# Patient Record
Sex: Male | Born: 1935 | Race: White | Hispanic: No | Marital: Married | Smoking: Former smoker
Health system: Southern US, Community
[De-identification: ages and names within clinical notes are randomized; demographics above are authoritative.]

## PROBLEM LIST (undated history)

## (undated) DIAGNOSIS — H9190 Unspecified hearing loss, unspecified ear: Secondary | ICD-10-CM

## (undated) DIAGNOSIS — F329 Major depressive disorder, single episode, unspecified: Secondary | ICD-10-CM

## (undated) DIAGNOSIS — H3530 Unspecified macular degeneration: Secondary | ICD-10-CM

## (undated) DIAGNOSIS — K2270 Barrett's esophagus without dysplasia: Secondary | ICD-10-CM

## (undated) DIAGNOSIS — K219 Gastro-esophageal reflux disease without esophagitis: Secondary | ICD-10-CM

## (undated) DIAGNOSIS — N400 Enlarged prostate without lower urinary tract symptoms: Secondary | ICD-10-CM

## (undated) DIAGNOSIS — G63 Polyneuropathy in diseases classified elsewhere: Secondary | ICD-10-CM

## (undated) DIAGNOSIS — S82202A Unspecified fracture of shaft of left tibia, initial encounter for closed fracture: Secondary | ICD-10-CM

## (undated) DIAGNOSIS — F419 Anxiety disorder, unspecified: Secondary | ICD-10-CM

## (undated) DIAGNOSIS — I714 Abdominal aortic aneurysm, without rupture, unspecified: Secondary | ICD-10-CM

## (undated) DIAGNOSIS — E785 Hyperlipidemia, unspecified: Secondary | ICD-10-CM

## (undated) DIAGNOSIS — M1990 Unspecified osteoarthritis, unspecified site: Secondary | ICD-10-CM

## (undated) DIAGNOSIS — I4891 Unspecified atrial fibrillation: Secondary | ICD-10-CM

## (undated) DIAGNOSIS — F32A Depression, unspecified: Secondary | ICD-10-CM

## (undated) DIAGNOSIS — Z87820 Personal history of traumatic brain injury: Secondary | ICD-10-CM

## (undated) DIAGNOSIS — R269 Unspecified abnormalities of gait and mobility: Secondary | ICD-10-CM

## (undated) DIAGNOSIS — G454 Transient global amnesia: Secondary | ICD-10-CM

## (undated) DIAGNOSIS — M4800 Spinal stenosis, site unspecified: Secondary | ICD-10-CM

## (undated) DIAGNOSIS — I10 Essential (primary) hypertension: Secondary | ICD-10-CM

## (undated) DIAGNOSIS — R42 Dizziness and giddiness: Secondary | ICD-10-CM

## (undated) DIAGNOSIS — S42001A Fracture of unspecified part of right clavicle, initial encounter for closed fracture: Secondary | ICD-10-CM

## (undated) HISTORY — PX: REFRACTIVE SURGERY: SHX103

## (undated) HISTORY — DX: Spinal stenosis, site unspecified: M48.00

## (undated) HISTORY — DX: Unspecified abnormalities of gait and mobility: R26.9

## (undated) HISTORY — DX: Barrett's esophagus without dysplasia: K22.70

## (undated) HISTORY — DX: Polyneuropathy in diseases classified elsewhere: G63

## (undated) HISTORY — PX: CATARACT EXTRACTION: SUR2

## (undated) HISTORY — DX: Benign prostatic hyperplasia without lower urinary tract symptoms: N40.0

## (undated) HISTORY — DX: Unspecified hearing loss, unspecified ear: H91.90

## (undated) HISTORY — DX: Fracture of unspecified part of right clavicle, initial encounter for closed fracture: S42.001A

## (undated) HISTORY — DX: Essential (primary) hypertension: I10

## (undated) HISTORY — PX: LAPAROSCOPIC RIGHT HEMI COLECTOMY: SHX5926

## (undated) HISTORY — DX: Unspecified osteoarthritis, unspecified site: M19.90

## (undated) HISTORY — DX: Major depressive disorder, single episode, unspecified: F32.9

## (undated) HISTORY — PX: APPENDECTOMY: SHX54

## (undated) HISTORY — DX: Transient global amnesia: G45.4

## (undated) HISTORY — DX: Abdominal aortic aneurysm, without rupture, unspecified: I71.40

## (undated) HISTORY — PX: LUMBAR LAMINECTOMY: SHX95

## (undated) HISTORY — PX: TONSILLECTOMY AND ADENOIDECTOMY: SUR1326

## (undated) HISTORY — DX: Unspecified macular degeneration: H35.30

## (undated) HISTORY — DX: Unspecified fracture of shaft of left tibia, initial encounter for closed fracture: S82.202A

## (undated) HISTORY — PX: KNEE SURGERY: SHX244

## (undated) HISTORY — DX: Anxiety disorder, unspecified: F41.9

## (undated) HISTORY — PX: WRIST FRACTURE SURGERY: SHX121

## (undated) HISTORY — PX: TOTAL HIP ARTHROPLASTY: SHX124

## (undated) HISTORY — DX: Hyperlipidemia, unspecified: E78.5

## (undated) HISTORY — DX: Unspecified atrial fibrillation: I48.91

## (undated) HISTORY — DX: Depression, unspecified: F32.A

## (undated) HISTORY — DX: Abdominal aortic aneurysm, without rupture: I71.4

## (undated) HISTORY — DX: Gastro-esophageal reflux disease without esophagitis: K21.9

## (undated) HISTORY — DX: Dizziness and giddiness: R42

## (undated) HISTORY — DX: Personal history of traumatic brain injury: Z87.820

---

## 2013-04-09 ENCOUNTER — Encounter (INDEPENDENT_AMBULATORY_CARE_PROVIDER_SITE_OTHER): Payer: Self-pay | Admitting: General Surgery

## 2013-04-09 ENCOUNTER — Ambulatory Visit (INDEPENDENT_AMBULATORY_CARE_PROVIDER_SITE_OTHER): Payer: Medicare Other | Admitting: General Surgery

## 2013-04-09 VITALS — BP 110/70 | HR 76 | Temp 98.2°F | Resp 14 | Ht 71.5 in | Wt 222.2 lb

## 2013-04-09 DIAGNOSIS — S31103A Unspecified open wound of abdominal wall, right lower quadrant without penetration into peritoneal cavity, initial encounter: Secondary | ICD-10-CM | POA: Insufficient documentation

## 2013-04-09 DIAGNOSIS — S31109A Unspecified open wound of abdominal wall, unspecified quadrant without penetration into peritoneal cavity, initial encounter: Secondary | ICD-10-CM

## 2013-04-09 MED ORDER — DOXYCYCLINE HYCLATE 100 MG PO TABS: 100.0000 mg | ORAL_TABLET | Freq: Two times a day (BID) | ORAL | Status: DC

## 2013-04-09 NOTE — Progress Notes (Signed)
Patient ID: Walter Bryant, male   DOB: Sep 12, 1930, 79 y.o.   MRN: [id number]  Chief Complaint  Patient presents with  . Follow-up    poss groin abscess    HPI Walter Bryant is a 79 y.o. male.   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 His history is significant in that in 2011 he underwent a right hip replacement in the [place] area. He had a problem with a chronic, draining, nonhealing wound. Eventually, a plastic surgeon had to take him back and debride the wound and close it. He was recently moved to [facility]. He and his internist noticed a right groin lump in the recent past prior to him moving.  He states he thinks he has a right inguinal hernia but this was something separate. It recently began draining serous material. It is for this reason he comes to the urgent office.  Past Medical History  Diagnosis Date  . Arthritis   . GERD (gastroesophageal reflux disease)   . Hyperlipidemia   . Hypertension     Past Surgical History  Procedure Laterality Date  . Tonsillectomy and adenoidectomy    . Knee surgery      both  knees replaced  . Wrist fracture surgery    . Total hip arthroplasty      right  . Appendectomy    . Cataract extraction    . Laparoscopic right hemi colectomy      History reviewed. No pertinent family history.  Social History History  Substance Use Topics  . Smoking status: Former Smoker    Quit date: 04/19/1966  . Smokeless tobacco: Not on file  . Alcohol Use: No    Allergies  Allergen Reactions  . Other     vicroyl sutures    Current Outpatient Prescriptions  Medication Sig Dispense Refill  . dabigatran (PRADAXA) 150 MG CAPS capsule Take 150 mg by mouth 2 (two) times daily.      . metoprolol succinate (TOPROL-XL) 50 MG 24 hr tablet Take 50 mg by mouth daily. Take with or immediately following a meal.      . omeprazole (PRILOSEC) 20 MG capsule Take 20 mg by mouth daily.       . pregabalin (LYRICA) 75 MG capsule Take 75 mg by mouth 4 (four) times daily as needed.      . ranitidine (ZANTAC) 150 MG tablet Take 150 mg by mouth 2 (two) times daily.      . tamsulosin (FLOMAX) 0.4 MG CAPS capsule Take 0.4 mg by mouth.      . doxycycline (VIBRA-TABS) 100 MG tablet Take 1 tablet (100 mg total) by mouth 2 (two) times daily.  42 tablet  1   No current facility-administered medications for this visit.    Review of Systems Review of Systems  Constitutional: Negative for fever and chills.  Gastrointestinal: Negative for abdominal pain.  Musculoskeletal: Positive for arthralgias.  Neurological: Positive for weakness ( right hip flexors).    Blood pressure 110/70, pulse 76, temperature 98.2 F (36.8 C), temperature source Temporal, resp. rate 14, height 5' 11.5" (1.816 m), weight 222 lb 3.2 oz (100.789 kg).  Physical Exam Physical Exam  Constitutional: He appears well-developed and well-nourished. No distress.  Genitourinary:  Is there is a 3-4 cm firm groin mass with indurated skin over it. There were 2 areas draining yellowish slightly odorous fluid. The mass extends toward the right hip area. No  obvious hernia on exam in the supine position.  Musculoskeletal:  There is a longitudinal right hip scar as well as a horizontal right hip scar that extends into the groin.    Data Reviewed none  Assessment    Right groin mass that is draining. It is very close to the transverse extension of his wound. I am concerned that he may have an infected prosthetic. He was placed on doxycycline by somebody from urgent care.     Plan    Assessment of wound fluid for culture. Continue doxycycline. Order CT of the pelvis. Return in 3-4 weeks.   Keondrick Dilks J 04/09/2013, 4:54 PM

## 2013-04-09 NOTE — Patient Instructions (Signed)
Clean the area twice a day.

## 2013-04-10 ENCOUNTER — Other Ambulatory Visit: Payer: Self-pay

## 2013-04-10 ENCOUNTER — Ambulatory Visit
Admission: RE | Admit: 2013-04-10 | Discharge: 2013-04-10 | Disposition: A | Discharge status: Home / Self Care | Payer: Medicare Other | Source: Ambulatory Visit | Attending: General Surgery | Admitting: General Surgery

## 2013-04-10 DIAGNOSIS — S31109A Unspecified open wound of abdominal wall, unspecified quadrant without penetration into peritoneal cavity, initial encounter: Secondary | ICD-10-CM

## 2013-04-10 MED ORDER — IOHEXOL 300 MG/ML  SOLN
125.0000 mL | Freq: Once | INTRAMUSCULAR | Status: AC | PRN
  Administered 2013-04-10: 125 mL via INTRAVENOUS

## 2013-04-12 LAB — WOUND CULTURE
Gram Stain: NONE SEEN
Gram Stain: NONE SEEN

## 2013-04-13 ENCOUNTER — Telehealth (INDEPENDENT_AMBULATORY_CARE_PROVIDER_SITE_OTHER): Payer: Self-pay

## 2013-04-13 NOTE — Telephone Encounter (Signed)
Pt notified of cytology results:  No staph or strep - no growth.  Reminded of his f/u appt with Dr. Abbey Chatters on 04/27/13.

## 2013-04-16 ENCOUNTER — Telehealth (INDEPENDENT_AMBULATORY_CARE_PROVIDER_SITE_OTHER): Payer: Self-pay

## 2013-04-16 NOTE — Telephone Encounter (Signed)
Pt's CT abdomen and pelvis from 04/09/13 was reviewed by Dr. Michaell Cowing in Dr. Maris Berger absence.  Films show no RIH or any other type of hernia.  Imaging does show a deep fluid collection in the right inguinal region which touches the right hip prosthesis.  Dr. Michaell Cowing is recommending that the patient see his orthopaedist to r/o infection of the prosthesis.  Please make patient aware that message will be sent to Dr. Abbey Chatters, and he will be aware of Dr. Michaell Cowing' instructions.  If pt has any other questions please contact Sacha Topor.

## 2013-04-16 NOTE — Telephone Encounter (Signed)
Pt returned call from [place] regarding his CT.  Gave him the full message and he understands to call his orthopaedist.

## 2013-04-17 NOTE — Telephone Encounter (Signed)
Agree with assessment and recommendation of Dr. Michaell Cowing

## 2013-04-24 DIAGNOSIS — M6520 Calcific tendinitis, unspecified site: Secondary | ICD-10-CM | POA: Diagnosis not present

## 2013-04-24 DIAGNOSIS — M722 Plantar fascial fibromatosis: Secondary | ICD-10-CM | POA: Diagnosis not present

## 2013-04-24 DIAGNOSIS — M19079 Primary osteoarthritis, unspecified ankle and foot: Secondary | ICD-10-CM | POA: Diagnosis not present

## 2013-04-24 DIAGNOSIS — M7730 Calcaneal spur, unspecified foot: Secondary | ICD-10-CM | POA: Diagnosis not present

## 2013-04-26 ENCOUNTER — Encounter (INDEPENDENT_AMBULATORY_CARE_PROVIDER_SITE_OTHER): Payer: Self-pay | Admitting: General Surgery

## 2013-04-26 NOTE — Progress Notes (Unsigned)
Patient ID: Walter Bryant, male   DOB: October 19, 1930, 80 y.o.   MRN: [id number] We spoke today about his CT scan and the findings (below). I have her view the report and the images. Dr. Johney Maine reviewed as well and had recommended he see an orthopedic surgeon which I agreed with. He recently saw an orthopedic surgeon at [facility] for a foot problem. The surgeon referred him to a orthopedic surgeon at [facility]  Since this is going to be an orthopedic problem, I do not think I need to continue seeing him and he is in agreement with that.   FINDINGS:  5.1 x 5.0 cm infrarenal abdominal aortic aneurysm (series 2/ image  7). Atherosclerotic calcifications of the abdominal aorta and branch  vessels.  Right lower pole renal cysts, incompletely visualized.  Visualized bowel is unremarkable. Suspected prior appendectomy.  Extensive sigmoid diverticulosis, without associated inflammatory  changes.  No pelvic ascites.  No suspicious pelvic lymphadenopathy.  Prostate is partially obscured by streak artifact but is grossly  unremarkable.  Bladder is partially obscured by streak artifact but is grossly  unremarkable.  Degenerative changes of the lower lumbar spine. Right hip  arthroplasty.  Enlargement of the right pectineus muscle with suspected 2.4 x 4.4  cm intramuscular fluid collection, poorly visualized secondary to  streak artifact. Adjacent 3.2 x 3.5 cm soft tissue density lesion in  the right inguinal region (series 2/ image 46), also favored to  reflect a fluid collection, possibly complex, although with  artifactually elevated CT density due to streak artifact. Patent  tract between the fluid collection in the right inguinal region and  skin surface (series 2/ images 51 and 55).  IMPRESSION:  Right total hip arthroplasty.  Enlargement of the right pectineus muscle with suspected 2.4 x 4.4  cm intramuscular fluid collection, poorly visualized.  Adjacent 3.2 x 3.5 cm soft tissue density lesion in  the right  inguinal region, favored to reflect a complex fluid collection, with  patent draining tract to the skin surface.  5.1 x 5.0 cm infrarenal abdominal aortic aneurysm.

## 2013-04-27 ENCOUNTER — Encounter (INDEPENDENT_AMBULATORY_CARE_PROVIDER_SITE_OTHER): Payer: Medicare Other | Admitting: General Surgery

## 2013-04-27 DIAGNOSIS — T8450XA Infection and inflammatory reaction due to unspecified internal joint prosthesis, initial encounter: Secondary | ICD-10-CM | POA: Diagnosis not present

## 2013-04-30 ENCOUNTER — Other Ambulatory Visit (INDEPENDENT_AMBULATORY_CARE_PROVIDER_SITE_OTHER): Payer: Self-pay | Admitting: General Surgery

## 2013-04-30 ENCOUNTER — Telehealth (INDEPENDENT_AMBULATORY_CARE_PROVIDER_SITE_OTHER): Payer: Self-pay | Admitting: General Surgery

## 2013-04-30 DIAGNOSIS — I714 Abdominal aortic aneurysm, without rupture, unspecified: Secondary | ICD-10-CM

## 2013-04-30 NOTE — Telephone Encounter (Signed)
I spoke with him regarding his AAA.  He was followed by a vascular surgeon at [facility] prior to moving to [place].  I suggested a referral to a vascular surgeon for continued monitoring and he is in agreement.  We will make the referral.

## 2013-05-02 DIAGNOSIS — Z96649 Presence of unspecified artificial hip joint: Secondary | ICD-10-CM | POA: Diagnosis not present

## 2013-05-02 DIAGNOSIS — T8189XA Other complications of procedures, not elsewhere classified, initial encounter: Secondary | ICD-10-CM | POA: Diagnosis not present

## 2013-05-02 DIAGNOSIS — Z471 Aftercare following joint replacement surgery: Secondary | ICD-10-CM | POA: Diagnosis not present

## 2013-05-10 DIAGNOSIS — M216X9 Other acquired deformities of unspecified foot: Secondary | ICD-10-CM | POA: Diagnosis not present

## 2013-05-16 DIAGNOSIS — Z01818 Encounter for other preprocedural examination: Secondary | ICD-10-CM | POA: Diagnosis not present

## 2013-05-16 DIAGNOSIS — I4891 Unspecified atrial fibrillation: Secondary | ICD-10-CM | POA: Diagnosis not present

## 2013-05-21 DIAGNOSIS — T8450XA Infection and inflammatory reaction due to unspecified internal joint prosthesis, initial encounter: Secondary | ICD-10-CM | POA: Diagnosis not present

## 2013-05-21 DIAGNOSIS — H9190 Unspecified hearing loss, unspecified ear: Secondary | ICD-10-CM | POA: Diagnosis not present

## 2013-05-21 DIAGNOSIS — I714 Abdominal aortic aneurysm, without rupture, unspecified: Secondary | ICD-10-CM | POA: Diagnosis not present

## 2013-05-21 DIAGNOSIS — T8189XA Other complications of procedures, not elsewhere classified, initial encounter: Secondary | ICD-10-CM | POA: Diagnosis not present

## 2013-05-21 DIAGNOSIS — E785 Hyperlipidemia, unspecified: Secondary | ICD-10-CM | POA: Diagnosis not present

## 2013-05-21 DIAGNOSIS — F329 Major depressive disorder, single episode, unspecified: Secondary | ICD-10-CM | POA: Diagnosis not present

## 2013-05-21 DIAGNOSIS — G609 Hereditary and idiopathic neuropathy, unspecified: Secondary | ICD-10-CM | POA: Diagnosis not present

## 2013-05-21 DIAGNOSIS — I4891 Unspecified atrial fibrillation: Secondary | ICD-10-CM | POA: Diagnosis not present

## 2013-05-21 DIAGNOSIS — M1990 Unspecified osteoarthritis, unspecified site: Secondary | ICD-10-CM | POA: Diagnosis not present

## 2013-05-21 DIAGNOSIS — F3289 Other specified depressive episodes: Secondary | ICD-10-CM | POA: Diagnosis not present

## 2013-05-21 DIAGNOSIS — M4800 Spinal stenosis, site unspecified: Secondary | ICD-10-CM | POA: Diagnosis not present

## 2013-05-21 DIAGNOSIS — Z96649 Presence of unspecified artificial hip joint: Secondary | ICD-10-CM | POA: Diagnosis not present

## 2013-05-21 DIAGNOSIS — N400 Enlarged prostate without lower urinary tract symptoms: Secondary | ICD-10-CM | POA: Diagnosis not present

## 2013-05-21 DIAGNOSIS — K4090 Unilateral inguinal hernia, without obstruction or gangrene, not specified as recurrent: Secondary | ICD-10-CM | POA: Diagnosis not present

## 2013-05-21 DIAGNOSIS — K2270 Barrett's esophagus without dysplasia: Secondary | ICD-10-CM | POA: Diagnosis not present

## 2013-05-21 DIAGNOSIS — Z951 Presence of aortocoronary bypass graft: Secondary | ICD-10-CM | POA: Diagnosis not present

## 2013-05-21 DIAGNOSIS — R42 Dizziness and giddiness: Secondary | ICD-10-CM | POA: Diagnosis not present

## 2013-05-21 DIAGNOSIS — F411 Generalized anxiety disorder: Secondary | ICD-10-CM | POA: Diagnosis not present

## 2013-05-21 DIAGNOSIS — R7309 Other abnormal glucose: Secondary | ICD-10-CM | POA: Diagnosis not present

## 2013-05-21 DIAGNOSIS — H3530 Unspecified macular degeneration: Secondary | ICD-10-CM | POA: Diagnosis not present

## 2013-05-22 DIAGNOSIS — I4891 Unspecified atrial fibrillation: Secondary | ICD-10-CM | POA: Diagnosis not present

## 2013-05-22 DIAGNOSIS — L02219 Cutaneous abscess of trunk, unspecified: Secondary | ICD-10-CM | POA: Diagnosis not present

## 2013-05-22 DIAGNOSIS — R42 Dizziness and giddiness: Secondary | ICD-10-CM | POA: Diagnosis not present

## 2013-05-22 DIAGNOSIS — M2580 Other specified joint disorders, unspecified joint: Secondary | ICD-10-CM | POA: Diagnosis not present

## 2013-05-22 DIAGNOSIS — Z96649 Presence of unspecified artificial hip joint: Secondary | ICD-10-CM | POA: Diagnosis not present

## 2013-05-22 DIAGNOSIS — M4800 Spinal stenosis, site unspecified: Secondary | ICD-10-CM | POA: Diagnosis not present

## 2013-05-22 DIAGNOSIS — T8189XA Other complications of procedures, not elsewhere classified, initial encounter: Secondary | ICD-10-CM | POA: Diagnosis not present

## 2013-05-22 DIAGNOSIS — M1990 Unspecified osteoarthritis, unspecified site: Secondary | ICD-10-CM | POA: Diagnosis not present

## 2013-05-22 DIAGNOSIS — T8450XA Infection and inflammatory reaction due to unspecified internal joint prosthesis, initial encounter: Secondary | ICD-10-CM | POA: Diagnosis not present

## 2013-05-23 DIAGNOSIS — E7800 Pure hypercholesterolemia, unspecified: Secondary | ICD-10-CM | POA: Diagnosis not present

## 2013-05-23 DIAGNOSIS — I4891 Unspecified atrial fibrillation: Secondary | ICD-10-CM | POA: Diagnosis not present

## 2013-05-23 DIAGNOSIS — S31109A Unspecified open wound of abdominal wall, unspecified quadrant without penetration into peritoneal cavity, initial encounter: Secondary | ICD-10-CM | POA: Diagnosis not present

## 2013-05-23 DIAGNOSIS — N400 Enlarged prostate without lower urinary tract symptoms: Secondary | ICD-10-CM | POA: Diagnosis not present

## 2013-05-29 DIAGNOSIS — Z96659 Presence of unspecified artificial knee joint: Secondary | ICD-10-CM | POA: Diagnosis not present

## 2013-05-29 DIAGNOSIS — M2580 Other specified joint disorders, unspecified joint: Secondary | ICD-10-CM | POA: Diagnosis not present

## 2013-05-29 DIAGNOSIS — T889XXS Complication of surgical and medical care, unspecified, sequela: Secondary | ICD-10-CM | POA: Diagnosis not present

## 2013-05-29 DIAGNOSIS — T8450XA Infection and inflammatory reaction due to unspecified internal joint prosthesis, initial encounter: Secondary | ICD-10-CM | POA: Diagnosis not present

## 2013-05-30 ENCOUNTER — Encounter: Payer: Medicare Other | Admitting: Vascular Surgery

## 2013-05-30 DIAGNOSIS — T8189XA Other complications of procedures, not elsewhere classified, initial encounter: Secondary | ICD-10-CM | POA: Diagnosis not present

## 2013-05-30 DIAGNOSIS — B964 Proteus (mirabilis) (morganii) as the cause of diseases classified elsewhere: Secondary | ICD-10-CM | POA: Diagnosis not present

## 2013-05-30 DIAGNOSIS — T8450XA Infection and inflammatory reaction due to unspecified internal joint prosthesis, initial encounter: Secondary | ICD-10-CM | POA: Diagnosis not present

## 2013-05-31 ENCOUNTER — Encounter: Payer: Medicare Other | Admitting: Vascular Surgery

## 2013-06-06 DIAGNOSIS — T8189XA Other complications of procedures, not elsewhere classified, initial encounter: Secondary | ICD-10-CM | POA: Diagnosis not present

## 2013-06-06 DIAGNOSIS — T8450XA Infection and inflammatory reaction due to unspecified internal joint prosthesis, initial encounter: Secondary | ICD-10-CM | POA: Diagnosis not present

## 2013-06-06 DIAGNOSIS — Z96649 Presence of unspecified artificial hip joint: Secondary | ICD-10-CM | POA: Diagnosis not present

## 2013-06-07 DIAGNOSIS — M216X9 Other acquired deformities of unspecified foot: Secondary | ICD-10-CM | POA: Diagnosis not present

## 2013-06-07 DIAGNOSIS — Z96649 Presence of unspecified artificial hip joint: Secondary | ICD-10-CM | POA: Diagnosis not present

## 2013-06-07 DIAGNOSIS — T8189XA Other complications of procedures, not elsewhere classified, initial encounter: Secondary | ICD-10-CM | POA: Diagnosis not present

## 2013-06-07 DIAGNOSIS — T8450XA Infection and inflammatory reaction due to unspecified internal joint prosthesis, initial encounter: Secondary | ICD-10-CM | POA: Diagnosis not present

## 2013-06-07 DIAGNOSIS — L84 Corns and callosities: Secondary | ICD-10-CM | POA: Diagnosis not present

## 2013-06-07 DIAGNOSIS — L97409 Non-pressure chronic ulcer of unspecified heel and midfoot with unspecified severity: Secondary | ICD-10-CM | POA: Diagnosis not present

## 2013-06-08 DIAGNOSIS — T8450XA Infection and inflammatory reaction due to unspecified internal joint prosthesis, initial encounter: Secondary | ICD-10-CM | POA: Diagnosis not present

## 2013-06-08 DIAGNOSIS — T8189XA Other complications of procedures, not elsewhere classified, initial encounter: Secondary | ICD-10-CM | POA: Diagnosis not present

## 2013-06-08 DIAGNOSIS — Z96649 Presence of unspecified artificial hip joint: Secondary | ICD-10-CM | POA: Diagnosis not present

## 2013-06-13 DIAGNOSIS — L0291 Cutaneous abscess, unspecified: Secondary | ICD-10-CM | POA: Diagnosis not present

## 2013-06-13 DIAGNOSIS — Z96649 Presence of unspecified artificial hip joint: Secondary | ICD-10-CM | POA: Diagnosis not present

## 2013-06-13 DIAGNOSIS — M48061 Spinal stenosis, lumbar region without neurogenic claudication: Secondary | ICD-10-CM | POA: Diagnosis not present

## 2013-06-13 DIAGNOSIS — I714 Abdominal aortic aneurysm, without rupture, unspecified: Secondary | ICD-10-CM | POA: Diagnosis not present

## 2013-06-13 DIAGNOSIS — T8189XA Other complications of procedures, not elsewhere classified, initial encounter: Secondary | ICD-10-CM | POA: Diagnosis not present

## 2013-06-13 DIAGNOSIS — L0390 Cellulitis, unspecified: Secondary | ICD-10-CM | POA: Diagnosis not present

## 2013-06-13 DIAGNOSIS — I4891 Unspecified atrial fibrillation: Secondary | ICD-10-CM | POA: Diagnosis not present

## 2013-06-13 DIAGNOSIS — T8450XA Infection and inflammatory reaction due to unspecified internal joint prosthesis, initial encounter: Secondary | ICD-10-CM | POA: Diagnosis not present

## 2013-06-15 DIAGNOSIS — T8450XA Infection and inflammatory reaction due to unspecified internal joint prosthesis, initial encounter: Secondary | ICD-10-CM | POA: Diagnosis not present

## 2013-06-15 DIAGNOSIS — T8189XA Other complications of procedures, not elsewhere classified, initial encounter: Secondary | ICD-10-CM | POA: Diagnosis not present

## 2013-06-15 DIAGNOSIS — Z96649 Presence of unspecified artificial hip joint: Secondary | ICD-10-CM | POA: Diagnosis not present

## 2013-06-19 DIAGNOSIS — T8189XA Other complications of procedures, not elsewhere classified, initial encounter: Secondary | ICD-10-CM | POA: Diagnosis not present

## 2013-06-19 DIAGNOSIS — T8450XA Infection and inflammatory reaction due to unspecified internal joint prosthesis, initial encounter: Secondary | ICD-10-CM | POA: Diagnosis not present

## 2013-06-19 DIAGNOSIS — Z96649 Presence of unspecified artificial hip joint: Secondary | ICD-10-CM | POA: Diagnosis not present

## 2013-06-26 DIAGNOSIS — T8450XA Infection and inflammatory reaction due to unspecified internal joint prosthesis, initial encounter: Secondary | ICD-10-CM | POA: Diagnosis not present

## 2013-06-26 DIAGNOSIS — T8189XA Other complications of procedures, not elsewhere classified, initial encounter: Secondary | ICD-10-CM | POA: Diagnosis not present

## 2013-06-26 DIAGNOSIS — Z96649 Presence of unspecified artificial hip joint: Secondary | ICD-10-CM | POA: Diagnosis not present

## 2013-06-27 DIAGNOSIS — T8450XA Infection and inflammatory reaction due to unspecified internal joint prosthesis, initial encounter: Secondary | ICD-10-CM | POA: Diagnosis not present

## 2013-06-27 DIAGNOSIS — T8189XA Other complications of procedures, not elsewhere classified, initial encounter: Secondary | ICD-10-CM | POA: Diagnosis not present

## 2013-06-27 DIAGNOSIS — Z96649 Presence of unspecified artificial hip joint: Secondary | ICD-10-CM | POA: Diagnosis not present

## 2013-06-28 DIAGNOSIS — I4891 Unspecified atrial fibrillation: Secondary | ICD-10-CM | POA: Diagnosis not present

## 2013-06-28 DIAGNOSIS — I714 Abdominal aortic aneurysm, without rupture, unspecified: Secondary | ICD-10-CM | POA: Diagnosis not present

## 2013-06-28 DIAGNOSIS — E785 Hyperlipidemia, unspecified: Secondary | ICD-10-CM | POA: Diagnosis not present

## 2013-06-28 DIAGNOSIS — M4800 Spinal stenosis, site unspecified: Secondary | ICD-10-CM | POA: Diagnosis not present

## 2013-06-28 DIAGNOSIS — I10 Essential (primary) hypertension: Secondary | ICD-10-CM | POA: Diagnosis not present

## 2013-06-28 DIAGNOSIS — M216X9 Other acquired deformities of unspecified foot: Secondary | ICD-10-CM | POA: Diagnosis not present

## 2013-07-03 DIAGNOSIS — I4891 Unspecified atrial fibrillation: Secondary | ICD-10-CM | POA: Diagnosis not present

## 2013-07-03 DIAGNOSIS — T8450XA Infection and inflammatory reaction due to unspecified internal joint prosthesis, initial encounter: Secondary | ICD-10-CM | POA: Diagnosis not present

## 2013-07-03 DIAGNOSIS — B3789 Other sites of candidiasis: Secondary | ICD-10-CM | POA: Diagnosis not present

## 2013-07-03 DIAGNOSIS — R9431 Abnormal electrocardiogram [ECG] [EKG]: Secondary | ICD-10-CM | POA: Diagnosis not present

## 2013-07-03 DIAGNOSIS — Z5181 Encounter for therapeutic drug level monitoring: Secondary | ICD-10-CM | POA: Diagnosis not present

## 2013-07-04 DIAGNOSIS — Z96649 Presence of unspecified artificial hip joint: Secondary | ICD-10-CM | POA: Diagnosis not present

## 2013-07-04 DIAGNOSIS — T8189XA Other complications of procedures, not elsewhere classified, initial encounter: Secondary | ICD-10-CM | POA: Diagnosis not present

## 2013-07-04 DIAGNOSIS — T8450XA Infection and inflammatory reaction due to unspecified internal joint prosthesis, initial encounter: Secondary | ICD-10-CM | POA: Diagnosis not present

## 2013-07-11 DIAGNOSIS — Z96649 Presence of unspecified artificial hip joint: Secondary | ICD-10-CM | POA: Diagnosis not present

## 2013-07-11 DIAGNOSIS — T8450XA Infection and inflammatory reaction due to unspecified internal joint prosthesis, initial encounter: Secondary | ICD-10-CM | POA: Diagnosis not present

## 2013-07-11 DIAGNOSIS — T8189XA Other complications of procedures, not elsewhere classified, initial encounter: Secondary | ICD-10-CM | POA: Diagnosis not present

## 2013-07-12 DIAGNOSIS — H353190 Nonexudative age-related macular degeneration, unspecified eye, stage unspecified: Secondary | ICD-10-CM | POA: Diagnosis not present

## 2013-07-12 DIAGNOSIS — H43819 Vitreous degeneration, unspecified eye: Secondary | ICD-10-CM | POA: Diagnosis not present

## 2013-07-12 DIAGNOSIS — H53419 Scotoma involving central area, unspecified eye: Secondary | ICD-10-CM | POA: Diagnosis not present

## 2013-07-12 DIAGNOSIS — L97409 Non-pressure chronic ulcer of unspecified heel and midfoot with unspecified severity: Secondary | ICD-10-CM | POA: Diagnosis not present

## 2013-07-17 DIAGNOSIS — Z96649 Presence of unspecified artificial hip joint: Secondary | ICD-10-CM | POA: Diagnosis not present

## 2013-07-17 DIAGNOSIS — T8450XA Infection and inflammatory reaction due to unspecified internal joint prosthesis, initial encounter: Secondary | ICD-10-CM | POA: Diagnosis not present

## 2013-07-17 DIAGNOSIS — T8189XA Other complications of procedures, not elsewhere classified, initial encounter: Secondary | ICD-10-CM | POA: Diagnosis not present

## 2013-07-25 DIAGNOSIS — L02219 Cutaneous abscess of trunk, unspecified: Secondary | ICD-10-CM | POA: Diagnosis not present

## 2013-07-25 DIAGNOSIS — G609 Hereditary and idiopathic neuropathy, unspecified: Secondary | ICD-10-CM | POA: Diagnosis not present

## 2013-07-25 DIAGNOSIS — M2580 Other specified joint disorders, unspecified joint: Secondary | ICD-10-CM | POA: Diagnosis not present

## 2013-07-25 DIAGNOSIS — T8189XA Other complications of procedures, not elsewhere classified, initial encounter: Secondary | ICD-10-CM | POA: Diagnosis not present

## 2013-07-25 DIAGNOSIS — L03319 Cellulitis of trunk, unspecified: Secondary | ICD-10-CM | POA: Diagnosis not present

## 2013-07-26 DIAGNOSIS — T8189XA Other complications of procedures, not elsewhere classified, initial encounter: Secondary | ICD-10-CM | POA: Diagnosis not present

## 2013-07-26 DIAGNOSIS — Z96649 Presence of unspecified artificial hip joint: Secondary | ICD-10-CM | POA: Diagnosis not present

## 2013-07-26 DIAGNOSIS — T8450XA Infection and inflammatory reaction due to unspecified internal joint prosthesis, initial encounter: Secondary | ICD-10-CM | POA: Diagnosis not present

## 2013-08-02 DIAGNOSIS — R799 Abnormal finding of blood chemistry, unspecified: Secondary | ICD-10-CM | POA: Diagnosis not present

## 2013-08-03 DIAGNOSIS — Z96649 Presence of unspecified artificial hip joint: Secondary | ICD-10-CM | POA: Diagnosis not present

## 2013-08-03 DIAGNOSIS — T8450XA Infection and inflammatory reaction due to unspecified internal joint prosthesis, initial encounter: Secondary | ICD-10-CM | POA: Diagnosis not present

## 2013-08-03 DIAGNOSIS — T8189XA Other complications of procedures, not elsewhere classified, initial encounter: Secondary | ICD-10-CM | POA: Diagnosis not present

## 2013-08-05 DIAGNOSIS — T8450XA Infection and inflammatory reaction due to unspecified internal joint prosthesis, initial encounter: Secondary | ICD-10-CM | POA: Diagnosis not present

## 2013-08-05 DIAGNOSIS — Z96649 Presence of unspecified artificial hip joint: Secondary | ICD-10-CM | POA: Diagnosis not present

## 2013-08-05 DIAGNOSIS — T8189XA Other complications of procedures, not elsewhere classified, initial encounter: Secondary | ICD-10-CM | POA: Diagnosis not present

## 2013-08-08 DIAGNOSIS — T8450XA Infection and inflammatory reaction due to unspecified internal joint prosthesis, initial encounter: Secondary | ICD-10-CM | POA: Diagnosis not present

## 2013-08-08 DIAGNOSIS — T8189XA Other complications of procedures, not elsewhere classified, initial encounter: Secondary | ICD-10-CM | POA: Diagnosis not present

## 2013-08-08 DIAGNOSIS — Z96649 Presence of unspecified artificial hip joint: Secondary | ICD-10-CM | POA: Diagnosis not present

## 2013-08-09 DIAGNOSIS — L97409 Non-pressure chronic ulcer of unspecified heel and midfoot with unspecified severity: Secondary | ICD-10-CM | POA: Diagnosis not present

## 2013-08-15 DIAGNOSIS — T8189XA Other complications of procedures, not elsewhere classified, initial encounter: Secondary | ICD-10-CM | POA: Diagnosis not present

## 2013-08-15 DIAGNOSIS — T8450XA Infection and inflammatory reaction due to unspecified internal joint prosthesis, initial encounter: Secondary | ICD-10-CM | POA: Diagnosis not present

## 2013-08-15 DIAGNOSIS — Z96649 Presence of unspecified artificial hip joint: Secondary | ICD-10-CM | POA: Diagnosis not present

## 2013-08-22 DIAGNOSIS — Z96649 Presence of unspecified artificial hip joint: Secondary | ICD-10-CM | POA: Diagnosis not present

## 2013-08-22 DIAGNOSIS — T889XXS Complication of surgical and medical care, unspecified, sequela: Secondary | ICD-10-CM | POA: Diagnosis not present

## 2013-08-22 DIAGNOSIS — T8189XA Other complications of procedures, not elsewhere classified, initial encounter: Secondary | ICD-10-CM | POA: Diagnosis not present

## 2013-08-22 DIAGNOSIS — T8450XA Infection and inflammatory reaction due to unspecified internal joint prosthesis, initial encounter: Secondary | ICD-10-CM | POA: Diagnosis not present

## 2013-08-23 DIAGNOSIS — Z96649 Presence of unspecified artificial hip joint: Secondary | ICD-10-CM | POA: Diagnosis not present

## 2013-08-23 DIAGNOSIS — T8189XA Other complications of procedures, not elsewhere classified, initial encounter: Secondary | ICD-10-CM | POA: Diagnosis not present

## 2013-08-23 DIAGNOSIS — T8450XA Infection and inflammatory reaction due to unspecified internal joint prosthesis, initial encounter: Secondary | ICD-10-CM | POA: Diagnosis not present

## 2013-08-24 DIAGNOSIS — L570 Actinic keratosis: Secondary | ICD-10-CM | POA: Diagnosis not present

## 2013-08-29 DIAGNOSIS — I714 Abdominal aortic aneurysm, without rupture, unspecified: Secondary | ICD-10-CM | POA: Diagnosis not present

## 2013-08-29 DIAGNOSIS — I719 Aortic aneurysm of unspecified site, without rupture: Secondary | ICD-10-CM | POA: Diagnosis not present

## 2013-08-29 DIAGNOSIS — Z96649 Presence of unspecified artificial hip joint: Secondary | ICD-10-CM | POA: Diagnosis not present

## 2013-08-30 DIAGNOSIS — T8189XA Other complications of procedures, not elsewhere classified, initial encounter: Secondary | ICD-10-CM | POA: Diagnosis not present

## 2013-08-30 DIAGNOSIS — Z96649 Presence of unspecified artificial hip joint: Secondary | ICD-10-CM | POA: Diagnosis not present

## 2013-08-30 DIAGNOSIS — T8450XA Infection and inflammatory reaction due to unspecified internal joint prosthesis, initial encounter: Secondary | ICD-10-CM | POA: Diagnosis not present

## 2013-09-04 DIAGNOSIS — R21 Rash and other nonspecific skin eruption: Secondary | ICD-10-CM | POA: Diagnosis not present

## 2013-09-05 DIAGNOSIS — Z96649 Presence of unspecified artificial hip joint: Secondary | ICD-10-CM | POA: Diagnosis not present

## 2013-09-05 DIAGNOSIS — T8450XA Infection and inflammatory reaction due to unspecified internal joint prosthesis, initial encounter: Secondary | ICD-10-CM | POA: Diagnosis not present

## 2013-09-05 DIAGNOSIS — T8189XA Other complications of procedures, not elsewhere classified, initial encounter: Secondary | ICD-10-CM | POA: Diagnosis not present

## 2013-09-12 DIAGNOSIS — Z96649 Presence of unspecified artificial hip joint: Secondary | ICD-10-CM | POA: Diagnosis not present

## 2013-09-12 DIAGNOSIS — T8450XA Infection and inflammatory reaction due to unspecified internal joint prosthesis, initial encounter: Secondary | ICD-10-CM | POA: Diagnosis not present

## 2013-09-12 DIAGNOSIS — T8189XA Other complications of procedures, not elsewhere classified, initial encounter: Secondary | ICD-10-CM | POA: Diagnosis not present

## 2013-09-13 DIAGNOSIS — L97409 Non-pressure chronic ulcer of unspecified heel and midfoot with unspecified severity: Secondary | ICD-10-CM | POA: Diagnosis not present

## 2013-09-18 DIAGNOSIS — L570 Actinic keratosis: Secondary | ICD-10-CM | POA: Diagnosis not present

## 2013-09-19 DIAGNOSIS — T8189XA Other complications of procedures, not elsewhere classified, initial encounter: Secondary | ICD-10-CM | POA: Diagnosis not present

## 2013-09-19 DIAGNOSIS — T8450XA Infection and inflammatory reaction due to unspecified internal joint prosthesis, initial encounter: Secondary | ICD-10-CM | POA: Diagnosis not present

## 2013-09-19 DIAGNOSIS — Z96649 Presence of unspecified artificial hip joint: Secondary | ICD-10-CM | POA: Diagnosis not present

## 2013-09-20 DIAGNOSIS — Z5189 Encounter for other specified aftercare: Secondary | ICD-10-CM | POA: Diagnosis not present

## 2013-09-20 DIAGNOSIS — T8450XA Infection and inflammatory reaction due to unspecified internal joint prosthesis, initial encounter: Secondary | ICD-10-CM | POA: Diagnosis not present

## 2013-09-20 DIAGNOSIS — Z5181 Encounter for therapeutic drug level monitoring: Secondary | ICD-10-CM | POA: Diagnosis not present

## 2013-09-27 DIAGNOSIS — T8450XA Infection and inflammatory reaction due to unspecified internal joint prosthesis, initial encounter: Secondary | ICD-10-CM | POA: Diagnosis not present

## 2013-09-27 DIAGNOSIS — Z96649 Presence of unspecified artificial hip joint: Secondary | ICD-10-CM | POA: Diagnosis not present

## 2013-09-27 DIAGNOSIS — T8189XA Other complications of procedures, not elsewhere classified, initial encounter: Secondary | ICD-10-CM | POA: Diagnosis not present

## 2013-10-02 DIAGNOSIS — E785 Hyperlipidemia, unspecified: Secondary | ICD-10-CM | POA: Diagnosis not present

## 2013-10-02 DIAGNOSIS — I714 Abdominal aortic aneurysm, without rupture, unspecified: Secondary | ICD-10-CM | POA: Diagnosis not present

## 2013-10-02 DIAGNOSIS — T8450XA Infection and inflammatory reaction due to unspecified internal joint prosthesis, initial encounter: Secondary | ICD-10-CM | POA: Diagnosis not present

## 2013-10-02 DIAGNOSIS — T8189XA Other complications of procedures, not elsewhere classified, initial encounter: Secondary | ICD-10-CM | POA: Diagnosis not present

## 2013-10-02 DIAGNOSIS — I10 Essential (primary) hypertension: Secondary | ICD-10-CM | POA: Diagnosis not present

## 2013-10-02 DIAGNOSIS — Z96649 Presence of unspecified artificial hip joint: Secondary | ICD-10-CM | POA: Diagnosis not present

## 2013-10-02 DIAGNOSIS — I4891 Unspecified atrial fibrillation: Secondary | ICD-10-CM | POA: Diagnosis not present

## 2013-10-04 DIAGNOSIS — S060XAA Concussion with loss of consciousness status unknown, initial encounter: Secondary | ICD-10-CM | POA: Diagnosis not present

## 2013-10-04 DIAGNOSIS — H903 Sensorineural hearing loss, bilateral: Secondary | ICD-10-CM | POA: Diagnosis not present

## 2013-10-04 DIAGNOSIS — S060X9A Concussion with loss of consciousness of unspecified duration, initial encounter: Secondary | ICD-10-CM | POA: Diagnosis not present

## 2013-10-04 DIAGNOSIS — Z96649 Presence of unspecified artificial hip joint: Secondary | ICD-10-CM | POA: Diagnosis not present

## 2013-10-04 DIAGNOSIS — T8189XA Other complications of procedures, not elsewhere classified, initial encounter: Secondary | ICD-10-CM | POA: Diagnosis not present

## 2013-10-09 DIAGNOSIS — Z5189 Encounter for other specified aftercare: Secondary | ICD-10-CM | POA: Diagnosis not present

## 2013-10-09 DIAGNOSIS — B3789 Other sites of candidiasis: Secondary | ICD-10-CM | POA: Diagnosis not present

## 2013-10-10 DIAGNOSIS — T889XXS Complication of surgical and medical care, unspecified, sequela: Secondary | ICD-10-CM | POA: Diagnosis not present

## 2013-10-10 DIAGNOSIS — T8189XA Other complications of procedures, not elsewhere classified, initial encounter: Secondary | ICD-10-CM | POA: Diagnosis not present

## 2013-10-10 DIAGNOSIS — Z96649 Presence of unspecified artificial hip joint: Secondary | ICD-10-CM | POA: Diagnosis not present

## 2013-10-11 DIAGNOSIS — T8189XA Other complications of procedures, not elsewhere classified, initial encounter: Secondary | ICD-10-CM | POA: Diagnosis not present

## 2013-10-11 DIAGNOSIS — Z96649 Presence of unspecified artificial hip joint: Secondary | ICD-10-CM | POA: Diagnosis not present

## 2013-10-17 DIAGNOSIS — T8189XA Other complications of procedures, not elsewhere classified, initial encounter: Secondary | ICD-10-CM | POA: Diagnosis not present

## 2013-10-17 DIAGNOSIS — L03319 Cellulitis of trunk, unspecified: Secondary | ICD-10-CM | POA: Diagnosis not present

## 2013-10-17 DIAGNOSIS — I4891 Unspecified atrial fibrillation: Secondary | ICD-10-CM | POA: Diagnosis not present

## 2013-10-17 DIAGNOSIS — I714 Abdominal aortic aneurysm, without rupture, unspecified: Secondary | ICD-10-CM | POA: Diagnosis not present

## 2013-10-17 DIAGNOSIS — E1149 Type 2 diabetes mellitus with other diabetic neurological complication: Secondary | ICD-10-CM | POA: Diagnosis not present

## 2013-10-17 DIAGNOSIS — L02219 Cutaneous abscess of trunk, unspecified: Secondary | ICD-10-CM | POA: Diagnosis not present

## 2013-10-17 DIAGNOSIS — Z96649 Presence of unspecified artificial hip joint: Secondary | ICD-10-CM | POA: Diagnosis not present

## 2013-10-23 DIAGNOSIS — H02839 Dermatochalasis of unspecified eye, unspecified eyelid: Secondary | ICD-10-CM | POA: Diagnosis not present

## 2013-10-23 DIAGNOSIS — H18419 Arcus senilis, unspecified eye: Secondary | ICD-10-CM | POA: Diagnosis not present

## 2013-10-23 DIAGNOSIS — Z961 Presence of intraocular lens: Secondary | ICD-10-CM | POA: Diagnosis not present

## 2013-10-23 DIAGNOSIS — H3530 Unspecified macular degeneration: Secondary | ICD-10-CM | POA: Diagnosis not present

## 2013-10-24 DIAGNOSIS — T8189XA Other complications of procedures, not elsewhere classified, initial encounter: Secondary | ICD-10-CM | POA: Diagnosis not present

## 2013-10-24 DIAGNOSIS — Z96649 Presence of unspecified artificial hip joint: Secondary | ICD-10-CM | POA: Diagnosis not present

## 2013-10-30 DIAGNOSIS — L97409 Non-pressure chronic ulcer of unspecified heel and midfoot with unspecified severity: Secondary | ICD-10-CM | POA: Diagnosis not present

## 2013-10-31 DIAGNOSIS — T8450XA Infection and inflammatory reaction due to unspecified internal joint prosthesis, initial encounter: Secondary | ICD-10-CM | POA: Diagnosis not present

## 2013-10-31 DIAGNOSIS — S71109A Unspecified open wound, unspecified thigh, initial encounter: Secondary | ICD-10-CM | POA: Diagnosis not present

## 2013-10-31 DIAGNOSIS — Z96649 Presence of unspecified artificial hip joint: Secondary | ICD-10-CM | POA: Diagnosis not present

## 2013-10-31 DIAGNOSIS — T889XXS Complication of surgical and medical care, unspecified, sequela: Secondary | ICD-10-CM | POA: Diagnosis not present

## 2013-10-31 DIAGNOSIS — S71009A Unspecified open wound, unspecified hip, initial encounter: Secondary | ICD-10-CM | POA: Diagnosis not present

## 2013-11-01 DIAGNOSIS — T8189XA Other complications of procedures, not elsewhere classified, initial encounter: Secondary | ICD-10-CM | POA: Diagnosis not present

## 2013-11-01 DIAGNOSIS — Z96649 Presence of unspecified artificial hip joint: Secondary | ICD-10-CM | POA: Diagnosis not present

## 2013-11-07 DIAGNOSIS — T8450XA Infection and inflammatory reaction due to unspecified internal joint prosthesis, initial encounter: Secondary | ICD-10-CM | POA: Diagnosis not present

## 2013-11-07 DIAGNOSIS — M2580 Other specified joint disorders, unspecified joint: Secondary | ICD-10-CM | POA: Diagnosis not present

## 2013-11-07 DIAGNOSIS — K632 Fistula of intestine: Secondary | ICD-10-CM | POA: Diagnosis not present

## 2013-11-07 DIAGNOSIS — T889XXS Complication of surgical and medical care, unspecified, sequela: Secondary | ICD-10-CM | POA: Diagnosis not present

## 2013-11-08 DIAGNOSIS — I658 Occlusion and stenosis of other precerebral arteries: Secondary | ICD-10-CM | POA: Diagnosis not present

## 2013-11-08 DIAGNOSIS — I4891 Unspecified atrial fibrillation: Secondary | ICD-10-CM | POA: Diagnosis not present

## 2013-11-09 DIAGNOSIS — T8450XA Infection and inflammatory reaction due to unspecified internal joint prosthesis, initial encounter: Secondary | ICD-10-CM | POA: Diagnosis not present

## 2013-11-09 DIAGNOSIS — Z96649 Presence of unspecified artificial hip joint: Secondary | ICD-10-CM | POA: Diagnosis not present

## 2013-11-09 DIAGNOSIS — T8189XA Other complications of procedures, not elsewhere classified, initial encounter: Secondary | ICD-10-CM | POA: Diagnosis not present

## 2013-11-14 DIAGNOSIS — Z96649 Presence of unspecified artificial hip joint: Secondary | ICD-10-CM | POA: Diagnosis not present

## 2013-11-14 DIAGNOSIS — T8189XA Other complications of procedures, not elsewhere classified, initial encounter: Secondary | ICD-10-CM | POA: Diagnosis not present

## 2013-11-21 DIAGNOSIS — Z96649 Presence of unspecified artificial hip joint: Secondary | ICD-10-CM | POA: Diagnosis not present

## 2013-11-21 DIAGNOSIS — I719 Aortic aneurysm of unspecified site, without rupture: Secondary | ICD-10-CM | POA: Diagnosis not present

## 2013-11-21 DIAGNOSIS — I4891 Unspecified atrial fibrillation: Secondary | ICD-10-CM | POA: Diagnosis not present

## 2013-11-21 DIAGNOSIS — T8450XA Infection and inflammatory reaction due to unspecified internal joint prosthesis, initial encounter: Secondary | ICD-10-CM | POA: Diagnosis not present

## 2013-11-21 DIAGNOSIS — Z5189 Encounter for other specified aftercare: Secondary | ICD-10-CM | POA: Diagnosis not present

## 2013-11-22 DIAGNOSIS — T8189XA Other complications of procedures, not elsewhere classified, initial encounter: Secondary | ICD-10-CM | POA: Diagnosis not present

## 2013-11-22 DIAGNOSIS — Z96649 Presence of unspecified artificial hip joint: Secondary | ICD-10-CM | POA: Diagnosis not present

## 2013-11-29 DIAGNOSIS — L570 Actinic keratosis: Secondary | ICD-10-CM | POA: Diagnosis not present

## 2013-11-29 DIAGNOSIS — Z96649 Presence of unspecified artificial hip joint: Secondary | ICD-10-CM | POA: Diagnosis not present

## 2013-11-29 DIAGNOSIS — T8189XA Other complications of procedures, not elsewhere classified, initial encounter: Secondary | ICD-10-CM | POA: Diagnosis not present

## 2013-12-03 DIAGNOSIS — T8450XA Infection and inflammatory reaction due to unspecified internal joint prosthesis, initial encounter: Secondary | ICD-10-CM | POA: Diagnosis not present

## 2013-12-03 DIAGNOSIS — T888XXD Other specified complications of surgical and medical care, not elsewhere classified, subsequent encounter: Secondary | ICD-10-CM | POA: Diagnosis not present

## 2013-12-03 DIAGNOSIS — R944 Abnormal results of kidney function studies: Secondary | ICD-10-CM | POA: Diagnosis not present

## 2013-12-03 DIAGNOSIS — M8610 Other acute osteomyelitis, unspecified site: Secondary | ICD-10-CM | POA: Diagnosis not present

## 2013-12-03 DIAGNOSIS — Z96649 Presence of unspecified artificial hip joint: Secondary | ICD-10-CM | POA: Diagnosis not present

## 2013-12-03 DIAGNOSIS — Z5189 Encounter for other specified aftercare: Secondary | ICD-10-CM | POA: Diagnosis not present

## 2013-12-03 DIAGNOSIS — Z96641 Presence of right artificial hip joint: Secondary | ICD-10-CM | POA: Diagnosis not present

## 2013-12-03 DIAGNOSIS — B3789 Other sites of candidiasis: Secondary | ICD-10-CM | POA: Diagnosis not present

## 2013-12-03 DIAGNOSIS — R799 Abnormal finding of blood chemistry, unspecified: Secondary | ICD-10-CM | POA: Diagnosis not present

## 2013-12-06 DIAGNOSIS — T888XXD Other specified complications of surgical and medical care, not elsewhere classified, subsequent encounter: Secondary | ICD-10-CM | POA: Diagnosis not present

## 2013-12-06 DIAGNOSIS — L97409 Non-pressure chronic ulcer of unspecified heel and midfoot with unspecified severity: Secondary | ICD-10-CM | POA: Diagnosis not present

## 2013-12-06 DIAGNOSIS — Z96641 Presence of right artificial hip joint: Secondary | ICD-10-CM | POA: Diagnosis not present

## 2013-12-07 DIAGNOSIS — T888XXD Other specified complications of surgical and medical care, not elsewhere classified, subsequent encounter: Secondary | ICD-10-CM | POA: Diagnosis not present

## 2013-12-07 DIAGNOSIS — Z96641 Presence of right artificial hip joint: Secondary | ICD-10-CM | POA: Diagnosis not present

## 2013-12-12 DIAGNOSIS — T8189XA Other complications of procedures, not elsewhere classified, initial encounter: Secondary | ICD-10-CM | POA: Diagnosis not present

## 2013-12-12 DIAGNOSIS — I714 Abdominal aortic aneurysm, without rupture, unspecified: Secondary | ICD-10-CM | POA: Diagnosis not present

## 2013-12-12 DIAGNOSIS — L03319 Cellulitis of trunk, unspecified: Secondary | ICD-10-CM | POA: Diagnosis not present

## 2013-12-12 DIAGNOSIS — E7800 Pure hypercholesterolemia, unspecified: Secondary | ICD-10-CM | POA: Diagnosis not present

## 2013-12-12 DIAGNOSIS — L02219 Cutaneous abscess of trunk, unspecified: Secondary | ICD-10-CM | POA: Diagnosis not present

## 2013-12-12 DIAGNOSIS — G609 Hereditary and idiopathic neuropathy, unspecified: Secondary | ICD-10-CM | POA: Diagnosis not present

## 2013-12-13 DIAGNOSIS — T888XXD Other specified complications of surgical and medical care, not elsewhere classified, subsequent encounter: Secondary | ICD-10-CM | POA: Diagnosis not present

## 2013-12-13 DIAGNOSIS — M6281 Muscle weakness (generalized): Secondary | ICD-10-CM | POA: Diagnosis not present

## 2013-12-13 DIAGNOSIS — R269 Unspecified abnormalities of gait and mobility: Secondary | ICD-10-CM | POA: Diagnosis not present

## 2013-12-13 DIAGNOSIS — Z96641 Presence of right artificial hip joint: Secondary | ICD-10-CM | POA: Diagnosis not present

## 2013-12-14 DIAGNOSIS — N139 Obstructive and reflux uropathy, unspecified: Secondary | ICD-10-CM | POA: Diagnosis not present

## 2013-12-14 DIAGNOSIS — N401 Enlarged prostate with lower urinary tract symptoms: Secondary | ICD-10-CM | POA: Diagnosis not present

## 2013-12-14 DIAGNOSIS — N138 Other obstructive and reflux uropathy: Secondary | ICD-10-CM | POA: Diagnosis not present

## 2013-12-19 DIAGNOSIS — Z954 Presence of other heart-valve replacement: Secondary | ICD-10-CM | POA: Diagnosis not present

## 2013-12-19 DIAGNOSIS — I714 Abdominal aortic aneurysm, without rupture, unspecified: Secondary | ICD-10-CM | POA: Diagnosis not present

## 2013-12-19 DIAGNOSIS — S31109A Unspecified open wound of abdominal wall, unspecified quadrant without penetration into peritoneal cavity, initial encounter: Secondary | ICD-10-CM | POA: Diagnosis not present

## 2013-12-19 DIAGNOSIS — Z09 Encounter for follow-up examination after completed treatment for conditions other than malignant neoplasm: Secondary | ICD-10-CM | POA: Diagnosis not present

## 2013-12-20 ENCOUNTER — Encounter: Payer: Self-pay | Admitting: Neurology

## 2013-12-20 ENCOUNTER — Ambulatory Visit (INDEPENDENT_AMBULATORY_CARE_PROVIDER_SITE_OTHER): Payer: Medicare Other | Admitting: Neurology

## 2013-12-20 VITALS — BP 121/79 | HR 61

## 2013-12-20 DIAGNOSIS — R269 Unspecified abnormalities of gait and mobility: Secondary | ICD-10-CM | POA: Diagnosis not present

## 2013-12-20 DIAGNOSIS — D518 Other vitamin B12 deficiency anemias: Secondary | ICD-10-CM

## 2013-12-20 DIAGNOSIS — R6889 Other general symptoms and signs: Secondary | ICD-10-CM

## 2013-12-20 DIAGNOSIS — G63 Polyneuropathy in diseases classified elsewhere: Secondary | ICD-10-CM

## 2013-12-20 DIAGNOSIS — Z96641 Presence of right artificial hip joint: Secondary | ICD-10-CM | POA: Diagnosis not present

## 2013-12-20 DIAGNOSIS — D513 Other dietary vitamin B12 deficiency anemia: Secondary | ICD-10-CM

## 2013-12-20 DIAGNOSIS — T888XXD Other specified complications of surgical and medical care, not elsewhere classified, subsequent encounter: Secondary | ICD-10-CM | POA: Diagnosis not present

## 2013-12-20 HISTORY — DX: Polyneuropathy in diseases classified elsewhere: G63

## 2013-12-20 HISTORY — DX: Unspecified abnormalities of gait and mobility: R26.9

## 2013-12-20 NOTE — Progress Notes (Signed)
Reason for visit: Gait disorder  Walter Bryant is a 80 y.o. 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 The patient has a progressive peripheral neuropathy that he indicates has been present for least 10-15 years, diagnosed in 2006. The patient has numbness up to the knees, bilateral foot drops, and a significant gait disorder associated with this. The patient has been using a rollator for ambulation since December 2014. The patient denies any problems controlling the bowels or the bladder. He has had some history of lumbosacral spinal stenosis with surgery at the L3-4 level that was done within the last year or 2. The patient indicates that he is not having significant pain in the neck or low back or pain radiating down the arms or legs at this time. He does have some weakness in the hands, atrophy of the first dorsal interosseous muscle on the left hand. The patient has had issues with sterile abscesses following a right total hip replacement in 2011. The patient was found to have an allergy to the suture material. The patient also indicates a history of an abdominal aortic aneurysm that may require surgery in the near future. Within the last 2 or 3 weeks, he has noted a significant change in the strength of the legs, and inability to ambulate effectively. The patient has had some difficulty with flexion of the right hip since surgery, but now the left leg is weak, and he has difficulty lifting the leg. The patient is on a statin drug, but a recent liver profile showed a normal SGOT level. The patient has fallen recently, within the last week. The patient wears an AFO brace on the left. He comes to this office for further evaluation of the significant change in his walking. The etiology of the peripheral neuropathy is unknown. The patient had some elevation in fasting blood sugars, but his  hemoglobin A1c has always been normal. There is no family history of diabetes.  Past Medical History  Diagnosis Date  . Arthritis   . GERD (gastroesophageal reflux disease)   . Hyperlipidemia   . Hypertension   . Polyneuropathy in other diseases classified elsewhere 12/20/2013  . Abnormality of gait 12/20/2013  . AAA (abdominal aortic aneurysm)   . A-fib   . BPH (benign prostatic hyperplasia)   . Vertigo   . History of concussion   . Right clavicle fracture   . Left tibial fracture   . Macular degeneration Left  . Barrett's esophagus   . Degenerative arthritis   . Transient global amnesia   . HOH (hard of hearing)   . Anxiety and depression     Past Surgical History  Procedure Laterality Date  . Tonsillectomy and adenoidectomy    . Knee surgery      both  knees replaced  . Wrist fracture surgery    . Total hip arthroplasty      right  . Appendectomy    . Cataract extraction      Bilateral  . Laparoscopic right hemi colectomy      Family History  Problem Relation Age of Onset  . Heart disease Sister     AFIB  . Neuropathy Neg Hx     Social history:  reports that he quit smoking about 47 years ago. He has never used smokeless tobacco. He reports that he does not drink alcohol or use illicit drugs.  Medications:  Current Outpatient Prescriptions on File Prior to Visit  Medication Sig Dispense Refill  . dabigatran (PRADAXA) 150 MG CAPS capsule Take 150 mg by mouth 2 (two) times daily.      Marland Kitchen doxycycline (VIBRA-TABS) 100 MG tablet Take 1 tablet (100 mg total) by mouth 2 (two) times daily.  42 tablet  1  . metoprolol succinate (TOPROL-XL) 50 MG 24 hr tablet Take 50 mg by mouth daily. Take with or immediately following a meal.      . omeprazole (PRILOSEC) 20 MG capsule Take 20 mg by mouth daily.      . pregabalin (LYRICA) 75 MG capsule Take 75 mg by mouth 4 (four) times daily as needed.      . ranitidine (ZANTAC) 150 MG tablet Take 150 mg by mouth 2 (two) times daily.       . tamsulosin (FLOMAX) 0.4 MG CAPS capsule Take 0.4 mg by mouth.       No current facility-administered medications on file prior to visit.      Allergies  Allergen Reactions  . Morphine   . Other     vicroyl sutures  . Oxycodone     ROS:  Out of a complete 14 system review of symptoms, the patient complains only of the following symptoms, and all other reviewed systems are negative.  Fatigue Joint pain, achy muscles Numbness in the legs  Blood pressure 121/79, pulse 61, height 0' (0 m), weight 0 lb (0 kg).  Physical Exam  General: The patient is alert and cooperative at the time of the examination. The patient is moderately obese.  Eyes: Pupils are equal, round, and reactive to light. Discs are flat bilaterally.  Neck: The neck is supple, no carotid bruits are noted.  Respiratory: The respiratory examination is clear.  Cardiovascular: The cardiovascular examination reveals an occasional irregular rate and rhythm, no obvious murmurs or rubs are noted.  Skin: Extremities are without significant edema.  Neurologic Exam  Mental status: The patient is alert and oriented x 3 at the time of the examination. The patient has apparent normal recent and remote memory, with an apparently normal attention span and concentration ability.  Cranial nerves: Facial symmetry is present. There is good sensation of the face to pinprick and soft touch bilaterally. The strength of the facial muscles and the muscles to head turning and shoulder shrug are normal bilaterally. Speech is well enunciated, no aphasia or dysarthria is noted. Extraocular movements are full. Visual fields are full. The tongue is midline, and the patient has symmetric elevation of the soft palate. No obvious hearing deficits are noted.  Motor: The motor testing reveals 5 over 5 strength of the upper extremities with exception of some weakness of the intrinsic muscles of the hands bilaterally. With the lower extremities,  the patient has prominent bilateral foot drops, with inversion and eversion weakness of both feet. The patient has bilateral proximal weakness of the legs, 4 minus/5 bilaterally. The patient has good extension strength and flexion strength at the knees bilaterally. Good symmetric motor tone is noted throughout.  Sensory: Sensory testing is intact to pinprick, soft touch, vibration sensation, and position sense on the upper extremities. With the lower extremities, there is a stocking pattern pinprick sensory deficit to the knees bilaterally. Severe impairment in vibration sensation and position sensation is noted in the feet. The patient does have vibration sensation at the knees. No evidence of extinction is noted.  Coordination: Cerebellar testing reveals good finger-nose-finger and heel-to-shin  bilaterally. The patient does have some difficulty performing heel-to-shin bilaterally secondary to proximal leg weakness.  Gait and station: The patient is unable to arise from a seated position. The patient could not be ambulated.  Reflexes: Deep tendon reflexes are symmetric, but are absent bilaterally. Toes are downgoing bilaterally.   Assessment/Plan:  1. Severe peripheral neuropathy  2. Proximal muscle weakness bilateral lower extremities  3. Gait disorder  The patient has developed proximal muscle weakness recently within the last several weeks, and he has lost the ability to ambulate effectively. The patient has significant right hip disease, but he clearly has bilateral hip flexion weakness at this time. Onset has been painless. The patient does have a history of lumbosacral spine disease with prior surgery done. The patient will be set up for blood work today, and he will return for nerve conduction studies of both legs and left arm. EMG will be done on both legs. Depending upon the results of the above study, the patient may be sent for MRI evaluation of the lumbosacral spine. The patient will  be undergoing some physical therapy.  Jill Alexanders MD 12/20/2013 8:03 PM  [facility] [address] [place], [place] [address]  Phone [phone] Fax [phone]

## 2013-12-20 NOTE — Patient Instructions (Signed)

## 2013-12-21 ENCOUNTER — Telehealth: Payer: Self-pay | Admitting: Neurology

## 2013-12-21 DIAGNOSIS — T888XXD Other specified complications of surgical and medical care, not elsewhere classified, subsequent encounter: Secondary | ICD-10-CM | POA: Diagnosis not present

## 2013-12-21 DIAGNOSIS — G63 Polyneuropathy in diseases classified elsewhere: Secondary | ICD-10-CM

## 2013-12-21 DIAGNOSIS — Z96641 Presence of right artificial hip joint: Secondary | ICD-10-CM | POA: Diagnosis not present

## 2013-12-21 DIAGNOSIS — R269 Unspecified abnormalities of gait and mobility: Secondary | ICD-10-CM

## 2013-12-21 NOTE — Telephone Encounter (Signed)
I will generate a prescription, fax it over.

## 2013-12-21 NOTE — Telephone Encounter (Signed)
Patient's wife calling to state they are at [facility] supply trying to buy a lifted chair for patient but they need a script signed by Dr. Jannifer [place] faxed over to them, the fax is: [phone].

## 2013-12-24 LAB — IFE AND PE, SERUM
ALBUMIN SERPL ELPH-MCNC: 3.2 g/dL (ref 3.2–5.6)
ALBUMIN/GLOB SERPL: 1.2 (ref 0.7–2.0)
Alpha 1: 0.3 g/dL (ref 0.1–0.4)
Alpha2 Glob SerPl Elph-Mcnc: 0.8 g/dL (ref 0.4–1.2)
B-Globulin SerPl Elph-Mcnc: 0.8 g/dL (ref 0.6–1.3)
GAMMA GLOB SERPL ELPH-MCNC: 0.9 g/dL (ref 0.5–1.6)
Globulin, Total: 2.8 g/dL (ref 2.0–4.5)
IGG (IMMUNOGLOBIN G), SERUM: 983 mg/dL (ref 700–1600)
IgA/Immunoglobulin A, Serum: 169 mg/dL (ref 91–414)
Total Protein: 6 g/dL (ref 6.0–8.5)

## 2013-12-24 LAB — VITAMIN B12: Vitamin B-12: 884 pg/mL (ref 211–946)

## 2013-12-24 LAB — CK: Total CK: 75 U/L (ref 24–204)

## 2013-12-24 LAB — TSH: TSH: 1.21 u[IU]/mL (ref 0.450–4.500)

## 2013-12-24 LAB — ANA W/REFLEX: Anti Nuclear Antibody(ANA): NEGATIVE

## 2013-12-24 LAB — COPPER, SERUM: Copper: 137 ug/dL (ref 72–166)

## 2013-12-24 LAB — LYME, TOTAL AB TEST/REFLEX

## 2013-12-24 LAB — ANGIOTENSIN CONVERTING ENZYME: Angio Convert Enzyme: 69 U/L (ref 14–82)

## 2013-12-25 ENCOUNTER — Telehealth: Payer: Self-pay | Admitting: *Deleted

## 2013-12-25 DIAGNOSIS — T888XXD Other specified complications of surgical and medical care, not elsewhere classified, subsequent encounter: Secondary | ICD-10-CM | POA: Diagnosis not present

## 2013-12-25 DIAGNOSIS — Z96641 Presence of right artificial hip joint: Secondary | ICD-10-CM | POA: Diagnosis not present

## 2013-12-25 NOTE — Telephone Encounter (Signed)
Please call the patient. The blood work results are unremarkable. Thank you.  

## 2013-12-26 DIAGNOSIS — T888XXD Other specified complications of surgical and medical care, not elsewhere classified, subsequent encounter: Secondary | ICD-10-CM | POA: Diagnosis not present

## 2013-12-26 DIAGNOSIS — Z96641 Presence of right artificial hip joint: Secondary | ICD-10-CM | POA: Diagnosis not present

## 2013-12-27 ENCOUNTER — Ambulatory Visit (INDEPENDENT_AMBULATORY_CARE_PROVIDER_SITE_OTHER): Payer: Medicare Other | Admitting: Neurology

## 2013-12-27 ENCOUNTER — Encounter (INDEPENDENT_AMBULATORY_CARE_PROVIDER_SITE_OTHER): Payer: Self-pay

## 2013-12-27 DIAGNOSIS — R269 Unspecified abnormalities of gait and mobility: Secondary | ICD-10-CM

## 2013-12-27 DIAGNOSIS — G63 Polyneuropathy in diseases classified elsewhere: Secondary | ICD-10-CM | POA: Diagnosis not present

## 2013-12-27 DIAGNOSIS — T888XXD Other specified complications of surgical and medical care, not elsewhere classified, subsequent encounter: Secondary | ICD-10-CM | POA: Diagnosis not present

## 2013-12-27 DIAGNOSIS — Z96641 Presence of right artificial hip joint: Secondary | ICD-10-CM | POA: Diagnosis not present

## 2013-12-27 DIAGNOSIS — Z0289 Encounter for other administrative examinations: Secondary | ICD-10-CM

## 2013-12-27 NOTE — Progress Notes (Signed)
Walter Bryant is an 80 year old gentleman with a long-standing history of a peripheral neuropathy associated with a gait disorder. His ability to ambulate suddenly worsened within the last several weeks. The patient is being evaluated for this rapid change in his ambulatory ability.  EMG nerve conduction study was done today, results are as follows:  IMPRESSION:  Nerve conduction studies done on the left upper extremity and both lower extremities shows evidence of a severe, end-stage peripheral neuropathy. There is evidence of left carpal tunnel syndrome as well of moderate severity. EMG evaluation of both lower extremities shows distal acute and chronic denervation consistent with the diagnosis of a severe peripheral neuropathy. There is no evidence of an overlying lumbosacral radiculopathy on either side, and no evidence of a myopathic process is seen.   The patient will be set up for MRI evaluation of the lumbosacral spine and thoracic spine to exclude spinal stenosis or cord pathology as an etiology of the sudden change in walking. The patient is getting physical therapy at this point, he will followup in 2-3 months.

## 2013-12-27 NOTE — Procedures (Signed)
     HISTORY:  Walter Bryant is an 80 year old gentleman with a history of prior lumbosacral spine surgery, and a long-standing history of a peripheral neuropathy. Within the last several weeks, he has lost the ability to ambulate effectively, with increased weakness of both legs. The patient is being evaluated for this issue.  NERVE CONDUCTION STUDIES:  Nerve conduction studies were performed on the left upper extremity. The distal motor latency for the left median nerve was prolonged, with a low motor amplitude. The distal motor latency and motor amplitude for the left ulnar nerve was normal. The F wave latencies for the left median and ulnar nerves were prolonged, with normal nerve conduction velocities seen for these nerves. The left median sensory latency was prolonged, normal for the left ulnar nerve.  Nerve conduction studies were performed on both lower extremities. No response was seen for the posterior tibial or peroneal nerves bilaterally. The H reflex latencies were unobtainable bilaterally. The peroneal sensory latencies were unobtainable bilaterally.  EMG STUDIES:  EMG study was performed on the right lower extremity:  The tibialis anterior muscle reveals 1 to 4K motor units with moderately reduced recruitment. 2+ fibrillations and positive waves were seen. The peroneus tertius muscle reveals no voluntary motor units with no recruitment. No fibrillations or positive waves were seen. The medial gastrocnemius muscle reveals 1 to 3K motor units with moderately decreased recruitment. 2+ fibrillations and positive waves were seen. The vastus lateralis muscle reveals 2 to 4K motor units with decreased recruitment. No fibrillations or positive waves were seen. The iliopsoas muscle reveals 2 to 4K motor units with full recruitment. No fibrillations or positive waves were seen. The biceps femoris muscle (long head) reveals 2 to 4K motor units with full recruitment. No fibrillations or  positive waves were seen. The lumbosacral paraspinal muscles were tested at 3 levels, and revealed no abnormalities of insertional activity at all 3 levels tested. There was good relaxation.  EMG study was performed on the left lower extremity:  The tibialis anterior muscle reveals 2 to 5K motor units with decreased recruitment. 2+ fibrillations and positive waves were seen. The peroneus tertius muscle reveals 2 to 3K motor units with markedly decrease recruitment. 2+ fibrillations and positive waves were seen. The medial gastrocnemius muscle reveals 1 to 3K motor units with decreased recruitment. One plus fibrillations and positive waves were seen. The vastus lateralis muscle reveals 2 to 4K motor units with full recruitment. No fibrillations or positive waves were seen. The iliopsoas muscle reveals 2 to 4K motor units with full recruitment. No fibrillations or positive waves were seen. The biceps femoris muscle (long head) reveals 2 to 4K motor units with full recruitment. No fibrillations or positive waves were seen. The lumbosacral paraspinal muscles were tested at 3 levels, and revealed no abnormalities of insertional activity at all 3 levels tested. There was good relaxation.   IMPRESSION:  Nerve conduction studies done on the left upper extremity and both lower extremities shows evidence of a severe, end-stage peripheral neuropathy. There is evidence of left carpal tunnel syndrome as well of moderate severity. EMG evaluation of both lower extremities shows distal acute and chronic denervation consistent with the diagnosis of a severe peripheral neuropathy. There is no evidence of an overlying lumbosacral radiculopathy on either side, and no evidence of a myopathic process is seen.  Jill Alexanders MD 12/27/2013 2:00 PM  [facility] [address] [place], [place] [address]  Phone [phone] Fax [phone]

## 2013-12-28 DIAGNOSIS — T888XXD Other specified complications of surgical and medical care, not elsewhere classified, subsequent encounter: Secondary | ICD-10-CM | POA: Diagnosis not present

## 2013-12-28 DIAGNOSIS — Z96641 Presence of right artificial hip joint: Secondary | ICD-10-CM | POA: Diagnosis not present

## 2013-12-31 DIAGNOSIS — Z96641 Presence of right artificial hip joint: Secondary | ICD-10-CM | POA: Diagnosis not present

## 2013-12-31 DIAGNOSIS — T888XXD Other specified complications of surgical and medical care, not elsewhere classified, subsequent encounter: Secondary | ICD-10-CM | POA: Diagnosis not present

## 2013-12-31 DIAGNOSIS — R799 Abnormal finding of blood chemistry, unspecified: Secondary | ICD-10-CM | POA: Diagnosis not present

## 2014-01-02 DIAGNOSIS — T888XXD Other specified complications of surgical and medical care, not elsewhere classified, subsequent encounter: Secondary | ICD-10-CM | POA: Diagnosis not present

## 2014-01-02 DIAGNOSIS — G609 Hereditary and idiopathic neuropathy, unspecified: Secondary | ICD-10-CM | POA: Diagnosis not present

## 2014-01-02 DIAGNOSIS — Z96641 Presence of right artificial hip joint: Secondary | ICD-10-CM | POA: Diagnosis not present

## 2014-01-03 DIAGNOSIS — Z96641 Presence of right artificial hip joint: Secondary | ICD-10-CM | POA: Diagnosis not present

## 2014-01-03 DIAGNOSIS — T888XXD Other specified complications of surgical and medical care, not elsewhere classified, subsequent encounter: Secondary | ICD-10-CM | POA: Diagnosis not present

## 2014-01-04 DIAGNOSIS — Z96641 Presence of right artificial hip joint: Secondary | ICD-10-CM | POA: Diagnosis not present

## 2014-01-04 DIAGNOSIS — T888XXD Other specified complications of surgical and medical care, not elsewhere classified, subsequent encounter: Secondary | ICD-10-CM | POA: Diagnosis not present

## 2014-01-07 DIAGNOSIS — T888XXD Other specified complications of surgical and medical care, not elsewhere classified, subsequent encounter: Secondary | ICD-10-CM | POA: Diagnosis not present

## 2014-01-07 DIAGNOSIS — Z96641 Presence of right artificial hip joint: Secondary | ICD-10-CM | POA: Diagnosis not present

## 2014-01-09 ENCOUNTER — Telehealth: Payer: Self-pay | Admitting: Neurology

## 2014-01-09 ENCOUNTER — Ambulatory Visit
Admission: RE | Admit: 2014-01-09 | Discharge: 2014-01-09 | Disposition: A | Discharge status: Home / Self Care | Payer: Medicare Other | Source: Ambulatory Visit | Attending: Neurology | Admitting: Neurology

## 2014-01-09 DIAGNOSIS — G63 Polyneuropathy in diseases classified elsewhere: Secondary | ICD-10-CM

## 2014-01-09 DIAGNOSIS — R269 Unspecified abnormalities of gait and mobility: Secondary | ICD-10-CM

## 2014-01-09 NOTE — Telephone Encounter (Signed)
I called patient. I discussed the MRI results, nothing there that would prevent him from walking. I think his gait issues are a result of the severe peripheral neuropathy, as well as the intrinsic right hip disease. The patient has a significant leg length discrepancy with the right leg being shorter. The patient needs to focus on gaining strength and stamina in the proximal muscles of the legs.   MRI thoracic spine January 09, 2014:  Impression   Equivocal MRI thoracic spine (without) demonstrating: 1. No intrinsic or compressive spinal cord lesions. No spinal stenosis or  foraminal narrowing. 2. Incidentally noted are left renal cyst (7.4cm) and abdominal aortic  aneurysm (3.5cm).    MRI lumbar spine 01/09/14:  IMPRESSION:  Abnormal MRI lumbar spine (without) demonstrating:  1. At L3-4: disc bulging, facet hypertrophy, right laminectomy, with moderate right and mild-moderate biforaminal stenosis  2. At L4-5, L5-S1: disc bulging and facet hypertrophy with mild biforaminal stenosis; anterior spondylolisthesis of L4 on L5 (11mm)  3. At L2-3: disc bulging and facet hypertrophy with mild left foraminal stenosis  4. Abdominal aortic aneurysm (5.4cm)

## 2014-01-10 ENCOUNTER — Telehealth: Payer: Self-pay | Admitting: Neurology

## 2014-01-10 DIAGNOSIS — Z96641 Presence of right artificial hip joint: Secondary | ICD-10-CM | POA: Diagnosis not present

## 2014-01-10 DIAGNOSIS — T888XXD Other specified complications of surgical and medical care, not elsewhere classified, subsequent encounter: Secondary | ICD-10-CM | POA: Diagnosis not present

## 2014-01-10 NOTE — Telephone Encounter (Signed)
See phone note. I did not read where 10/16 was not soon enough.

## 2014-01-10 NOTE — Telephone Encounter (Signed)
Patient stated he spoke with Dr. Jannifer [place] 9/23 regarding MRI results.  Dr. Jannifer [place] wanted to see him back in office, first available appointment was 10/16.  Patient stated appointment needed to be sooner.  Please call and advise.

## 2014-01-10 NOTE — Telephone Encounter (Signed)
I called patient. The patient is in physical therapy, I would want to wait several weeks to see if he makes progress with the proximal leg weakness. If the patient believes that there is some urgent issue, I can always work him in sooner. He will call us back if this is the case.

## 2014-01-15 DIAGNOSIS — Z96641 Presence of right artificial hip joint: Secondary | ICD-10-CM | POA: Diagnosis not present

## 2014-01-15 DIAGNOSIS — T888XXD Other specified complications of surgical and medical care, not elsewhere classified, subsequent encounter: Secondary | ICD-10-CM | POA: Diagnosis not present

## 2014-01-17 DIAGNOSIS — H43813 Vitreous degeneration, bilateral: Secondary | ICD-10-CM | POA: Diagnosis not present

## 2014-01-17 DIAGNOSIS — H3531 Nonexudative age-related macular degeneration: Secondary | ICD-10-CM | POA: Diagnosis not present

## 2014-01-17 DIAGNOSIS — T888XXD Other specified complications of surgical and medical care, not elsewhere classified, subsequent encounter: Secondary | ICD-10-CM | POA: Diagnosis not present

## 2014-01-17 DIAGNOSIS — H3532 Exudative age-related macular degeneration: Secondary | ICD-10-CM | POA: Diagnosis not present

## 2014-01-17 DIAGNOSIS — Z96641 Presence of right artificial hip joint: Secondary | ICD-10-CM | POA: Diagnosis not present

## 2014-01-22 DIAGNOSIS — Z96641 Presence of right artificial hip joint: Secondary | ICD-10-CM | POA: Diagnosis not present

## 2014-01-22 DIAGNOSIS — T888XXD Other specified complications of surgical and medical care, not elsewhere classified, subsequent encounter: Secondary | ICD-10-CM | POA: Diagnosis not present

## 2014-01-23 DIAGNOSIS — B3789 Other sites of candidiasis: Secondary | ICD-10-CM | POA: Diagnosis not present

## 2014-01-23 DIAGNOSIS — T847XXD Infection and inflammatory reaction due to other internal orthopedic prosthetic devices, implants and grafts, subsequent encounter: Secondary | ICD-10-CM | POA: Diagnosis not present

## 2014-01-23 DIAGNOSIS — M2510 Fistula, unspecified joint: Secondary | ICD-10-CM | POA: Diagnosis not present

## 2014-01-23 DIAGNOSIS — M8610 Other acute osteomyelitis, unspecified site: Secondary | ICD-10-CM | POA: Diagnosis not present

## 2014-01-23 DIAGNOSIS — Z5181 Encounter for therapeutic drug level monitoring: Secondary | ICD-10-CM | POA: Diagnosis not present

## 2014-01-24 DIAGNOSIS — Z96641 Presence of right artificial hip joint: Secondary | ICD-10-CM | POA: Diagnosis not present

## 2014-01-24 DIAGNOSIS — T888XXD Other specified complications of surgical and medical care, not elsewhere classified, subsequent encounter: Secondary | ICD-10-CM | POA: Diagnosis not present

## 2014-01-25 DIAGNOSIS — T888XXD Other specified complications of surgical and medical care, not elsewhere classified, subsequent encounter: Secondary | ICD-10-CM | POA: Diagnosis not present

## 2014-01-25 DIAGNOSIS — M4800 Spinal stenosis, site unspecified: Secondary | ICD-10-CM | POA: Diagnosis not present

## 2014-01-25 DIAGNOSIS — I10 Essential (primary) hypertension: Secondary | ICD-10-CM | POA: Diagnosis not present

## 2014-01-25 DIAGNOSIS — E785 Hyperlipidemia, unspecified: Secondary | ICD-10-CM | POA: Diagnosis not present

## 2014-01-25 DIAGNOSIS — I714 Abdominal aortic aneurysm, without rupture: Secondary | ICD-10-CM | POA: Diagnosis not present

## 2014-01-25 DIAGNOSIS — Z96641 Presence of right artificial hip joint: Secondary | ICD-10-CM | POA: Diagnosis not present

## 2014-01-25 DIAGNOSIS — I4891 Unspecified atrial fibrillation: Secondary | ICD-10-CM | POA: Diagnosis not present

## 2014-01-28 DIAGNOSIS — L97409 Non-pressure chronic ulcer of unspecified heel and midfoot with unspecified severity: Secondary | ICD-10-CM | POA: Diagnosis not present

## 2014-01-28 DIAGNOSIS — T888XXD Other specified complications of surgical and medical care, not elsewhere classified, subsequent encounter: Secondary | ICD-10-CM | POA: Diagnosis not present

## 2014-01-28 DIAGNOSIS — Z96641 Presence of right artificial hip joint: Secondary | ICD-10-CM | POA: Diagnosis not present

## 2014-01-28 DIAGNOSIS — L97529 Non-pressure chronic ulcer of other part of left foot with unspecified severity: Secondary | ICD-10-CM | POA: Diagnosis not present

## 2014-01-30 DIAGNOSIS — Z96641 Presence of right artificial hip joint: Secondary | ICD-10-CM | POA: Diagnosis not present

## 2014-01-30 DIAGNOSIS — T888XXD Other specified complications of surgical and medical care, not elsewhere classified, subsequent encounter: Secondary | ICD-10-CM | POA: Diagnosis not present

## 2014-02-01 DIAGNOSIS — T888XXD Other specified complications of surgical and medical care, not elsewhere classified, subsequent encounter: Secondary | ICD-10-CM | POA: Diagnosis not present

## 2014-02-01 DIAGNOSIS — Z96641 Presence of right artificial hip joint: Secondary | ICD-10-CM | POA: Diagnosis not present

## 2014-02-05 DIAGNOSIS — T888XXD Other specified complications of surgical and medical care, not elsewhere classified, subsequent encounter: Secondary | ICD-10-CM | POA: Diagnosis not present

## 2014-02-05 DIAGNOSIS — Z96641 Presence of right artificial hip joint: Secondary | ICD-10-CM | POA: Diagnosis not present

## 2014-02-06 DIAGNOSIS — Z96641 Presence of right artificial hip joint: Secondary | ICD-10-CM | POA: Diagnosis not present

## 2014-02-06 DIAGNOSIS — T888XXD Other specified complications of surgical and medical care, not elsewhere classified, subsequent encounter: Secondary | ICD-10-CM | POA: Diagnosis not present

## 2014-02-07 DIAGNOSIS — Z96641 Presence of right artificial hip joint: Secondary | ICD-10-CM | POA: Diagnosis not present

## 2014-02-07 DIAGNOSIS — T888XXD Other specified complications of surgical and medical care, not elsewhere classified, subsequent encounter: Secondary | ICD-10-CM | POA: Diagnosis not present

## 2014-02-11 DIAGNOSIS — T888XXD Other specified complications of surgical and medical care, not elsewhere classified, subsequent encounter: Secondary | ICD-10-CM | POA: Diagnosis not present

## 2014-02-11 DIAGNOSIS — Z96641 Presence of right artificial hip joint: Secondary | ICD-10-CM | POA: Diagnosis not present

## 2014-02-12 DIAGNOSIS — T847XXS Infection and inflammatory reaction due to other internal orthopedic prosthetic devices, implants and grafts, sequela: Secondary | ICD-10-CM | POA: Diagnosis not present

## 2014-02-12 DIAGNOSIS — L97529 Non-pressure chronic ulcer of other part of left foot with unspecified severity: Secondary | ICD-10-CM | POA: Diagnosis not present

## 2014-02-12 DIAGNOSIS — G5791 Unspecified mononeuropathy of right lower limb: Secondary | ICD-10-CM | POA: Diagnosis not present

## 2014-02-12 DIAGNOSIS — L98494 Non-pressure chronic ulcer of skin of other sites with necrosis of bone: Secondary | ICD-10-CM | POA: Diagnosis not present

## 2014-02-12 DIAGNOSIS — G5792 Unspecified mononeuropathy of left lower limb: Secondary | ICD-10-CM | POA: Diagnosis not present

## 2014-02-12 DIAGNOSIS — M21962 Unspecified acquired deformity of left lower leg: Secondary | ICD-10-CM | POA: Diagnosis not present

## 2014-02-13 DIAGNOSIS — T888XXD Other specified complications of surgical and medical care, not elsewhere classified, subsequent encounter: Secondary | ICD-10-CM | POA: Diagnosis not present

## 2014-02-13 DIAGNOSIS — Z96641 Presence of right artificial hip joint: Secondary | ICD-10-CM | POA: Diagnosis not present

## 2014-02-15 ENCOUNTER — Encounter: Payer: Self-pay | Admitting: Neurology

## 2014-02-15 ENCOUNTER — Ambulatory Visit (INDEPENDENT_AMBULATORY_CARE_PROVIDER_SITE_OTHER): Payer: Medicare Other | Admitting: Neurology

## 2014-02-15 VITALS — BP 110/72 | HR 53 | Ht 71.0 in | Wt 219.8 lb

## 2014-02-15 DIAGNOSIS — G63 Polyneuropathy in diseases classified elsewhere: Secondary | ICD-10-CM | POA: Diagnosis not present

## 2014-02-15 DIAGNOSIS — R269 Unspecified abnormalities of gait and mobility: Secondary | ICD-10-CM | POA: Diagnosis not present

## 2014-02-15 NOTE — Patient Instructions (Signed)

## 2014-02-15 NOTE — Progress Notes (Signed)
Reason for visit: Peripheral neuropathy  Walter Bryant is an 80 y.o. male  History of present illness:  Walter Bryant is an 80 year old right-handed white male with a history of a severe peripheral neuropathy associated with lumbosacral spondylosis and a prior right total hip replacement. The total hip replacement was complicated by a chronic inflammatory/infectious process that is currently being treated through the wound center at [facility]. The patient is using a walker for ambulation, and he is doing well with this. He has not had any falls. He does have some low back pain if he is up for a long period of time. The patient is working with physical therapy for leg strengthening and balance which has been quite helpful. The patient denies any significant discomfort with his neuropathy at nighttime, and he is able to rest. He is considering undergoing surgery for his abdominal aortic aneurysm within the next several months. The patient follows up to this office for an evaluation today.  Past Medical History  Diagnosis Date  . Arthritis   . GERD (gastroesophageal reflux disease)   . Hyperlipidemia   . Hypertension   . Polyneuropathy in other diseases classified elsewhere 12/20/2013  . Abnormality of gait 12/20/2013  . AAA (abdominal aortic aneurysm)   . A-fib   . BPH (benign prostatic hyperplasia)   . Vertigo   . History of concussion   . Right clavicle fracture   . Left tibial fracture   . Macular degeneration Left  . Barrett's esophagus   . Degenerative arthritis   . Transient global amnesia   . HOH (hard of hearing)   . Anxiety and depression     Past Surgical History  Procedure Laterality Date  . Tonsillectomy and adenoidectomy    . Knee surgery      both  knees replaced  . Wrist fracture surgery    . Total hip arthroplasty      right  . Appendectomy    . Cataract extraction      Bilateral  . Laparoscopic right hemi colectomy      Family History  Problem Relation Age  of Onset  . Heart disease Sister     AFIB  . Neuropathy Neg Hx     Social history:  reports that he quit smoking about 47 years ago. He has never used smokeless tobacco. He reports that he does not drink alcohol or use illicit drugs.    Allergies  Allergen Reactions  . Morphine   . Other     vicroyl sutures  . Oxycodone     Medications:  Current Outpatient Prescriptions on File Prior to Visit  Medication Sig Dispense Refill  . dabigatran (PRADAXA) 150 MG CAPS capsule Take 150 mg by mouth 2 (two) times daily.      Marland Kitchen ibuprofen (ADVIL,MOTRIN) 200 MG tablet Take by mouth. 600 mg each morning.      . metoprolol succinate (TOPROL-XL) 50 MG 24 hr tablet Take 50 mg by mouth daily. Take with or immediately following a meal.      . omeprazole (PRILOSEC) 20 MG capsule Take 20 mg by mouth daily.      . pregabalin (LYRICA) 75 MG capsule Take 75 mg by mouth 4 (four) times daily as needed.      . ranitidine (ZANTAC) 150 MG tablet Take 150 mg by mouth 2 (two) times daily.      . silver sulfADIAZINE (SILVADENE) 1 % cream       . simvastatin (  ZOCOR) 20 MG tablet       . tamsulosin (FLOMAX) 0.4 MG CAPS capsule Take 0.4 mg by mouth.       No current facility-administered medications on file prior to visit.    ROS:  Out of a complete 14 system review of symptoms, the patient complains only of the following symptoms, and all other reviewed systems are negative.  Joint pain, achy muscles, walking difficulties Numbness, weakness Gait instability  Blood pressure 110/72, pulse 53, height 5\' 11"  (1.803 m), weight 219 lb 12.8 oz (99.701 kg).  Physical Exam  General: The patient is alert and cooperative at the time of the examination.  Skin: No significant peripheral edema is noted. Bilateral AFO braces are in place.   Neurologic Exam  Mental status: The patient is oriented x 3.  Cranial nerves: Facial symmetry is present. Speech is normal, no aphasia or dysarthria is noted. Extraocular  movements are full. Visual fields are full.  Motor: The patient has good strength in all 4 extremities, with exception that there are bilateral foot drops, and mild weakness of the intrinsic muscles of the hands bilaterally. The patient has difficulty with flexion of the right hip, this generates pain. There is giveaway weakness.  Sensory examination: Soft touch sensation is symmetric on the face, arms, and legs.  Coordination: The patient has good finger-nose-finger bilaterally. The patient has difficulty performing heel-to-shin bilaterally, more issues on the right than the left.  Gait and station: The patient has a wide-based, unsteady gait. The patient uses a walker for ambulation. With the walker, he has good stability. Tandem gait was not attempted. Romberg is positive, the patient falls backwards. No drift is seen.  Reflexes: Deep tendon reflexes are symmetric, but are depressed throughout.   Assessment/Plan:  1. Peripheral neuropathy  2. Lumbosacral spondylosis  3. Right total hip replacement, chronic infection/inflammatory process  4. Leg length discrepancy, right leg shorter  5. Bilateral foot drops  The patient has a multifactorial gait disorder. The patient has a severe peripheral neuropathy, but he also has issues with the right hip that impair his ability to flex the hip. The patient has gained good improvement with physical therapy, and he will continue this. He will follow-up to this office in about 4 months. He is not having a lot of discomfort from the neuropathy at this time.  Jill Alexanders MD 02/15/2014 11:47 AM  [facility] [address] [place], [place] [address]  Phone [phone] Fax [phone]

## 2014-02-19 DIAGNOSIS — Z96641 Presence of right artificial hip joint: Secondary | ICD-10-CM | POA: Diagnosis not present

## 2014-02-19 DIAGNOSIS — T888XXD Other specified complications of surgical and medical care, not elsewhere classified, subsequent encounter: Secondary | ICD-10-CM | POA: Diagnosis not present

## 2014-02-20 DIAGNOSIS — Z96641 Presence of right artificial hip joint: Secondary | ICD-10-CM | POA: Diagnosis not present

## 2014-02-20 DIAGNOSIS — T888XXD Other specified complications of surgical and medical care, not elsewhere classified, subsequent encounter: Secondary | ICD-10-CM | POA: Diagnosis not present

## 2014-02-22 DIAGNOSIS — T847XXS Infection and inflammatory reaction due to other internal orthopedic prosthetic devices, implants and grafts, sequela: Secondary | ICD-10-CM | POA: Diagnosis not present

## 2014-02-22 DIAGNOSIS — G5792 Unspecified mononeuropathy of left lower limb: Secondary | ICD-10-CM | POA: Diagnosis not present

## 2014-02-22 DIAGNOSIS — L98494 Non-pressure chronic ulcer of skin of other sites with necrosis of bone: Secondary | ICD-10-CM | POA: Diagnosis not present

## 2014-02-22 DIAGNOSIS — M21962 Unspecified acquired deformity of left lower leg: Secondary | ICD-10-CM | POA: Diagnosis not present

## 2014-02-22 DIAGNOSIS — T8459XS Infection and inflammatory reaction due to other internal joint prosthesis, sequela: Secondary | ICD-10-CM | POA: Diagnosis not present

## 2014-02-22 DIAGNOSIS — G5791 Unspecified mononeuropathy of right lower limb: Secondary | ICD-10-CM | POA: Diagnosis not present

## 2014-02-22 DIAGNOSIS — L97529 Non-pressure chronic ulcer of other part of left foot with unspecified severity: Secondary | ICD-10-CM | POA: Diagnosis not present

## 2014-02-22 DIAGNOSIS — T8451XS Infection and inflammatory reaction due to internal right hip prosthesis, sequela: Secondary | ICD-10-CM | POA: Diagnosis not present

## 2014-02-26 DIAGNOSIS — Z96641 Presence of right artificial hip joint: Secondary | ICD-10-CM | POA: Diagnosis not present

## 2014-02-26 DIAGNOSIS — T888XXD Other specified complications of surgical and medical care, not elsewhere classified, subsequent encounter: Secondary | ICD-10-CM | POA: Diagnosis not present

## 2014-02-27 DIAGNOSIS — Z96641 Presence of right artificial hip joint: Secondary | ICD-10-CM | POA: Diagnosis not present

## 2014-02-27 DIAGNOSIS — T888XXD Other specified complications of surgical and medical care, not elsewhere classified, subsequent encounter: Secondary | ICD-10-CM | POA: Diagnosis not present

## 2014-03-06 ENCOUNTER — Encounter: Payer: Self-pay | Admitting: Neurology

## 2014-03-06 DIAGNOSIS — I714 Abdominal aortic aneurysm, without rupture: Secondary | ICD-10-CM | POA: Diagnosis not present

## 2014-03-06 DIAGNOSIS — I728 Aneurysm of other specified arteries: Secondary | ICD-10-CM | POA: Diagnosis not present

## 2014-03-07 DIAGNOSIS — T888XXD Other specified complications of surgical and medical care, not elsewhere classified, subsequent encounter: Secondary | ICD-10-CM | POA: Diagnosis not present

## 2014-03-07 DIAGNOSIS — Z96641 Presence of right artificial hip joint: Secondary | ICD-10-CM | POA: Diagnosis not present

## 2014-03-08 DIAGNOSIS — Z79899 Other long term (current) drug therapy: Secondary | ICD-10-CM | POA: Diagnosis not present

## 2014-03-08 DIAGNOSIS — L98494 Non-pressure chronic ulcer of skin of other sites with necrosis of bone: Secondary | ICD-10-CM | POA: Diagnosis not present

## 2014-03-08 DIAGNOSIS — Z87891 Personal history of nicotine dependence: Secondary | ICD-10-CM | POA: Diagnosis not present

## 2014-03-08 DIAGNOSIS — L97529 Non-pressure chronic ulcer of other part of left foot with unspecified severity: Secondary | ICD-10-CM | POA: Diagnosis not present

## 2014-03-08 DIAGNOSIS — M4800 Spinal stenosis, site unspecified: Secondary | ICD-10-CM | POA: Diagnosis not present

## 2014-03-08 DIAGNOSIS — T847XXD Infection and inflammatory reaction due to other internal orthopedic prosthetic devices, implants and grafts, subsequent encounter: Secondary | ICD-10-CM | POA: Diagnosis not present

## 2014-03-08 DIAGNOSIS — Z5181 Encounter for therapeutic drug level monitoring: Secondary | ICD-10-CM | POA: Diagnosis not present

## 2014-03-08 DIAGNOSIS — I4891 Unspecified atrial fibrillation: Secondary | ICD-10-CM | POA: Diagnosis not present

## 2014-03-08 DIAGNOSIS — Z7901 Long term (current) use of anticoagulants: Secondary | ICD-10-CM | POA: Diagnosis not present

## 2014-03-08 DIAGNOSIS — I714 Abdominal aortic aneurysm, without rupture: Secondary | ICD-10-CM | POA: Diagnosis not present

## 2014-03-12 ENCOUNTER — Encounter: Payer: Self-pay | Admitting: Neurology

## 2014-03-12 DIAGNOSIS — F419 Anxiety disorder, unspecified: Secondary | ICD-10-CM | POA: Diagnosis not present

## 2014-03-12 DIAGNOSIS — L98494 Non-pressure chronic ulcer of skin of other sites with necrosis of bone: Secondary | ICD-10-CM | POA: Diagnosis not present

## 2014-03-12 DIAGNOSIS — T847XXD Infection and inflammatory reaction due to other internal orthopedic prosthetic devices, implants and grafts, subsequent encounter: Secondary | ICD-10-CM | POA: Diagnosis not present

## 2014-03-12 DIAGNOSIS — Z87891 Personal history of nicotine dependence: Secondary | ICD-10-CM | POA: Diagnosis not present

## 2014-03-12 DIAGNOSIS — F329 Major depressive disorder, single episode, unspecified: Secondary | ICD-10-CM | POA: Diagnosis not present

## 2014-03-12 DIAGNOSIS — E785 Hyperlipidemia, unspecified: Secondary | ICD-10-CM | POA: Diagnosis not present

## 2014-03-12 DIAGNOSIS — M2510 Fistula, unspecified joint: Secondary | ICD-10-CM | POA: Diagnosis not present

## 2014-03-12 DIAGNOSIS — I4891 Unspecified atrial fibrillation: Secondary | ICD-10-CM | POA: Diagnosis not present

## 2014-03-13 DIAGNOSIS — T888XXD Other specified complications of surgical and medical care, not elsewhere classified, subsequent encounter: Secondary | ICD-10-CM | POA: Diagnosis not present

## 2014-03-13 DIAGNOSIS — Z96641 Presence of right artificial hip joint: Secondary | ICD-10-CM | POA: Diagnosis not present

## 2014-03-20 DIAGNOSIS — Z96641 Presence of right artificial hip joint: Secondary | ICD-10-CM | POA: Diagnosis not present

## 2014-03-20 DIAGNOSIS — T888XXD Other specified complications of surgical and medical care, not elsewhere classified, subsequent encounter: Secondary | ICD-10-CM | POA: Diagnosis not present

## 2014-03-22 DIAGNOSIS — L98494 Non-pressure chronic ulcer of skin of other sites with necrosis of bone: Secondary | ICD-10-CM | POA: Diagnosis not present

## 2014-03-22 DIAGNOSIS — B999 Unspecified infectious disease: Secondary | ICD-10-CM | POA: Diagnosis not present

## 2014-03-22 DIAGNOSIS — T847XXD Infection and inflammatory reaction due to other internal orthopedic prosthetic devices, implants and grafts, subsequent encounter: Secondary | ICD-10-CM | POA: Diagnosis not present

## 2014-03-25 DIAGNOSIS — L97529 Non-pressure chronic ulcer of other part of left foot with unspecified severity: Secondary | ICD-10-CM | POA: Diagnosis not present

## 2014-03-27 DIAGNOSIS — Z96641 Presence of right artificial hip joint: Secondary | ICD-10-CM | POA: Diagnosis not present

## 2014-03-27 DIAGNOSIS — L821 Other seborrheic keratosis: Secondary | ICD-10-CM | POA: Diagnosis not present

## 2014-03-27 DIAGNOSIS — T888XXD Other specified complications of surgical and medical care, not elsewhere classified, subsequent encounter: Secondary | ICD-10-CM | POA: Diagnosis not present

## 2014-03-27 DIAGNOSIS — L570 Actinic keratosis: Secondary | ICD-10-CM | POA: Diagnosis not present

## 2014-03-27 DIAGNOSIS — Q833 Accessory nipple: Secondary | ICD-10-CM | POA: Diagnosis not present

## 2014-04-01 DIAGNOSIS — Z96641 Presence of right artificial hip joint: Secondary | ICD-10-CM | POA: Diagnosis not present

## 2014-04-01 DIAGNOSIS — T888XXD Other specified complications of surgical and medical care, not elsewhere classified, subsequent encounter: Secondary | ICD-10-CM | POA: Diagnosis not present

## 2014-04-02 DIAGNOSIS — T888XXD Other specified complications of surgical and medical care, not elsewhere classified, subsequent encounter: Secondary | ICD-10-CM | POA: Diagnosis not present

## 2014-04-02 DIAGNOSIS — Z96641 Presence of right artificial hip joint: Secondary | ICD-10-CM | POA: Diagnosis not present

## 2014-04-03 DIAGNOSIS — S31109S Unspecified open wound of abdominal wall, unspecified quadrant without penetration into peritoneal cavity, sequela: Secondary | ICD-10-CM | POA: Diagnosis not present

## 2014-04-03 DIAGNOSIS — I480 Paroxysmal atrial fibrillation: Secondary | ICD-10-CM | POA: Diagnosis not present

## 2014-04-03 DIAGNOSIS — G629 Polyneuropathy, unspecified: Secondary | ICD-10-CM | POA: Diagnosis not present

## 2014-04-03 DIAGNOSIS — E780 Pure hypercholesterolemia: Secondary | ICD-10-CM | POA: Diagnosis not present

## 2014-04-10 DIAGNOSIS — T888XXD Other specified complications of surgical and medical care, not elsewhere classified, subsequent encounter: Secondary | ICD-10-CM | POA: Diagnosis not present

## 2014-04-10 DIAGNOSIS — Z96641 Presence of right artificial hip joint: Secondary | ICD-10-CM | POA: Diagnosis not present

## 2014-04-16 DIAGNOSIS — Z5181 Encounter for therapeutic drug level monitoring: Secondary | ICD-10-CM | POA: Diagnosis not present

## 2014-04-17 DIAGNOSIS — T888XXD Other specified complications of surgical and medical care, not elsewhere classified, subsequent encounter: Secondary | ICD-10-CM | POA: Diagnosis not present

## 2014-04-17 DIAGNOSIS — Z96641 Presence of right artificial hip joint: Secondary | ICD-10-CM | POA: Diagnosis not present

## 2014-04-19 HISTORY — PX: ABDOMINAL AORTIC ANEURYSM REPAIR: SUR1152

## 2014-04-24 DIAGNOSIS — S31109S Unspecified open wound of abdominal wall, unspecified quadrant without penetration into peritoneal cavity, sequela: Secondary | ICD-10-CM | POA: Diagnosis not present

## 2014-04-24 DIAGNOSIS — M19079 Primary osteoarthritis, unspecified ankle and foot: Secondary | ICD-10-CM | POA: Diagnosis not present

## 2014-04-24 DIAGNOSIS — T888XXD Other specified complications of surgical and medical care, not elsewhere classified, subsequent encounter: Secondary | ICD-10-CM | POA: Diagnosis not present

## 2014-04-24 DIAGNOSIS — I482 Chronic atrial fibrillation: Secondary | ICD-10-CM | POA: Diagnosis not present

## 2014-04-24 DIAGNOSIS — M869 Osteomyelitis, unspecified: Secondary | ICD-10-CM | POA: Diagnosis not present

## 2014-04-24 DIAGNOSIS — Z96641 Presence of right artificial hip joint: Secondary | ICD-10-CM | POA: Diagnosis not present

## 2014-05-01 DIAGNOSIS — T888XXD Other specified complications of surgical and medical care, not elsewhere classified, subsequent encounter: Secondary | ICD-10-CM | POA: Diagnosis not present

## 2014-05-01 DIAGNOSIS — Z96641 Presence of right artificial hip joint: Secondary | ICD-10-CM | POA: Diagnosis not present

## 2014-05-08 DIAGNOSIS — T8450XA Infection and inflammatory reaction due to unspecified internal joint prosthesis, initial encounter: Secondary | ICD-10-CM | POA: Diagnosis not present

## 2014-05-08 DIAGNOSIS — I4891 Unspecified atrial fibrillation: Secondary | ICD-10-CM | POA: Diagnosis not present

## 2014-05-08 DIAGNOSIS — Z96641 Presence of right artificial hip joint: Secondary | ICD-10-CM | POA: Diagnosis not present

## 2014-05-08 DIAGNOSIS — T888XXD Other specified complications of surgical and medical care, not elsewhere classified, subsequent encounter: Secondary | ICD-10-CM | POA: Diagnosis not present

## 2014-05-08 DIAGNOSIS — Z01818 Encounter for other preprocedural examination: Secondary | ICD-10-CM | POA: Diagnosis not present

## 2014-05-08 DIAGNOSIS — R7309 Other abnormal glucose: Secondary | ICD-10-CM | POA: Diagnosis not present

## 2014-05-09 DIAGNOSIS — I510 Cardiac septal defect, acquired: Secondary | ICD-10-CM | POA: Diagnosis not present

## 2014-05-15 DIAGNOSIS — T888XXD Other specified complications of surgical and medical care, not elsewhere classified, subsequent encounter: Secondary | ICD-10-CM | POA: Diagnosis not present

## 2014-05-15 DIAGNOSIS — I10 Essential (primary) hypertension: Secondary | ICD-10-CM | POA: Diagnosis not present

## 2014-05-15 DIAGNOSIS — Z01810 Encounter for preprocedural cardiovascular examination: Secondary | ICD-10-CM | POA: Diagnosis not present

## 2014-05-15 DIAGNOSIS — Z96641 Presence of right artificial hip joint: Secondary | ICD-10-CM | POA: Diagnosis not present

## 2014-05-15 DIAGNOSIS — I4891 Unspecified atrial fibrillation: Secondary | ICD-10-CM | POA: Diagnosis not present

## 2014-05-15 DIAGNOSIS — I714 Abdominal aortic aneurysm, without rupture: Secondary | ICD-10-CM | POA: Diagnosis not present

## 2014-05-20 ENCOUNTER — Encounter: Payer: Self-pay | Admitting: Neurology

## 2014-05-20 DIAGNOSIS — L97521 Non-pressure chronic ulcer of other part of left foot limited to breakdown of skin: Secondary | ICD-10-CM | POA: Diagnosis not present

## 2014-05-20 DIAGNOSIS — M21079 Valgus deformity, not elsewhere classified, unspecified ankle: Secondary | ICD-10-CM | POA: Diagnosis not present

## 2014-05-21 DIAGNOSIS — I714 Abdominal aortic aneurysm, without rupture: Secondary | ICD-10-CM | POA: Diagnosis not present

## 2014-05-21 DIAGNOSIS — I4891 Unspecified atrial fibrillation: Secondary | ICD-10-CM | POA: Diagnosis not present

## 2014-05-21 DIAGNOSIS — I10 Essential (primary) hypertension: Secondary | ICD-10-CM | POA: Diagnosis not present

## 2014-05-21 DIAGNOSIS — E785 Hyperlipidemia, unspecified: Secondary | ICD-10-CM | POA: Diagnosis not present

## 2014-05-22 DIAGNOSIS — Z96641 Presence of right artificial hip joint: Secondary | ICD-10-CM | POA: Diagnosis not present

## 2014-05-22 DIAGNOSIS — T888XXD Other specified complications of surgical and medical care, not elsewhere classified, subsequent encounter: Secondary | ICD-10-CM | POA: Diagnosis not present

## 2014-05-27 DIAGNOSIS — Z9049 Acquired absence of other specified parts of digestive tract: Secondary | ICD-10-CM | POA: Diagnosis present

## 2014-05-27 DIAGNOSIS — I4891 Unspecified atrial fibrillation: Secondary | ICD-10-CM | POA: Diagnosis present

## 2014-05-27 DIAGNOSIS — M25551 Pain in right hip: Secondary | ICD-10-CM | POA: Diagnosis not present

## 2014-05-27 DIAGNOSIS — E785 Hyperlipidemia, unspecified: Secondary | ICD-10-CM | POA: Diagnosis present

## 2014-05-27 DIAGNOSIS — Z9849 Cataract extraction status, unspecified eye: Secondary | ICD-10-CM | POA: Diagnosis not present

## 2014-05-27 DIAGNOSIS — G629 Polyneuropathy, unspecified: Secondary | ICD-10-CM | POA: Diagnosis present

## 2014-05-27 DIAGNOSIS — T8183XA Persistent postprocedural fistula, initial encounter: Secondary | ICD-10-CM | POA: Diagnosis present

## 2014-05-27 DIAGNOSIS — R7309 Other abnormal glucose: Secondary | ICD-10-CM | POA: Diagnosis not present

## 2014-05-27 DIAGNOSIS — H832X9 Labyrinthine dysfunction, unspecified ear: Secondary | ICD-10-CM | POA: Diagnosis not present

## 2014-05-27 DIAGNOSIS — K219 Gastro-esophageal reflux disease without esophagitis: Secondary | ICD-10-CM | POA: Diagnosis present

## 2014-05-27 DIAGNOSIS — I714 Abdominal aortic aneurysm, without rupture: Secondary | ICD-10-CM | POA: Diagnosis present

## 2014-05-27 DIAGNOSIS — K4090 Unilateral inguinal hernia, without obstruction or gangrene, not specified as recurrent: Secondary | ICD-10-CM | POA: Diagnosis not present

## 2014-05-27 DIAGNOSIS — G5790 Unspecified mononeuropathy of unspecified lower limb: Secondary | ICD-10-CM | POA: Diagnosis present

## 2014-05-27 DIAGNOSIS — Z7409 Other reduced mobility: Secondary | ICD-10-CM | POA: Diagnosis not present

## 2014-05-27 DIAGNOSIS — Z79899 Other long term (current) drug therapy: Secondary | ICD-10-CM | POA: Diagnosis not present

## 2014-05-27 DIAGNOSIS — Z4732 Aftercare following explantation of hip joint prosthesis: Secondary | ICD-10-CM | POA: Diagnosis not present

## 2014-05-27 DIAGNOSIS — R748 Abnormal levels of other serum enzymes: Secondary | ICD-10-CM | POA: Diagnosis not present

## 2014-05-27 DIAGNOSIS — Z8249 Family history of ischemic heart disease and other diseases of the circulatory system: Secondary | ICD-10-CM | POA: Diagnosis not present

## 2014-05-27 DIAGNOSIS — R531 Weakness: Secondary | ICD-10-CM | POA: Diagnosis not present

## 2014-05-27 DIAGNOSIS — M4800 Spinal stenosis, site unspecified: Secondary | ICD-10-CM | POA: Diagnosis not present

## 2014-05-27 DIAGNOSIS — E669 Obesity, unspecified: Secondary | ICD-10-CM | POA: Diagnosis present

## 2014-05-27 DIAGNOSIS — M21371 Foot drop, right foot: Secondary | ICD-10-CM | POA: Diagnosis present

## 2014-05-27 DIAGNOSIS — Z96653 Presence of artificial knee joint, bilateral: Secondary | ICD-10-CM | POA: Diagnosis present

## 2014-05-27 DIAGNOSIS — H9191 Unspecified hearing loss, right ear: Secondary | ICD-10-CM | POA: Diagnosis present

## 2014-05-27 DIAGNOSIS — H3530 Unspecified macular degeneration: Secondary | ICD-10-CM | POA: Diagnosis not present

## 2014-05-27 DIAGNOSIS — G8918 Other acute postprocedural pain: Secondary | ICD-10-CM | POA: Diagnosis not present

## 2014-05-27 DIAGNOSIS — I10 Essential (primary) hypertension: Secondary | ICD-10-CM | POA: Diagnosis present

## 2014-05-27 DIAGNOSIS — M159 Polyosteoarthritis, unspecified: Secondary | ICD-10-CM | POA: Diagnosis not present

## 2014-05-27 DIAGNOSIS — Z87891 Personal history of nicotine dependence: Secondary | ICD-10-CM | POA: Diagnosis not present

## 2014-05-27 DIAGNOSIS — Z6831 Body mass index (BMI) 31.0-31.9, adult: Secondary | ICD-10-CM | POA: Diagnosis not present

## 2014-05-27 DIAGNOSIS — L988 Other specified disorders of the skin and subcutaneous tissue: Secondary | ICD-10-CM | POA: Diagnosis not present

## 2014-05-27 DIAGNOSIS — M25159 Fistula, unspecified hip: Secondary | ICD-10-CM | POA: Diagnosis not present

## 2014-05-27 DIAGNOSIS — E784 Other hyperlipidemia: Secondary | ICD-10-CM | POA: Diagnosis not present

## 2014-05-27 DIAGNOSIS — T8451XA Infection and inflammatory reaction due to internal right hip prosthesis, initial encounter: Secondary | ICD-10-CM | POA: Diagnosis present

## 2014-05-27 DIAGNOSIS — R42 Dizziness and giddiness: Secondary | ICD-10-CM | POA: Diagnosis not present

## 2014-05-27 DIAGNOSIS — Z96641 Presence of right artificial hip joint: Secondary | ICD-10-CM | POA: Diagnosis present

## 2014-05-27 DIAGNOSIS — N400 Enlarged prostate without lower urinary tract symptoms: Secondary | ICD-10-CM | POA: Diagnosis present

## 2014-05-27 DIAGNOSIS — F329 Major depressive disorder, single episode, unspecified: Secondary | ICD-10-CM | POA: Diagnosis not present

## 2014-05-27 DIAGNOSIS — G609 Hereditary and idiopathic neuropathy, unspecified: Secondary | ICD-10-CM | POA: Diagnosis not present

## 2014-05-27 DIAGNOSIS — K2270 Barrett's esophagus without dysplasia: Secondary | ICD-10-CM | POA: Diagnosis present

## 2014-05-27 DIAGNOSIS — T8484XA Pain due to internal orthopedic prosthetic devices, implants and grafts, initial encounter: Secondary | ICD-10-CM | POA: Diagnosis not present

## 2014-05-27 DIAGNOSIS — T8450XA Infection and inflammatory reaction due to unspecified internal joint prosthesis, initial encounter: Secondary | ICD-10-CM | POA: Diagnosis not present

## 2014-05-31 DIAGNOSIS — F329 Major depressive disorder, single episode, unspecified: Secondary | ICD-10-CM | POA: Diagnosis not present

## 2014-05-31 DIAGNOSIS — G609 Hereditary and idiopathic neuropathy, unspecified: Secondary | ICD-10-CM | POA: Diagnosis not present

## 2014-05-31 DIAGNOSIS — Z96641 Presence of right artificial hip joint: Secondary | ICD-10-CM | POA: Diagnosis not present

## 2014-05-31 DIAGNOSIS — R7309 Other abnormal glucose: Secondary | ICD-10-CM | POA: Diagnosis not present

## 2014-05-31 DIAGNOSIS — H3530 Unspecified macular degeneration: Secondary | ICD-10-CM | POA: Diagnosis not present

## 2014-05-31 DIAGNOSIS — M4800 Spinal stenosis, site unspecified: Secondary | ICD-10-CM | POA: Diagnosis not present

## 2014-05-31 DIAGNOSIS — Z7409 Other reduced mobility: Secondary | ICD-10-CM | POA: Diagnosis not present

## 2014-05-31 DIAGNOSIS — B9561 Methicillin susceptible Staphylococcus aureus infection as the cause of diseases classified elsewhere: Secondary | ICD-10-CM | POA: Diagnosis not present

## 2014-05-31 DIAGNOSIS — Z9849 Cataract extraction status, unspecified eye: Secondary | ICD-10-CM | POA: Diagnosis not present

## 2014-05-31 DIAGNOSIS — I10 Essential (primary) hypertension: Secondary | ICD-10-CM | POA: Diagnosis not present

## 2014-05-31 DIAGNOSIS — H832X9 Labyrinthine dysfunction, unspecified ear: Secondary | ICD-10-CM | POA: Diagnosis not present

## 2014-05-31 DIAGNOSIS — Z4732 Aftercare following explantation of hip joint prosthesis: Secondary | ICD-10-CM | POA: Diagnosis not present

## 2014-05-31 DIAGNOSIS — I2510 Atherosclerotic heart disease of native coronary artery without angina pectoris: Secondary | ICD-10-CM | POA: Diagnosis not present

## 2014-05-31 DIAGNOSIS — S31109D Unspecified open wound of abdominal wall, unspecified quadrant without penetration into peritoneal cavity, subsequent encounter: Secondary | ICD-10-CM | POA: Diagnosis not present

## 2014-05-31 DIAGNOSIS — M25551 Pain in right hip: Secondary | ICD-10-CM | POA: Diagnosis not present

## 2014-05-31 DIAGNOSIS — M25159 Fistula, unspecified hip: Secondary | ICD-10-CM | POA: Diagnosis not present

## 2014-05-31 DIAGNOSIS — R42 Dizziness and giddiness: Secondary | ICD-10-CM | POA: Diagnosis not present

## 2014-05-31 DIAGNOSIS — R2689 Other abnormalities of gait and mobility: Secondary | ICD-10-CM | POA: Diagnosis not present

## 2014-05-31 DIAGNOSIS — T162XXA Foreign body in left ear, initial encounter: Secondary | ICD-10-CM | POA: Diagnosis not present

## 2014-05-31 DIAGNOSIS — Z89621 Acquired absence of right hip joint: Secondary | ICD-10-CM | POA: Diagnosis not present

## 2014-05-31 DIAGNOSIS — G8929 Other chronic pain: Secondary | ICD-10-CM | POA: Diagnosis not present

## 2014-05-31 DIAGNOSIS — R531 Weakness: Secondary | ICD-10-CM | POA: Diagnosis not present

## 2014-05-31 DIAGNOSIS — I4891 Unspecified atrial fibrillation: Secondary | ICD-10-CM | POA: Diagnosis not present

## 2014-05-31 DIAGNOSIS — T8451XS Infection and inflammatory reaction due to internal right hip prosthesis, sequela: Secondary | ICD-10-CM | POA: Diagnosis not present

## 2014-05-31 DIAGNOSIS — E8809 Other disorders of plasma-protein metabolism, not elsewhere classified: Secondary | ICD-10-CM | POA: Diagnosis not present

## 2014-05-31 DIAGNOSIS — M159 Polyosteoarthritis, unspecified: Secondary | ICD-10-CM | POA: Diagnosis not present

## 2014-05-31 DIAGNOSIS — E784 Other hyperlipidemia: Secondary | ICD-10-CM | POA: Diagnosis not present

## 2014-05-31 DIAGNOSIS — N400 Enlarged prostate without lower urinary tract symptoms: Secondary | ICD-10-CM | POA: Diagnosis not present

## 2014-05-31 DIAGNOSIS — T8450XA Infection and inflammatory reaction due to unspecified internal joint prosthesis, initial encounter: Secondary | ICD-10-CM | POA: Diagnosis not present

## 2014-05-31 DIAGNOSIS — T8451XA Infection and inflammatory reaction due to internal right hip prosthesis, initial encounter: Secondary | ICD-10-CM | POA: Diagnosis not present

## 2014-05-31 DIAGNOSIS — I714 Abdominal aortic aneurysm, without rupture: Secondary | ICD-10-CM | POA: Diagnosis not present

## 2014-05-31 DIAGNOSIS — T8451XD Infection and inflammatory reaction due to internal right hip prosthesis, subsequent encounter: Secondary | ICD-10-CM | POA: Diagnosis not present

## 2014-05-31 DIAGNOSIS — K4090 Unilateral inguinal hernia, without obstruction or gangrene, not specified as recurrent: Secondary | ICD-10-CM | POA: Diagnosis not present

## 2014-05-31 DIAGNOSIS — K2270 Barrett's esophagus without dysplasia: Secondary | ICD-10-CM | POA: Diagnosis not present

## 2014-05-31 DIAGNOSIS — T8450XD Infection and inflammatory reaction due to unspecified internal joint prosthesis, subsequent encounter: Secondary | ICD-10-CM | POA: Diagnosis not present

## 2014-05-31 DIAGNOSIS — Z4789 Encounter for other orthopedic aftercare: Secondary | ICD-10-CM | POA: Diagnosis not present

## 2014-06-03 DIAGNOSIS — R2689 Other abnormalities of gait and mobility: Secondary | ICD-10-CM | POA: Diagnosis not present

## 2014-06-03 DIAGNOSIS — M25551 Pain in right hip: Secondary | ICD-10-CM | POA: Diagnosis not present

## 2014-06-04 DIAGNOSIS — I10 Essential (primary) hypertension: Secondary | ICD-10-CM | POA: Diagnosis not present

## 2014-06-04 DIAGNOSIS — T8451XS Infection and inflammatory reaction due to internal right hip prosthesis, sequela: Secondary | ICD-10-CM | POA: Diagnosis not present

## 2014-06-04 DIAGNOSIS — G8929 Other chronic pain: Secondary | ICD-10-CM | POA: Diagnosis not present

## 2014-06-04 DIAGNOSIS — Z89621 Acquired absence of right hip joint: Secondary | ICD-10-CM | POA: Diagnosis not present

## 2014-06-04 DIAGNOSIS — I2510 Atherosclerotic heart disease of native coronary artery without angina pectoris: Secondary | ICD-10-CM | POA: Diagnosis not present

## 2014-06-13 DIAGNOSIS — T8451XD Infection and inflammatory reaction due to internal right hip prosthesis, subsequent encounter: Secondary | ICD-10-CM | POA: Diagnosis not present

## 2014-06-20 DIAGNOSIS — Z4732 Aftercare following explantation of hip joint prosthesis: Secondary | ICD-10-CM | POA: Diagnosis not present

## 2014-06-20 DIAGNOSIS — S31109D Unspecified open wound of abdominal wall, unspecified quadrant without penetration into peritoneal cavity, subsequent encounter: Secondary | ICD-10-CM | POA: Diagnosis not present

## 2014-06-21 DIAGNOSIS — T162XXA Foreign body in left ear, initial encounter: Secondary | ICD-10-CM | POA: Diagnosis not present

## 2014-06-27 ENCOUNTER — Ambulatory Visit: Payer: Medicare Other | Admitting: Neurology

## 2014-07-02 DIAGNOSIS — Z4789 Encounter for other orthopedic aftercare: Secondary | ICD-10-CM | POA: Diagnosis not present

## 2014-07-02 DIAGNOSIS — B9561 Methicillin susceptible Staphylococcus aureus infection as the cause of diseases classified elsewhere: Secondary | ICD-10-CM | POA: Diagnosis not present

## 2014-07-02 DIAGNOSIS — T8450XD Infection and inflammatory reaction due to unspecified internal joint prosthesis, subsequent encounter: Secondary | ICD-10-CM | POA: Diagnosis not present

## 2014-07-03 DIAGNOSIS — T8451XA Infection and inflammatory reaction due to internal right hip prosthesis, initial encounter: Secondary | ICD-10-CM | POA: Diagnosis not present

## 2014-07-03 DIAGNOSIS — I10 Essential (primary) hypertension: Secondary | ICD-10-CM | POA: Diagnosis not present

## 2014-07-03 DIAGNOSIS — E8809 Other disorders of plasma-protein metabolism, not elsewhere classified: Secondary | ICD-10-CM | POA: Diagnosis not present

## 2014-07-03 DIAGNOSIS — I4891 Unspecified atrial fibrillation: Secondary | ICD-10-CM | POA: Diagnosis not present

## 2014-07-10 DIAGNOSIS — I4891 Unspecified atrial fibrillation: Secondary | ICD-10-CM | POA: Diagnosis not present

## 2014-07-10 DIAGNOSIS — M150 Primary generalized (osteo)arthritis: Secondary | ICD-10-CM | POA: Diagnosis not present

## 2014-07-10 DIAGNOSIS — I2510 Atherosclerotic heart disease of native coronary artery without angina pectoris: Secondary | ICD-10-CM | POA: Diagnosis not present

## 2014-07-10 DIAGNOSIS — E669 Obesity, unspecified: Secondary | ICD-10-CM | POA: Diagnosis not present

## 2014-07-10 DIAGNOSIS — Z4732 Aftercare following explantation of hip joint prosthesis: Secondary | ICD-10-CM | POA: Diagnosis not present

## 2014-07-10 DIAGNOSIS — G629 Polyneuropathy, unspecified: Secondary | ICD-10-CM | POA: Diagnosis not present

## 2014-07-10 DIAGNOSIS — I10 Essential (primary) hypertension: Secondary | ICD-10-CM | POA: Diagnosis not present

## 2014-07-10 DIAGNOSIS — F329 Major depressive disorder, single episode, unspecified: Secondary | ICD-10-CM | POA: Diagnosis not present

## 2014-07-10 DIAGNOSIS — M14672 Charcot's joint, left ankle and foot: Secondary | ICD-10-CM | POA: Diagnosis not present

## 2014-07-10 DIAGNOSIS — K219 Gastro-esophageal reflux disease without esophagitis: Secondary | ICD-10-CM | POA: Diagnosis not present

## 2014-07-11 DIAGNOSIS — I2510 Atherosclerotic heart disease of native coronary artery without angina pectoris: Secondary | ICD-10-CM | POA: Diagnosis not present

## 2014-07-11 DIAGNOSIS — G5791 Unspecified mononeuropathy of right lower limb: Secondary | ICD-10-CM | POA: Diagnosis not present

## 2014-07-11 DIAGNOSIS — Z471 Aftercare following joint replacement surgery: Secondary | ICD-10-CM | POA: Diagnosis not present

## 2014-07-11 DIAGNOSIS — I4891 Unspecified atrial fibrillation: Secondary | ICD-10-CM | POA: Diagnosis not present

## 2014-07-11 DIAGNOSIS — G5792 Unspecified mononeuropathy of left lower limb: Secondary | ICD-10-CM | POA: Diagnosis not present

## 2014-07-11 DIAGNOSIS — G629 Polyneuropathy, unspecified: Secondary | ICD-10-CM | POA: Diagnosis not present

## 2014-07-11 DIAGNOSIS — Z888 Allergy status to other drugs, medicaments and biological substances status: Secondary | ICD-10-CM | POA: Diagnosis not present

## 2014-07-11 DIAGNOSIS — Z8614 Personal history of Methicillin resistant Staphylococcus aureus infection: Secondary | ICD-10-CM | POA: Diagnosis not present

## 2014-07-11 DIAGNOSIS — T8451XA Infection and inflammatory reaction due to internal right hip prosthesis, initial encounter: Secondary | ICD-10-CM | POA: Diagnosis not present

## 2014-07-11 DIAGNOSIS — Z87891 Personal history of nicotine dependence: Secondary | ICD-10-CM | POA: Diagnosis not present

## 2014-07-11 DIAGNOSIS — E669 Obesity, unspecified: Secondary | ICD-10-CM | POA: Diagnosis not present

## 2014-07-11 DIAGNOSIS — A5216 Charcot's arthropathy (tabetic): Secondary | ICD-10-CM | POA: Diagnosis not present

## 2014-07-11 DIAGNOSIS — Z96641 Presence of right artificial hip joint: Secondary | ICD-10-CM | POA: Diagnosis not present

## 2014-07-11 DIAGNOSIS — Z885 Allergy status to narcotic agent status: Secondary | ICD-10-CM | POA: Diagnosis not present

## 2014-07-11 DIAGNOSIS — I10 Essential (primary) hypertension: Secondary | ICD-10-CM | POA: Diagnosis not present

## 2014-07-11 DIAGNOSIS — Z4732 Aftercare following explantation of hip joint prosthesis: Secondary | ICD-10-CM | POA: Diagnosis not present

## 2014-07-11 DIAGNOSIS — M14672 Charcot's joint, left ankle and foot: Secondary | ICD-10-CM | POA: Diagnosis not present

## 2014-07-12 DIAGNOSIS — Z4732 Aftercare following explantation of hip joint prosthesis: Secondary | ICD-10-CM | POA: Diagnosis not present

## 2014-07-12 DIAGNOSIS — G629 Polyneuropathy, unspecified: Secondary | ICD-10-CM | POA: Diagnosis not present

## 2014-07-12 DIAGNOSIS — I2510 Atherosclerotic heart disease of native coronary artery without angina pectoris: Secondary | ICD-10-CM | POA: Diagnosis not present

## 2014-07-12 DIAGNOSIS — I10 Essential (primary) hypertension: Secondary | ICD-10-CM | POA: Diagnosis not present

## 2014-07-12 DIAGNOSIS — I4891 Unspecified atrial fibrillation: Secondary | ICD-10-CM | POA: Diagnosis not present

## 2014-07-12 DIAGNOSIS — M14672 Charcot's joint, left ankle and foot: Secondary | ICD-10-CM | POA: Diagnosis not present

## 2014-07-15 DIAGNOSIS — G629 Polyneuropathy, unspecified: Secondary | ICD-10-CM | POA: Diagnosis not present

## 2014-07-15 DIAGNOSIS — I4891 Unspecified atrial fibrillation: Secondary | ICD-10-CM | POA: Diagnosis not present

## 2014-07-15 DIAGNOSIS — Z4732 Aftercare following explantation of hip joint prosthesis: Secondary | ICD-10-CM | POA: Diagnosis not present

## 2014-07-15 DIAGNOSIS — M14672 Charcot's joint, left ankle and foot: Secondary | ICD-10-CM | POA: Diagnosis not present

## 2014-07-15 DIAGNOSIS — I2510 Atherosclerotic heart disease of native coronary artery without angina pectoris: Secondary | ICD-10-CM | POA: Diagnosis not present

## 2014-07-15 DIAGNOSIS — I10 Essential (primary) hypertension: Secondary | ICD-10-CM | POA: Diagnosis not present

## 2014-07-17 DIAGNOSIS — R7309 Other abnormal glucose: Secondary | ICD-10-CM | POA: Diagnosis not present

## 2014-07-17 DIAGNOSIS — G629 Polyneuropathy, unspecified: Secondary | ICD-10-CM | POA: Diagnosis not present

## 2014-07-17 DIAGNOSIS — M14672 Charcot's joint, left ankle and foot: Secondary | ICD-10-CM | POA: Diagnosis not present

## 2014-07-17 DIAGNOSIS — I2510 Atherosclerotic heart disease of native coronary artery without angina pectoris: Secondary | ICD-10-CM | POA: Diagnosis not present

## 2014-07-17 DIAGNOSIS — Z4732 Aftercare following explantation of hip joint prosthesis: Secondary | ICD-10-CM | POA: Diagnosis not present

## 2014-07-17 DIAGNOSIS — S31109S Unspecified open wound of abdominal wall, unspecified quadrant without penetration into peritoneal cavity, sequela: Secondary | ICD-10-CM | POA: Diagnosis not present

## 2014-07-17 DIAGNOSIS — I10 Essential (primary) hypertension: Secondary | ICD-10-CM | POA: Diagnosis not present

## 2014-07-17 DIAGNOSIS — I4891 Unspecified atrial fibrillation: Secondary | ICD-10-CM | POA: Diagnosis not present

## 2014-07-17 DIAGNOSIS — L03319 Cellulitis of trunk, unspecified: Secondary | ICD-10-CM | POA: Diagnosis not present

## 2014-07-18 DIAGNOSIS — M14672 Charcot's joint, left ankle and foot: Secondary | ICD-10-CM | POA: Diagnosis not present

## 2014-07-18 DIAGNOSIS — Z4732 Aftercare following explantation of hip joint prosthesis: Secondary | ICD-10-CM | POA: Diagnosis not present

## 2014-07-18 DIAGNOSIS — G629 Polyneuropathy, unspecified: Secondary | ICD-10-CM | POA: Diagnosis not present

## 2014-07-18 DIAGNOSIS — I10 Essential (primary) hypertension: Secondary | ICD-10-CM | POA: Diagnosis not present

## 2014-07-18 DIAGNOSIS — I4891 Unspecified atrial fibrillation: Secondary | ICD-10-CM | POA: Diagnosis not present

## 2014-07-18 DIAGNOSIS — I2510 Atherosclerotic heart disease of native coronary artery without angina pectoris: Secondary | ICD-10-CM | POA: Diagnosis not present

## 2014-07-22 DIAGNOSIS — I2510 Atherosclerotic heart disease of native coronary artery without angina pectoris: Secondary | ICD-10-CM | POA: Diagnosis not present

## 2014-07-22 DIAGNOSIS — M14672 Charcot's joint, left ankle and foot: Secondary | ICD-10-CM | POA: Diagnosis not present

## 2014-07-22 DIAGNOSIS — I10 Essential (primary) hypertension: Secondary | ICD-10-CM | POA: Diagnosis not present

## 2014-07-22 DIAGNOSIS — G629 Polyneuropathy, unspecified: Secondary | ICD-10-CM | POA: Diagnosis not present

## 2014-07-22 DIAGNOSIS — I4891 Unspecified atrial fibrillation: Secondary | ICD-10-CM | POA: Diagnosis not present

## 2014-07-22 DIAGNOSIS — Z4732 Aftercare following explantation of hip joint prosthesis: Secondary | ICD-10-CM | POA: Diagnosis not present

## 2014-07-24 DIAGNOSIS — I2510 Atherosclerotic heart disease of native coronary artery without angina pectoris: Secondary | ICD-10-CM | POA: Diagnosis not present

## 2014-07-24 DIAGNOSIS — I4891 Unspecified atrial fibrillation: Secondary | ICD-10-CM | POA: Diagnosis not present

## 2014-07-24 DIAGNOSIS — H3532 Exudative age-related macular degeneration: Secondary | ICD-10-CM | POA: Diagnosis not present

## 2014-07-24 DIAGNOSIS — Z4732 Aftercare following explantation of hip joint prosthesis: Secondary | ICD-10-CM | POA: Diagnosis not present

## 2014-07-24 DIAGNOSIS — G629 Polyneuropathy, unspecified: Secondary | ICD-10-CM | POA: Diagnosis not present

## 2014-07-24 DIAGNOSIS — I10 Essential (primary) hypertension: Secondary | ICD-10-CM | POA: Diagnosis not present

## 2014-07-24 DIAGNOSIS — H31012 Macula scars of posterior pole (postinflammatory) (post-traumatic), left eye: Secondary | ICD-10-CM | POA: Diagnosis not present

## 2014-07-24 DIAGNOSIS — M14672 Charcot's joint, left ankle and foot: Secondary | ICD-10-CM | POA: Diagnosis not present

## 2014-07-24 DIAGNOSIS — H3531 Nonexudative age-related macular degeneration: Secondary | ICD-10-CM | POA: Diagnosis not present

## 2014-07-25 DIAGNOSIS — Z4732 Aftercare following explantation of hip joint prosthesis: Secondary | ICD-10-CM | POA: Diagnosis not present

## 2014-07-25 DIAGNOSIS — I10 Essential (primary) hypertension: Secondary | ICD-10-CM | POA: Diagnosis not present

## 2014-07-25 DIAGNOSIS — I4891 Unspecified atrial fibrillation: Secondary | ICD-10-CM | POA: Diagnosis not present

## 2014-07-25 DIAGNOSIS — G629 Polyneuropathy, unspecified: Secondary | ICD-10-CM | POA: Diagnosis not present

## 2014-07-25 DIAGNOSIS — I2510 Atherosclerotic heart disease of native coronary artery without angina pectoris: Secondary | ICD-10-CM | POA: Diagnosis not present

## 2014-07-25 DIAGNOSIS — M14672 Charcot's joint, left ankle and foot: Secondary | ICD-10-CM | POA: Diagnosis not present

## 2014-07-29 DIAGNOSIS — G629 Polyneuropathy, unspecified: Secondary | ICD-10-CM | POA: Diagnosis not present

## 2014-07-29 DIAGNOSIS — I4891 Unspecified atrial fibrillation: Secondary | ICD-10-CM | POA: Diagnosis not present

## 2014-07-29 DIAGNOSIS — Z4732 Aftercare following explantation of hip joint prosthesis: Secondary | ICD-10-CM | POA: Diagnosis not present

## 2014-07-29 DIAGNOSIS — I10 Essential (primary) hypertension: Secondary | ICD-10-CM | POA: Diagnosis not present

## 2014-07-29 DIAGNOSIS — I2510 Atherosclerotic heart disease of native coronary artery without angina pectoris: Secondary | ICD-10-CM | POA: Diagnosis not present

## 2014-07-29 DIAGNOSIS — M14672 Charcot's joint, left ankle and foot: Secondary | ICD-10-CM | POA: Diagnosis not present

## 2014-08-01 DIAGNOSIS — I2510 Atherosclerotic heart disease of native coronary artery without angina pectoris: Secondary | ICD-10-CM | POA: Diagnosis not present

## 2014-08-01 DIAGNOSIS — G629 Polyneuropathy, unspecified: Secondary | ICD-10-CM | POA: Diagnosis not present

## 2014-08-01 DIAGNOSIS — I10 Essential (primary) hypertension: Secondary | ICD-10-CM | POA: Diagnosis not present

## 2014-08-01 DIAGNOSIS — Z4732 Aftercare following explantation of hip joint prosthesis: Secondary | ICD-10-CM | POA: Diagnosis not present

## 2014-08-01 DIAGNOSIS — I4891 Unspecified atrial fibrillation: Secondary | ICD-10-CM | POA: Diagnosis not present

## 2014-08-01 DIAGNOSIS — M14672 Charcot's joint, left ankle and foot: Secondary | ICD-10-CM | POA: Diagnosis not present

## 2014-08-08 DIAGNOSIS — I4891 Unspecified atrial fibrillation: Secondary | ICD-10-CM | POA: Diagnosis not present

## 2014-08-08 DIAGNOSIS — M14672 Charcot's joint, left ankle and foot: Secondary | ICD-10-CM | POA: Diagnosis not present

## 2014-08-08 DIAGNOSIS — I10 Essential (primary) hypertension: Secondary | ICD-10-CM | POA: Diagnosis not present

## 2014-08-08 DIAGNOSIS — I2510 Atherosclerotic heart disease of native coronary artery without angina pectoris: Secondary | ICD-10-CM | POA: Diagnosis not present

## 2014-08-08 DIAGNOSIS — Z4732 Aftercare following explantation of hip joint prosthesis: Secondary | ICD-10-CM | POA: Diagnosis not present

## 2014-08-08 DIAGNOSIS — G629 Polyneuropathy, unspecified: Secondary | ICD-10-CM | POA: Diagnosis not present

## 2014-08-14 DIAGNOSIS — I4891 Unspecified atrial fibrillation: Secondary | ICD-10-CM | POA: Diagnosis not present

## 2014-08-14 DIAGNOSIS — G629 Polyneuropathy, unspecified: Secondary | ICD-10-CM | POA: Diagnosis not present

## 2014-08-14 DIAGNOSIS — I2510 Atherosclerotic heart disease of native coronary artery without angina pectoris: Secondary | ICD-10-CM | POA: Diagnosis not present

## 2014-08-14 DIAGNOSIS — M14672 Charcot's joint, left ankle and foot: Secondary | ICD-10-CM | POA: Diagnosis not present

## 2014-08-14 DIAGNOSIS — Z4732 Aftercare following explantation of hip joint prosthesis: Secondary | ICD-10-CM | POA: Diagnosis not present

## 2014-08-14 DIAGNOSIS — I10 Essential (primary) hypertension: Secondary | ICD-10-CM | POA: Diagnosis not present

## 2014-08-15 DIAGNOSIS — G629 Polyneuropathy, unspecified: Secondary | ICD-10-CM | POA: Diagnosis not present

## 2014-08-15 DIAGNOSIS — M14672 Charcot's joint, left ankle and foot: Secondary | ICD-10-CM | POA: Diagnosis not present

## 2014-08-15 DIAGNOSIS — I2510 Atherosclerotic heart disease of native coronary artery without angina pectoris: Secondary | ICD-10-CM | POA: Diagnosis not present

## 2014-08-15 DIAGNOSIS — Z4732 Aftercare following explantation of hip joint prosthesis: Secondary | ICD-10-CM | POA: Diagnosis not present

## 2014-08-15 DIAGNOSIS — I10 Essential (primary) hypertension: Secondary | ICD-10-CM | POA: Diagnosis not present

## 2014-08-15 DIAGNOSIS — I4891 Unspecified atrial fibrillation: Secondary | ICD-10-CM | POA: Diagnosis not present

## 2014-08-16 DIAGNOSIS — I4891 Unspecified atrial fibrillation: Secondary | ICD-10-CM | POA: Diagnosis not present

## 2014-08-19 DIAGNOSIS — M19012 Primary osteoarthritis, left shoulder: Secondary | ICD-10-CM | POA: Diagnosis not present

## 2014-08-19 DIAGNOSIS — M25512 Pain in left shoulder: Secondary | ICD-10-CM | POA: Diagnosis not present

## 2014-08-19 DIAGNOSIS — M129 Arthropathy, unspecified: Secondary | ICD-10-CM | POA: Diagnosis not present

## 2014-08-19 DIAGNOSIS — Z87891 Personal history of nicotine dependence: Secondary | ICD-10-CM | POA: Diagnosis not present

## 2014-08-19 DIAGNOSIS — E785 Hyperlipidemia, unspecified: Secondary | ICD-10-CM | POA: Diagnosis not present

## 2014-08-19 DIAGNOSIS — M8580 Other specified disorders of bone density and structure, unspecified site: Secondary | ICD-10-CM | POA: Diagnosis not present

## 2014-08-19 DIAGNOSIS — I10 Essential (primary) hypertension: Secondary | ICD-10-CM | POA: Diagnosis not present

## 2014-08-19 DIAGNOSIS — M13812 Other specified arthritis, left shoulder: Secondary | ICD-10-CM | POA: Diagnosis not present

## 2014-08-22 DIAGNOSIS — Z4732 Aftercare following explantation of hip joint prosthesis: Secondary | ICD-10-CM | POA: Diagnosis not present

## 2014-08-22 DIAGNOSIS — I4891 Unspecified atrial fibrillation: Secondary | ICD-10-CM | POA: Diagnosis not present

## 2014-08-22 DIAGNOSIS — G629 Polyneuropathy, unspecified: Secondary | ICD-10-CM | POA: Diagnosis not present

## 2014-08-22 DIAGNOSIS — I2510 Atherosclerotic heart disease of native coronary artery without angina pectoris: Secondary | ICD-10-CM | POA: Diagnosis not present

## 2014-08-22 DIAGNOSIS — I10 Essential (primary) hypertension: Secondary | ICD-10-CM | POA: Diagnosis not present

## 2014-08-22 DIAGNOSIS — M14672 Charcot's joint, left ankle and foot: Secondary | ICD-10-CM | POA: Diagnosis not present

## 2014-08-28 DIAGNOSIS — I10 Essential (primary) hypertension: Secondary | ICD-10-CM | POA: Diagnosis not present

## 2014-08-28 DIAGNOSIS — I2510 Atherosclerotic heart disease of native coronary artery without angina pectoris: Secondary | ICD-10-CM | POA: Diagnosis not present

## 2014-08-28 DIAGNOSIS — M14672 Charcot's joint, left ankle and foot: Secondary | ICD-10-CM | POA: Diagnosis not present

## 2014-08-28 DIAGNOSIS — G629 Polyneuropathy, unspecified: Secondary | ICD-10-CM | POA: Diagnosis not present

## 2014-08-28 DIAGNOSIS — I4891 Unspecified atrial fibrillation: Secondary | ICD-10-CM | POA: Diagnosis not present

## 2014-08-28 DIAGNOSIS — Z4732 Aftercare following explantation of hip joint prosthesis: Secondary | ICD-10-CM | POA: Diagnosis not present

## 2014-09-02 DIAGNOSIS — G629 Polyneuropathy, unspecified: Secondary | ICD-10-CM | POA: Diagnosis not present

## 2014-09-02 DIAGNOSIS — I10 Essential (primary) hypertension: Secondary | ICD-10-CM | POA: Diagnosis not present

## 2014-09-02 DIAGNOSIS — I4891 Unspecified atrial fibrillation: Secondary | ICD-10-CM | POA: Diagnosis not present

## 2014-09-02 DIAGNOSIS — I2510 Atherosclerotic heart disease of native coronary artery without angina pectoris: Secondary | ICD-10-CM | POA: Diagnosis not present

## 2014-09-02 DIAGNOSIS — Z4732 Aftercare following explantation of hip joint prosthesis: Secondary | ICD-10-CM | POA: Diagnosis not present

## 2014-09-02 DIAGNOSIS — M14672 Charcot's joint, left ankle and foot: Secondary | ICD-10-CM | POA: Diagnosis not present

## 2014-09-04 DIAGNOSIS — I4891 Unspecified atrial fibrillation: Secondary | ICD-10-CM | POA: Diagnosis not present

## 2014-09-04 DIAGNOSIS — I2510 Atherosclerotic heart disease of native coronary artery without angina pectoris: Secondary | ICD-10-CM | POA: Diagnosis not present

## 2014-09-04 DIAGNOSIS — M14672 Charcot's joint, left ankle and foot: Secondary | ICD-10-CM | POA: Diagnosis not present

## 2014-09-04 DIAGNOSIS — G629 Polyneuropathy, unspecified: Secondary | ICD-10-CM | POA: Diagnosis not present

## 2014-09-04 DIAGNOSIS — I10 Essential (primary) hypertension: Secondary | ICD-10-CM | POA: Diagnosis not present

## 2014-09-04 DIAGNOSIS — Z4732 Aftercare following explantation of hip joint prosthesis: Secondary | ICD-10-CM | POA: Diagnosis not present

## 2014-09-05 DIAGNOSIS — G629 Polyneuropathy, unspecified: Secondary | ICD-10-CM | POA: Diagnosis not present

## 2014-09-05 DIAGNOSIS — I2510 Atherosclerotic heart disease of native coronary artery without angina pectoris: Secondary | ICD-10-CM | POA: Diagnosis not present

## 2014-09-05 DIAGNOSIS — I4891 Unspecified atrial fibrillation: Secondary | ICD-10-CM | POA: Diagnosis not present

## 2014-09-05 DIAGNOSIS — I10 Essential (primary) hypertension: Secondary | ICD-10-CM | POA: Diagnosis not present

## 2014-09-05 DIAGNOSIS — M14672 Charcot's joint, left ankle and foot: Secondary | ICD-10-CM | POA: Diagnosis not present

## 2014-09-05 DIAGNOSIS — Z4732 Aftercare following explantation of hip joint prosthesis: Secondary | ICD-10-CM | POA: Diagnosis not present

## 2014-09-06 DIAGNOSIS — G629 Polyneuropathy, unspecified: Secondary | ICD-10-CM | POA: Diagnosis not present

## 2014-09-06 DIAGNOSIS — I10 Essential (primary) hypertension: Secondary | ICD-10-CM | POA: Diagnosis not present

## 2014-09-06 DIAGNOSIS — Z4732 Aftercare following explantation of hip joint prosthesis: Secondary | ICD-10-CM | POA: Diagnosis not present

## 2014-09-06 DIAGNOSIS — I4891 Unspecified atrial fibrillation: Secondary | ICD-10-CM | POA: Diagnosis not present

## 2014-09-06 DIAGNOSIS — I2510 Atherosclerotic heart disease of native coronary artery without angina pectoris: Secondary | ICD-10-CM | POA: Diagnosis not present

## 2014-09-06 DIAGNOSIS — M14672 Charcot's joint, left ankle and foot: Secondary | ICD-10-CM | POA: Diagnosis not present

## 2014-09-08 DIAGNOSIS — M19012 Primary osteoarthritis, left shoulder: Secondary | ICD-10-CM | POA: Diagnosis not present

## 2014-09-08 DIAGNOSIS — F329 Major depressive disorder, single episode, unspecified: Secondary | ICD-10-CM | POA: Diagnosis not present

## 2014-09-08 DIAGNOSIS — M150 Primary generalized (osteo)arthritis: Secondary | ICD-10-CM | POA: Diagnosis not present

## 2014-09-08 DIAGNOSIS — I4891 Unspecified atrial fibrillation: Secondary | ICD-10-CM | POA: Diagnosis not present

## 2014-09-08 DIAGNOSIS — E669 Obesity, unspecified: Secondary | ICD-10-CM | POA: Diagnosis not present

## 2014-09-08 DIAGNOSIS — I2510 Atherosclerotic heart disease of native coronary artery without angina pectoris: Secondary | ICD-10-CM | POA: Diagnosis not present

## 2014-09-08 DIAGNOSIS — M75101 Unspecified rotator cuff tear or rupture of right shoulder, not specified as traumatic: Secondary | ICD-10-CM | POA: Diagnosis not present

## 2014-09-08 DIAGNOSIS — M14672 Charcot's joint, left ankle and foot: Secondary | ICD-10-CM | POA: Diagnosis not present

## 2014-09-08 DIAGNOSIS — G629 Polyneuropathy, unspecified: Secondary | ICD-10-CM | POA: Diagnosis not present

## 2014-09-08 DIAGNOSIS — I10 Essential (primary) hypertension: Secondary | ICD-10-CM | POA: Diagnosis not present

## 2014-09-08 DIAGNOSIS — Z4732 Aftercare following explantation of hip joint prosthesis: Secondary | ICD-10-CM | POA: Diagnosis not present

## 2014-09-08 DIAGNOSIS — K219 Gastro-esophageal reflux disease without esophagitis: Secondary | ICD-10-CM | POA: Diagnosis not present

## 2014-09-10 DIAGNOSIS — M25551 Pain in right hip: Secondary | ICD-10-CM | POA: Diagnosis not present

## 2014-09-11 DIAGNOSIS — Z4732 Aftercare following explantation of hip joint prosthesis: Secondary | ICD-10-CM | POA: Diagnosis not present

## 2014-09-11 DIAGNOSIS — G629 Polyneuropathy, unspecified: Secondary | ICD-10-CM | POA: Diagnosis not present

## 2014-09-11 DIAGNOSIS — M14672 Charcot's joint, left ankle and foot: Secondary | ICD-10-CM | POA: Diagnosis not present

## 2014-09-11 DIAGNOSIS — I4891 Unspecified atrial fibrillation: Secondary | ICD-10-CM | POA: Diagnosis not present

## 2014-09-11 DIAGNOSIS — M75101 Unspecified rotator cuff tear or rupture of right shoulder, not specified as traumatic: Secondary | ICD-10-CM | POA: Diagnosis not present

## 2014-09-11 DIAGNOSIS — M19012 Primary osteoarthritis, left shoulder: Secondary | ICD-10-CM | POA: Diagnosis not present

## 2014-09-12 DIAGNOSIS — M75101 Unspecified rotator cuff tear or rupture of right shoulder, not specified as traumatic: Secondary | ICD-10-CM | POA: Diagnosis not present

## 2014-09-12 DIAGNOSIS — G629 Polyneuropathy, unspecified: Secondary | ICD-10-CM | POA: Diagnosis not present

## 2014-09-12 DIAGNOSIS — I4891 Unspecified atrial fibrillation: Secondary | ICD-10-CM | POA: Diagnosis not present

## 2014-09-12 DIAGNOSIS — M14672 Charcot's joint, left ankle and foot: Secondary | ICD-10-CM | POA: Diagnosis not present

## 2014-09-12 DIAGNOSIS — M19012 Primary osteoarthritis, left shoulder: Secondary | ICD-10-CM | POA: Diagnosis not present

## 2014-09-12 DIAGNOSIS — Z4732 Aftercare following explantation of hip joint prosthesis: Secondary | ICD-10-CM | POA: Diagnosis not present

## 2014-09-13 DIAGNOSIS — G629 Polyneuropathy, unspecified: Secondary | ICD-10-CM | POA: Diagnosis not present

## 2014-09-13 DIAGNOSIS — Z4732 Aftercare following explantation of hip joint prosthesis: Secondary | ICD-10-CM | POA: Diagnosis not present

## 2014-09-13 DIAGNOSIS — M75101 Unspecified rotator cuff tear or rupture of right shoulder, not specified as traumatic: Secondary | ICD-10-CM | POA: Diagnosis not present

## 2014-09-13 DIAGNOSIS — M19012 Primary osteoarthritis, left shoulder: Secondary | ICD-10-CM | POA: Diagnosis not present

## 2014-09-13 DIAGNOSIS — M14672 Charcot's joint, left ankle and foot: Secondary | ICD-10-CM | POA: Diagnosis not present

## 2014-09-13 DIAGNOSIS — I4891 Unspecified atrial fibrillation: Secondary | ICD-10-CM | POA: Diagnosis not present

## 2014-09-17 DIAGNOSIS — Z4732 Aftercare following explantation of hip joint prosthesis: Secondary | ICD-10-CM | POA: Diagnosis not present

## 2014-09-17 DIAGNOSIS — M14672 Charcot's joint, left ankle and foot: Secondary | ICD-10-CM | POA: Diagnosis not present

## 2014-09-17 DIAGNOSIS — I4891 Unspecified atrial fibrillation: Secondary | ICD-10-CM | POA: Diagnosis not present

## 2014-09-17 DIAGNOSIS — M19012 Primary osteoarthritis, left shoulder: Secondary | ICD-10-CM | POA: Diagnosis not present

## 2014-09-17 DIAGNOSIS — M75101 Unspecified rotator cuff tear or rupture of right shoulder, not specified as traumatic: Secondary | ICD-10-CM | POA: Diagnosis not present

## 2014-09-17 DIAGNOSIS — G629 Polyneuropathy, unspecified: Secondary | ICD-10-CM | POA: Diagnosis not present

## 2014-09-18 DIAGNOSIS — Z96641 Presence of right artificial hip joint: Secondary | ICD-10-CM | POA: Diagnosis not present

## 2014-09-18 DIAGNOSIS — Z888 Allergy status to other drugs, medicaments and biological substances status: Secondary | ICD-10-CM | POA: Diagnosis not present

## 2014-09-18 DIAGNOSIS — Z96653 Presence of artificial knee joint, bilateral: Secondary | ICD-10-CM | POA: Diagnosis not present

## 2014-09-18 DIAGNOSIS — I4891 Unspecified atrial fibrillation: Secondary | ICD-10-CM | POA: Diagnosis not present

## 2014-09-18 DIAGNOSIS — T8450XD Infection and inflammatory reaction due to unspecified internal joint prosthesis, subsequent encounter: Secondary | ICD-10-CM | POA: Diagnosis not present

## 2014-09-18 DIAGNOSIS — I10 Essential (primary) hypertension: Secondary | ICD-10-CM | POA: Diagnosis not present

## 2014-09-18 DIAGNOSIS — Z87891 Personal history of nicotine dependence: Secondary | ICD-10-CM | POA: Diagnosis not present

## 2014-09-18 DIAGNOSIS — Z471 Aftercare following joint replacement surgery: Secondary | ICD-10-CM | POA: Diagnosis not present

## 2014-09-18 DIAGNOSIS — I714 Abdominal aortic aneurysm, without rupture: Secondary | ICD-10-CM | POA: Diagnosis not present

## 2014-09-18 DIAGNOSIS — Z885 Allergy status to narcotic agent status: Secondary | ICD-10-CM | POA: Diagnosis not present

## 2014-09-18 DIAGNOSIS — Z01818 Encounter for other preprocedural examination: Secondary | ICD-10-CM | POA: Diagnosis not present

## 2014-09-19 DIAGNOSIS — M19012 Primary osteoarthritis, left shoulder: Secondary | ICD-10-CM | POA: Diagnosis not present

## 2014-09-19 DIAGNOSIS — M14672 Charcot's joint, left ankle and foot: Secondary | ICD-10-CM | POA: Diagnosis not present

## 2014-09-19 DIAGNOSIS — M75101 Unspecified rotator cuff tear or rupture of right shoulder, not specified as traumatic: Secondary | ICD-10-CM | POA: Diagnosis not present

## 2014-09-19 DIAGNOSIS — I4891 Unspecified atrial fibrillation: Secondary | ICD-10-CM | POA: Diagnosis not present

## 2014-09-19 DIAGNOSIS — G629 Polyneuropathy, unspecified: Secondary | ICD-10-CM | POA: Diagnosis not present

## 2014-09-19 DIAGNOSIS — Z4732 Aftercare following explantation of hip joint prosthesis: Secondary | ICD-10-CM | POA: Diagnosis not present

## 2014-09-20 DIAGNOSIS — Z4732 Aftercare following explantation of hip joint prosthesis: Secondary | ICD-10-CM | POA: Diagnosis not present

## 2014-09-20 DIAGNOSIS — G629 Polyneuropathy, unspecified: Secondary | ICD-10-CM | POA: Diagnosis not present

## 2014-09-20 DIAGNOSIS — M75101 Unspecified rotator cuff tear or rupture of right shoulder, not specified as traumatic: Secondary | ICD-10-CM | POA: Diagnosis not present

## 2014-09-20 DIAGNOSIS — I4891 Unspecified atrial fibrillation: Secondary | ICD-10-CM | POA: Diagnosis not present

## 2014-09-20 DIAGNOSIS — M19012 Primary osteoarthritis, left shoulder: Secondary | ICD-10-CM | POA: Diagnosis not present

## 2014-09-20 DIAGNOSIS — M14672 Charcot's joint, left ankle and foot: Secondary | ICD-10-CM | POA: Diagnosis not present

## 2014-09-24 DIAGNOSIS — G629 Polyneuropathy, unspecified: Secondary | ICD-10-CM | POA: Diagnosis not present

## 2014-09-24 DIAGNOSIS — M75101 Unspecified rotator cuff tear or rupture of right shoulder, not specified as traumatic: Secondary | ICD-10-CM | POA: Diagnosis not present

## 2014-09-24 DIAGNOSIS — Z4732 Aftercare following explantation of hip joint prosthesis: Secondary | ICD-10-CM | POA: Diagnosis not present

## 2014-09-24 DIAGNOSIS — M14672 Charcot's joint, left ankle and foot: Secondary | ICD-10-CM | POA: Diagnosis not present

## 2014-09-24 DIAGNOSIS — I4891 Unspecified atrial fibrillation: Secondary | ICD-10-CM | POA: Diagnosis not present

## 2014-09-24 DIAGNOSIS — M19012 Primary osteoarthritis, left shoulder: Secondary | ICD-10-CM | POA: Diagnosis not present

## 2014-09-25 DIAGNOSIS — I719 Aortic aneurysm of unspecified site, without rupture: Secondary | ICD-10-CM | POA: Diagnosis not present

## 2014-09-25 DIAGNOSIS — T8451XD Infection and inflammatory reaction due to internal right hip prosthesis, subsequent encounter: Secondary | ICD-10-CM | POA: Diagnosis not present

## 2014-09-25 DIAGNOSIS — I714 Abdominal aortic aneurysm, without rupture: Secondary | ICD-10-CM | POA: Diagnosis not present

## 2014-09-26 DIAGNOSIS — M14672 Charcot's joint, left ankle and foot: Secondary | ICD-10-CM | POA: Diagnosis not present

## 2014-09-26 DIAGNOSIS — I4891 Unspecified atrial fibrillation: Secondary | ICD-10-CM | POA: Diagnosis not present

## 2014-09-26 DIAGNOSIS — G629 Polyneuropathy, unspecified: Secondary | ICD-10-CM | POA: Diagnosis not present

## 2014-09-26 DIAGNOSIS — Z4732 Aftercare following explantation of hip joint prosthesis: Secondary | ICD-10-CM | POA: Diagnosis not present

## 2014-09-26 DIAGNOSIS — M75101 Unspecified rotator cuff tear or rupture of right shoulder, not specified as traumatic: Secondary | ICD-10-CM | POA: Diagnosis not present

## 2014-09-26 DIAGNOSIS — M19012 Primary osteoarthritis, left shoulder: Secondary | ICD-10-CM | POA: Diagnosis not present

## 2014-09-27 DIAGNOSIS — M75101 Unspecified rotator cuff tear or rupture of right shoulder, not specified as traumatic: Secondary | ICD-10-CM | POA: Diagnosis not present

## 2014-09-27 DIAGNOSIS — Z4732 Aftercare following explantation of hip joint prosthesis: Secondary | ICD-10-CM | POA: Diagnosis not present

## 2014-09-27 DIAGNOSIS — G629 Polyneuropathy, unspecified: Secondary | ICD-10-CM | POA: Diagnosis not present

## 2014-09-27 DIAGNOSIS — I4891 Unspecified atrial fibrillation: Secondary | ICD-10-CM | POA: Diagnosis not present

## 2014-09-27 DIAGNOSIS — M14672 Charcot's joint, left ankle and foot: Secondary | ICD-10-CM | POA: Diagnosis not present

## 2014-09-27 DIAGNOSIS — M19012 Primary osteoarthritis, left shoulder: Secondary | ICD-10-CM | POA: Diagnosis not present

## 2014-09-30 DIAGNOSIS — Z4732 Aftercare following explantation of hip joint prosthesis: Secondary | ICD-10-CM | POA: Diagnosis not present

## 2014-09-30 DIAGNOSIS — M14672 Charcot's joint, left ankle and foot: Secondary | ICD-10-CM | POA: Diagnosis not present

## 2014-09-30 DIAGNOSIS — G629 Polyneuropathy, unspecified: Secondary | ICD-10-CM | POA: Diagnosis not present

## 2014-09-30 DIAGNOSIS — I4891 Unspecified atrial fibrillation: Secondary | ICD-10-CM | POA: Diagnosis not present

## 2014-09-30 DIAGNOSIS — M75101 Unspecified rotator cuff tear or rupture of right shoulder, not specified as traumatic: Secondary | ICD-10-CM | POA: Diagnosis not present

## 2014-09-30 DIAGNOSIS — M19012 Primary osteoarthritis, left shoulder: Secondary | ICD-10-CM | POA: Diagnosis not present

## 2014-10-02 DIAGNOSIS — M19012 Primary osteoarthritis, left shoulder: Secondary | ICD-10-CM | POA: Diagnosis not present

## 2014-10-02 DIAGNOSIS — I4891 Unspecified atrial fibrillation: Secondary | ICD-10-CM | POA: Diagnosis not present

## 2014-10-02 DIAGNOSIS — G629 Polyneuropathy, unspecified: Secondary | ICD-10-CM | POA: Diagnosis not present

## 2014-10-02 DIAGNOSIS — M14672 Charcot's joint, left ankle and foot: Secondary | ICD-10-CM | POA: Diagnosis not present

## 2014-10-02 DIAGNOSIS — M75101 Unspecified rotator cuff tear or rupture of right shoulder, not specified as traumatic: Secondary | ICD-10-CM | POA: Diagnosis not present

## 2014-10-02 DIAGNOSIS — Z4732 Aftercare following explantation of hip joint prosthesis: Secondary | ICD-10-CM | POA: Diagnosis not present

## 2014-10-15 DIAGNOSIS — Z7982 Long term (current) use of aspirin: Secondary | ICD-10-CM | POA: Diagnosis not present

## 2014-10-15 DIAGNOSIS — E784 Other hyperlipidemia: Secondary | ICD-10-CM | POA: Diagnosis not present

## 2014-10-15 DIAGNOSIS — T8450XA Infection and inflammatory reaction due to unspecified internal joint prosthesis, initial encounter: Secondary | ICD-10-CM | POA: Diagnosis not present

## 2014-10-15 DIAGNOSIS — Z471 Aftercare following joint replacement surgery: Secondary | ICD-10-CM | POA: Diagnosis not present

## 2014-10-15 DIAGNOSIS — R7309 Other abnormal glucose: Secondary | ICD-10-CM | POA: Diagnosis not present

## 2014-10-15 DIAGNOSIS — T8451XD Infection and inflammatory reaction due to internal right hip prosthesis, subsequent encounter: Secondary | ICD-10-CM | POA: Diagnosis not present

## 2014-10-15 DIAGNOSIS — G609 Hereditary and idiopathic neuropathy, unspecified: Secondary | ICD-10-CM | POA: Diagnosis not present

## 2014-10-15 DIAGNOSIS — T8451XA Infection and inflammatory reaction due to internal right hip prosthesis, initial encounter: Secondary | ICD-10-CM | POA: Diagnosis present

## 2014-10-15 DIAGNOSIS — Z4732 Aftercare following explantation of hip joint prosthesis: Secondary | ICD-10-CM | POA: Diagnosis not present

## 2014-10-15 DIAGNOSIS — H3530 Unspecified macular degeneration: Secondary | ICD-10-CM | POA: Diagnosis not present

## 2014-10-15 DIAGNOSIS — I714 Abdominal aortic aneurysm, without rupture: Secondary | ICD-10-CM | POA: Diagnosis present

## 2014-10-15 DIAGNOSIS — Z7409 Other reduced mobility: Secondary | ICD-10-CM | POA: Diagnosis not present

## 2014-10-15 DIAGNOSIS — M1460 Charcot's joint, unspecified site: Secondary | ICD-10-CM | POA: Diagnosis present

## 2014-10-15 DIAGNOSIS — K219 Gastro-esophageal reflux disease without esophagitis: Secondary | ICD-10-CM | POA: Diagnosis present

## 2014-10-15 DIAGNOSIS — T8489XD Other specified complication of internal orthopedic prosthetic devices, implants and grafts, subsequent encounter: Secondary | ICD-10-CM | POA: Diagnosis not present

## 2014-10-15 DIAGNOSIS — K4090 Unilateral inguinal hernia, without obstruction or gangrene, not specified as recurrent: Secondary | ICD-10-CM | POA: Diagnosis not present

## 2014-10-15 DIAGNOSIS — M4800 Spinal stenosis, site unspecified: Secondary | ICD-10-CM | POA: Diagnosis not present

## 2014-10-15 DIAGNOSIS — Z9841 Cataract extraction status, right eye: Secondary | ICD-10-CM | POA: Diagnosis not present

## 2014-10-15 DIAGNOSIS — Z96653 Presence of artificial knee joint, bilateral: Secondary | ICD-10-CM | POA: Diagnosis present

## 2014-10-15 DIAGNOSIS — Z7902 Long term (current) use of antithrombotics/antiplatelets: Secondary | ICD-10-CM | POA: Diagnosis not present

## 2014-10-15 DIAGNOSIS — M159 Polyosteoarthritis, unspecified: Secondary | ICD-10-CM | POA: Diagnosis not present

## 2014-10-15 DIAGNOSIS — E785 Hyperlipidemia, unspecified: Secondary | ICD-10-CM | POA: Diagnosis present

## 2014-10-15 DIAGNOSIS — K2270 Barrett's esophagus without dysplasia: Secondary | ICD-10-CM | POA: Diagnosis present

## 2014-10-15 DIAGNOSIS — R42 Dizziness and giddiness: Secondary | ICD-10-CM | POA: Diagnosis not present

## 2014-10-15 DIAGNOSIS — E669 Obesity, unspecified: Secondary | ICD-10-CM | POA: Diagnosis present

## 2014-10-15 DIAGNOSIS — I10 Essential (primary) hypertension: Secondary | ICD-10-CM | POA: Diagnosis present

## 2014-10-15 DIAGNOSIS — Z9842 Cataract extraction status, left eye: Secondary | ICD-10-CM | POA: Diagnosis not present

## 2014-10-15 DIAGNOSIS — Z87891 Personal history of nicotine dependence: Secondary | ICD-10-CM | POA: Diagnosis not present

## 2014-10-15 DIAGNOSIS — Z8249 Family history of ischemic heart disease and other diseases of the circulatory system: Secondary | ICD-10-CM | POA: Diagnosis not present

## 2014-10-15 DIAGNOSIS — R531 Weakness: Secondary | ICD-10-CM | POA: Diagnosis not present

## 2014-10-15 DIAGNOSIS — Z9849 Cataract extraction status, unspecified eye: Secondary | ICD-10-CM | POA: Diagnosis not present

## 2014-10-15 DIAGNOSIS — F329 Major depressive disorder, single episode, unspecified: Secondary | ICD-10-CM | POA: Diagnosis not present

## 2014-10-15 DIAGNOSIS — Z6832 Body mass index (BMI) 32.0-32.9, adult: Secondary | ICD-10-CM | POA: Diagnosis not present

## 2014-10-15 DIAGNOSIS — M25551 Pain in right hip: Secondary | ICD-10-CM | POA: Diagnosis not present

## 2014-10-15 DIAGNOSIS — N400 Enlarged prostate without lower urinary tract symptoms: Secondary | ICD-10-CM | POA: Diagnosis present

## 2014-10-15 DIAGNOSIS — I4891 Unspecified atrial fibrillation: Secondary | ICD-10-CM | POA: Diagnosis present

## 2014-10-15 DIAGNOSIS — M25159 Fistula, unspecified hip: Secondary | ICD-10-CM | POA: Diagnosis not present

## 2014-10-15 DIAGNOSIS — Z9660 Presence of unspecified orthopedic joint implant: Secondary | ICD-10-CM | POA: Diagnosis not present

## 2014-10-15 DIAGNOSIS — G8918 Other acute postprocedural pain: Secondary | ICD-10-CM | POA: Diagnosis not present

## 2014-10-15 DIAGNOSIS — H832X9 Labyrinthine dysfunction, unspecified ear: Secondary | ICD-10-CM | POA: Diagnosis not present

## 2014-10-15 DIAGNOSIS — Z96641 Presence of right artificial hip joint: Secondary | ICD-10-CM | POA: Diagnosis present

## 2014-10-15 DIAGNOSIS — H9191 Unspecified hearing loss, right ear: Secondary | ICD-10-CM | POA: Diagnosis present

## 2014-10-15 DIAGNOSIS — G629 Polyneuropathy, unspecified: Secondary | ICD-10-CM | POA: Diagnosis present

## 2014-10-18 DIAGNOSIS — S32302D Unspecified fracture of left ilium, subsequent encounter for fracture with routine healing: Secondary | ICD-10-CM | POA: Diagnosis not present

## 2014-10-18 DIAGNOSIS — M25551 Pain in right hip: Secondary | ICD-10-CM | POA: Diagnosis not present

## 2014-10-18 DIAGNOSIS — G629 Polyneuropathy, unspecified: Secondary | ICD-10-CM | POA: Diagnosis not present

## 2014-10-18 DIAGNOSIS — Z87891 Personal history of nicotine dependence: Secondary | ICD-10-CM | POA: Diagnosis not present

## 2014-10-18 DIAGNOSIS — Z9849 Cataract extraction status, unspecified eye: Secondary | ICD-10-CM | POA: Diagnosis not present

## 2014-10-18 DIAGNOSIS — F329 Major depressive disorder, single episode, unspecified: Secondary | ICD-10-CM | POA: Diagnosis not present

## 2014-10-18 DIAGNOSIS — G609 Hereditary and idiopathic neuropathy, unspecified: Secondary | ICD-10-CM | POA: Diagnosis not present

## 2014-10-18 DIAGNOSIS — R7309 Other abnormal glucose: Secondary | ICD-10-CM | POA: Diagnosis not present

## 2014-10-18 DIAGNOSIS — K219 Gastro-esophageal reflux disease without esophagitis: Secondary | ICD-10-CM | POA: Diagnosis not present

## 2014-10-18 DIAGNOSIS — I482 Chronic atrial fibrillation: Secondary | ICD-10-CM | POA: Diagnosis not present

## 2014-10-18 DIAGNOSIS — H832X9 Labyrinthine dysfunction, unspecified ear: Secondary | ICD-10-CM | POA: Diagnosis not present

## 2014-10-18 DIAGNOSIS — N400 Enlarged prostate without lower urinary tract symptoms: Secondary | ICD-10-CM | POA: Diagnosis not present

## 2014-10-18 DIAGNOSIS — E785 Hyperlipidemia, unspecified: Secondary | ICD-10-CM | POA: Diagnosis not present

## 2014-10-18 DIAGNOSIS — M159 Polyosteoarthritis, unspecified: Secondary | ICD-10-CM | POA: Diagnosis not present

## 2014-10-18 DIAGNOSIS — K4090 Unilateral inguinal hernia, without obstruction or gangrene, not specified as recurrent: Secondary | ICD-10-CM | POA: Diagnosis not present

## 2014-10-18 DIAGNOSIS — Z471 Aftercare following joint replacement surgery: Secondary | ICD-10-CM | POA: Diagnosis not present

## 2014-10-18 DIAGNOSIS — I4891 Unspecified atrial fibrillation: Secondary | ICD-10-CM | POA: Diagnosis not present

## 2014-10-18 DIAGNOSIS — I10 Essential (primary) hypertension: Secondary | ICD-10-CM | POA: Diagnosis not present

## 2014-10-18 DIAGNOSIS — M25159 Fistula, unspecified hip: Secondary | ICD-10-CM | POA: Diagnosis not present

## 2014-10-18 DIAGNOSIS — I714 Abdominal aortic aneurysm, without rupture: Secondary | ICD-10-CM | POA: Diagnosis not present

## 2014-10-18 DIAGNOSIS — K2270 Barrett's esophagus without dysplasia: Secondary | ICD-10-CM | POA: Diagnosis not present

## 2014-10-18 DIAGNOSIS — Z7409 Other reduced mobility: Secondary | ICD-10-CM | POA: Diagnosis not present

## 2014-10-18 DIAGNOSIS — M25512 Pain in left shoulder: Secondary | ICD-10-CM | POA: Diagnosis not present

## 2014-10-18 DIAGNOSIS — R42 Dizziness and giddiness: Secondary | ICD-10-CM | POA: Diagnosis not present

## 2014-10-18 DIAGNOSIS — T8489XD Other specified complication of internal orthopedic prosthetic devices, implants and grafts, subsequent encounter: Secondary | ICD-10-CM | POA: Diagnosis not present

## 2014-10-18 DIAGNOSIS — Z6831 Body mass index (BMI) 31.0-31.9, adult: Secondary | ICD-10-CM | POA: Diagnosis not present

## 2014-10-18 DIAGNOSIS — M4800 Spinal stenosis, site unspecified: Secondary | ICD-10-CM | POA: Diagnosis not present

## 2014-10-18 DIAGNOSIS — R531 Weakness: Secondary | ICD-10-CM | POA: Diagnosis not present

## 2014-10-18 DIAGNOSIS — H3530 Unspecified macular degeneration: Secondary | ICD-10-CM | POA: Diagnosis not present

## 2014-10-18 DIAGNOSIS — Z96641 Presence of right artificial hip joint: Secondary | ICD-10-CM | POA: Diagnosis not present

## 2014-10-18 DIAGNOSIS — E669 Obesity, unspecified: Secondary | ICD-10-CM | POA: Diagnosis not present

## 2014-10-18 DIAGNOSIS — E784 Other hyperlipidemia: Secondary | ICD-10-CM | POA: Diagnosis not present

## 2014-10-21 DIAGNOSIS — M25512 Pain in left shoulder: Secondary | ICD-10-CM | POA: Diagnosis not present

## 2014-10-21 DIAGNOSIS — M25551 Pain in right hip: Secondary | ICD-10-CM | POA: Diagnosis not present

## 2014-10-22 DIAGNOSIS — Z471 Aftercare following joint replacement surgery: Secondary | ICD-10-CM | POA: Diagnosis not present

## 2014-10-22 DIAGNOSIS — Z87891 Personal history of nicotine dependence: Secondary | ICD-10-CM | POA: Diagnosis not present

## 2014-10-22 DIAGNOSIS — Z96641 Presence of right artificial hip joint: Secondary | ICD-10-CM | POA: Diagnosis not present

## 2014-10-25 DIAGNOSIS — S32302D Unspecified fracture of left ilium, subsequent encounter for fracture with routine healing: Secondary | ICD-10-CM | POA: Diagnosis not present

## 2014-10-25 DIAGNOSIS — I482 Chronic atrial fibrillation: Secondary | ICD-10-CM | POA: Diagnosis not present

## 2014-10-25 DIAGNOSIS — I10 Essential (primary) hypertension: Secondary | ICD-10-CM | POA: Diagnosis not present

## 2014-10-25 DIAGNOSIS — G609 Hereditary and idiopathic neuropathy, unspecified: Secondary | ICD-10-CM | POA: Diagnosis not present

## 2014-11-01 DIAGNOSIS — I4891 Unspecified atrial fibrillation: Secondary | ICD-10-CM | POA: Diagnosis not present

## 2014-11-01 DIAGNOSIS — Z471 Aftercare following joint replacement surgery: Secondary | ICD-10-CM | POA: Diagnosis not present

## 2014-11-01 DIAGNOSIS — N400 Enlarged prostate without lower urinary tract symptoms: Secondary | ICD-10-CM | POA: Diagnosis not present

## 2014-11-01 DIAGNOSIS — E785 Hyperlipidemia, unspecified: Secondary | ICD-10-CM | POA: Diagnosis not present

## 2014-11-01 DIAGNOSIS — Z96641 Presence of right artificial hip joint: Secondary | ICD-10-CM | POA: Diagnosis not present

## 2014-11-01 DIAGNOSIS — Z87891 Personal history of nicotine dependence: Secondary | ICD-10-CM | POA: Diagnosis not present

## 2014-11-06 DIAGNOSIS — Z7982 Long term (current) use of aspirin: Secondary | ICD-10-CM | POA: Diagnosis not present

## 2014-11-06 DIAGNOSIS — R2689 Other abnormalities of gait and mobility: Secondary | ICD-10-CM | POA: Diagnosis not present

## 2014-11-06 DIAGNOSIS — I714 Abdominal aortic aneurysm, without rupture: Secondary | ICD-10-CM | POA: Diagnosis not present

## 2014-11-06 DIAGNOSIS — M6281 Muscle weakness (generalized): Secondary | ICD-10-CM | POA: Diagnosis not present

## 2014-11-06 DIAGNOSIS — Z471 Aftercare following joint replacement surgery: Secondary | ICD-10-CM | POA: Diagnosis not present

## 2014-11-06 DIAGNOSIS — Z01812 Encounter for preprocedural laboratory examination: Secondary | ICD-10-CM | POA: Diagnosis not present

## 2014-11-08 DIAGNOSIS — Z471 Aftercare following joint replacement surgery: Secondary | ICD-10-CM | POA: Diagnosis not present

## 2014-11-08 DIAGNOSIS — R2689 Other abnormalities of gait and mobility: Secondary | ICD-10-CM | POA: Diagnosis not present

## 2014-11-08 DIAGNOSIS — M6281 Muscle weakness (generalized): Secondary | ICD-10-CM | POA: Diagnosis not present

## 2014-11-11 DIAGNOSIS — R2689 Other abnormalities of gait and mobility: Secondary | ICD-10-CM | POA: Diagnosis not present

## 2014-11-11 DIAGNOSIS — Z471 Aftercare following joint replacement surgery: Secondary | ICD-10-CM | POA: Diagnosis not present

## 2014-11-11 DIAGNOSIS — M6281 Muscle weakness (generalized): Secondary | ICD-10-CM | POA: Diagnosis not present

## 2014-11-12 DIAGNOSIS — B349 Viral infection, unspecified: Secondary | ICD-10-CM | POA: Diagnosis present

## 2014-11-12 DIAGNOSIS — N400 Enlarged prostate without lower urinary tract symptoms: Secondary | ICD-10-CM | POA: Diagnosis present

## 2014-11-12 DIAGNOSIS — R6510 Systemic inflammatory response syndrome (SIRS) of non-infectious origin without acute organ dysfunction: Secondary | ICD-10-CM | POA: Diagnosis not present

## 2014-11-12 DIAGNOSIS — Z87891 Personal history of nicotine dependence: Secondary | ICD-10-CM | POA: Diagnosis not present

## 2014-11-12 DIAGNOSIS — I6529 Occlusion and stenosis of unspecified carotid artery: Secondary | ICD-10-CM | POA: Diagnosis present

## 2014-11-12 DIAGNOSIS — R42 Dizziness and giddiness: Secondary | ICD-10-CM | POA: Diagnosis present

## 2014-11-12 DIAGNOSIS — I10 Essential (primary) hypertension: Secondary | ICD-10-CM | POA: Diagnosis present

## 2014-11-12 DIAGNOSIS — D649 Anemia, unspecified: Secondary | ICD-10-CM | POA: Diagnosis present

## 2014-11-12 DIAGNOSIS — I4891 Unspecified atrial fibrillation: Secondary | ICD-10-CM | POA: Diagnosis not present

## 2014-11-12 DIAGNOSIS — H9191 Unspecified hearing loss, right ear: Secondary | ICD-10-CM | POA: Diagnosis present

## 2014-11-12 DIAGNOSIS — E861 Hypovolemia: Secondary | ICD-10-CM | POA: Diagnosis present

## 2014-11-12 DIAGNOSIS — N179 Acute kidney failure, unspecified: Secondary | ICD-10-CM | POA: Diagnosis present

## 2014-11-12 DIAGNOSIS — R6511 Systemic inflammatory response syndrome (SIRS) of non-infectious origin with acute organ dysfunction: Secondary | ICD-10-CM | POA: Diagnosis not present

## 2014-11-12 DIAGNOSIS — R079 Chest pain, unspecified: Secondary | ICD-10-CM | POA: Diagnosis not present

## 2014-11-12 DIAGNOSIS — G629 Polyneuropathy, unspecified: Secondary | ICD-10-CM | POA: Diagnosis not present

## 2014-11-12 DIAGNOSIS — R509 Fever, unspecified: Secondary | ICD-10-CM | POA: Diagnosis not present

## 2014-11-12 DIAGNOSIS — K219 Gastro-esophageal reflux disease without esophagitis: Secondary | ICD-10-CM | POA: Diagnosis present

## 2014-11-12 DIAGNOSIS — A5216 Charcot's arthropathy (tabetic): Secondary | ICD-10-CM | POA: Diagnosis not present

## 2014-11-12 DIAGNOSIS — I959 Hypotension, unspecified: Secondary | ICD-10-CM | POA: Diagnosis not present

## 2014-11-12 DIAGNOSIS — K2270 Barrett's esophagus without dysplasia: Secondary | ICD-10-CM | POA: Diagnosis present

## 2014-11-12 DIAGNOSIS — E669 Obesity, unspecified: Secondary | ICD-10-CM | POA: Diagnosis present

## 2014-11-12 DIAGNOSIS — A419 Sepsis, unspecified organism: Secondary | ICD-10-CM | POA: Diagnosis not present

## 2014-11-12 DIAGNOSIS — E8770 Fluid overload, unspecified: Secondary | ICD-10-CM | POA: Diagnosis not present

## 2014-11-12 DIAGNOSIS — Z96641 Presence of right artificial hip joint: Secondary | ICD-10-CM | POA: Diagnosis not present

## 2014-11-12 DIAGNOSIS — Z6831 Body mass index (BMI) 31.0-31.9, adult: Secondary | ICD-10-CM | POA: Diagnosis not present

## 2014-11-12 DIAGNOSIS — E785 Hyperlipidemia, unspecified: Secondary | ICD-10-CM | POA: Diagnosis present

## 2014-11-12 DIAGNOSIS — Z7982 Long term (current) use of aspirin: Secondary | ICD-10-CM | POA: Diagnosis not present

## 2014-11-12 DIAGNOSIS — R110 Nausea: Secondary | ICD-10-CM | POA: Diagnosis not present

## 2014-11-12 DIAGNOSIS — I2699 Other pulmonary embolism without acute cor pulmonale: Secondary | ICD-10-CM | POA: Diagnosis not present

## 2014-11-12 DIAGNOSIS — Z8679 Personal history of other diseases of the circulatory system: Secondary | ICD-10-CM | POA: Diagnosis not present

## 2014-11-12 DIAGNOSIS — Z9889 Other specified postprocedural states: Secondary | ICD-10-CM | POA: Diagnosis not present

## 2014-11-12 DIAGNOSIS — M21379 Foot drop, unspecified foot: Secondary | ICD-10-CM | POA: Diagnosis present

## 2014-11-12 DIAGNOSIS — I482 Chronic atrial fibrillation: Secondary | ICD-10-CM | POA: Diagnosis present

## 2014-11-15 DIAGNOSIS — T847XXD Infection and inflammatory reaction due to other internal orthopedic prosthetic devices, implants and grafts, subsequent encounter: Secondary | ICD-10-CM | POA: Diagnosis not present

## 2014-11-15 DIAGNOSIS — Z7901 Long term (current) use of anticoagulants: Secondary | ICD-10-CM | POA: Diagnosis not present

## 2014-11-15 DIAGNOSIS — T8130XA Disruption of wound, unspecified, initial encounter: Secondary | ICD-10-CM | POA: Diagnosis not present

## 2014-11-15 DIAGNOSIS — M1460 Charcot's joint, unspecified site: Secondary | ICD-10-CM | POA: Diagnosis present

## 2014-11-15 DIAGNOSIS — Z8249 Family history of ischemic heart disease and other diseases of the circulatory system: Secondary | ICD-10-CM | POA: Diagnosis not present

## 2014-11-15 DIAGNOSIS — L7602 Intraoperative hemorrhage and hematoma of skin and subcutaneous tissue complicating other procedure: Secondary | ICD-10-CM | POA: Diagnosis present

## 2014-11-15 DIAGNOSIS — Z7982 Long term (current) use of aspirin: Secondary | ICD-10-CM | POA: Diagnosis not present

## 2014-11-15 DIAGNOSIS — K219 Gastro-esophageal reflux disease without esophagitis: Secondary | ICD-10-CM | POA: Diagnosis present

## 2014-11-15 DIAGNOSIS — M1990 Unspecified osteoarthritis, unspecified site: Secondary | ICD-10-CM | POA: Diagnosis present

## 2014-11-15 DIAGNOSIS — K2270 Barrett's esophagus without dysplasia: Secondary | ICD-10-CM | POA: Diagnosis present

## 2014-11-15 DIAGNOSIS — H9191 Unspecified hearing loss, right ear: Secondary | ICD-10-CM | POA: Diagnosis present

## 2014-11-15 DIAGNOSIS — I10 Essential (primary) hypertension: Secondary | ICD-10-CM | POA: Diagnosis present

## 2014-11-15 DIAGNOSIS — Z471 Aftercare following joint replacement surgery: Secondary | ICD-10-CM | POA: Diagnosis not present

## 2014-11-15 DIAGNOSIS — D481 Neoplasm of uncertain behavior of connective and other soft tissue: Secondary | ICD-10-CM | POA: Diagnosis not present

## 2014-11-15 DIAGNOSIS — B957 Other staphylococcus as the cause of diseases classified elsewhere: Secondary | ICD-10-CM | POA: Diagnosis not present

## 2014-11-15 DIAGNOSIS — S7001XA Contusion of right hip, initial encounter: Secondary | ICD-10-CM | POA: Diagnosis present

## 2014-11-15 DIAGNOSIS — Z87891 Personal history of nicotine dependence: Secondary | ICD-10-CM | POA: Diagnosis not present

## 2014-11-15 DIAGNOSIS — Z96659 Presence of unspecified artificial knee joint: Secondary | ICD-10-CM | POA: Diagnosis present

## 2014-11-15 DIAGNOSIS — I714 Abdominal aortic aneurysm, without rupture: Secondary | ICD-10-CM | POA: Diagnosis not present

## 2014-11-15 DIAGNOSIS — T84090A Other mechanical complication of internal right hip prosthesis, initial encounter: Secondary | ICD-10-CM | POA: Diagnosis not present

## 2014-11-15 DIAGNOSIS — Z8679 Personal history of other diseases of the circulatory system: Secondary | ICD-10-CM | POA: Diagnosis not present

## 2014-11-15 DIAGNOSIS — T8132XA Disruption of internal operation (surgical) wound, not elsewhere classified, initial encounter: Secondary | ICD-10-CM | POA: Diagnosis present

## 2014-11-15 DIAGNOSIS — H3530 Unspecified macular degeneration: Secondary | ICD-10-CM | POA: Diagnosis present

## 2014-11-15 DIAGNOSIS — N400 Enlarged prostate without lower urinary tract symptoms: Secondary | ICD-10-CM | POA: Diagnosis present

## 2014-11-15 DIAGNOSIS — I272 Other secondary pulmonary hypertension: Secondary | ICD-10-CM | POA: Diagnosis present

## 2014-11-15 DIAGNOSIS — T8131XA Disruption of external operation (surgical) wound, not elsewhere classified, initial encounter: Secondary | ICD-10-CM | POA: Diagnosis not present

## 2014-11-15 DIAGNOSIS — Z96641 Presence of right artificial hip joint: Secondary | ICD-10-CM | POA: Diagnosis present

## 2014-11-15 DIAGNOSIS — I482 Chronic atrial fibrillation: Secondary | ICD-10-CM | POA: Diagnosis present

## 2014-11-15 DIAGNOSIS — E785 Hyperlipidemia, unspecified: Secondary | ICD-10-CM | POA: Diagnosis present

## 2014-11-20 DIAGNOSIS — E669 Obesity, unspecified: Secondary | ICD-10-CM | POA: Diagnosis not present

## 2014-11-20 DIAGNOSIS — Z96641 Presence of right artificial hip joint: Secondary | ICD-10-CM | POA: Diagnosis not present

## 2014-11-20 DIAGNOSIS — M96830 Postprocedural hemorrhage and hematoma of a musculoskeletal structure following a musculoskeletal system procedure: Secondary | ICD-10-CM | POA: Diagnosis not present

## 2014-11-20 DIAGNOSIS — I10 Essential (primary) hypertension: Secondary | ICD-10-CM | POA: Diagnosis not present

## 2014-11-20 DIAGNOSIS — Z7901 Long term (current) use of anticoagulants: Secondary | ICD-10-CM | POA: Diagnosis not present

## 2014-11-20 DIAGNOSIS — G8918 Other acute postprocedural pain: Secondary | ICD-10-CM | POA: Diagnosis not present

## 2014-11-20 DIAGNOSIS — T148 Other injury of unspecified body region: Secondary | ICD-10-CM | POA: Diagnosis not present

## 2014-11-20 DIAGNOSIS — Z6831 Body mass index (BMI) 31.0-31.9, adult: Secondary | ICD-10-CM | POA: Diagnosis not present

## 2014-11-20 DIAGNOSIS — Z87891 Personal history of nicotine dependence: Secondary | ICD-10-CM | POA: Diagnosis not present

## 2014-11-20 DIAGNOSIS — M25551 Pain in right hip: Secondary | ICD-10-CM | POA: Diagnosis not present

## 2014-11-20 DIAGNOSIS — Z471 Aftercare following joint replacement surgery: Secondary | ICD-10-CM | POA: Diagnosis not present

## 2014-11-21 DIAGNOSIS — Z471 Aftercare following joint replacement surgery: Secondary | ICD-10-CM | POA: Diagnosis not present

## 2014-11-21 DIAGNOSIS — Z96641 Presence of right artificial hip joint: Secondary | ICD-10-CM | POA: Diagnosis not present

## 2014-11-27 DIAGNOSIS — Z471 Aftercare following joint replacement surgery: Secondary | ICD-10-CM | POA: Diagnosis not present

## 2014-11-27 DIAGNOSIS — Z96641 Presence of right artificial hip joint: Secondary | ICD-10-CM | POA: Diagnosis not present

## 2014-11-29 DIAGNOSIS — T8451XA Infection and inflammatory reaction due to internal right hip prosthesis, initial encounter: Secondary | ICD-10-CM | POA: Diagnosis not present

## 2014-11-29 DIAGNOSIS — Z7901 Long term (current) use of anticoagulants: Secondary | ICD-10-CM | POA: Diagnosis not present

## 2014-11-29 DIAGNOSIS — G8918 Other acute postprocedural pain: Secondary | ICD-10-CM | POA: Diagnosis not present

## 2014-11-29 DIAGNOSIS — N400 Enlarged prostate without lower urinary tract symptoms: Secondary | ICD-10-CM | POA: Diagnosis present

## 2014-11-29 DIAGNOSIS — F329 Major depressive disorder, single episode, unspecified: Secondary | ICD-10-CM | POA: Diagnosis present

## 2014-11-29 DIAGNOSIS — M25551 Pain in right hip: Secondary | ICD-10-CM | POA: Diagnosis not present

## 2014-11-29 DIAGNOSIS — E875 Hyperkalemia: Secondary | ICD-10-CM | POA: Diagnosis present

## 2014-11-29 DIAGNOSIS — G629 Polyneuropathy, unspecified: Secondary | ICD-10-CM | POA: Diagnosis present

## 2014-11-29 DIAGNOSIS — N179 Acute kidney failure, unspecified: Secondary | ICD-10-CM | POA: Diagnosis present

## 2014-11-29 DIAGNOSIS — Z87891 Personal history of nicotine dependence: Secondary | ICD-10-CM | POA: Diagnosis not present

## 2014-11-29 DIAGNOSIS — Z471 Aftercare following joint replacement surgery: Secondary | ICD-10-CM | POA: Diagnosis not present

## 2014-11-29 DIAGNOSIS — I482 Chronic atrial fibrillation: Secondary | ICD-10-CM | POA: Diagnosis present

## 2014-11-29 DIAGNOSIS — Z96649 Presence of unspecified artificial hip joint: Secondary | ICD-10-CM | POA: Diagnosis not present

## 2014-11-29 DIAGNOSIS — F419 Anxiety disorder, unspecified: Secondary | ICD-10-CM | POA: Diagnosis present

## 2014-11-29 DIAGNOSIS — Z96653 Presence of artificial knee joint, bilateral: Secondary | ICD-10-CM | POA: Diagnosis present

## 2014-11-29 DIAGNOSIS — T8131XA Disruption of external operation (surgical) wound, not elsewhere classified, initial encounter: Secondary | ICD-10-CM | POA: Diagnosis present

## 2014-11-29 DIAGNOSIS — T8130XD Disruption of wound, unspecified, subsequent encounter: Secondary | ICD-10-CM | POA: Diagnosis not present

## 2014-11-29 DIAGNOSIS — Z8249 Family history of ischemic heart disease and other diseases of the circulatory system: Secondary | ICD-10-CM | POA: Diagnosis not present

## 2014-11-29 DIAGNOSIS — K219 Gastro-esophageal reflux disease without esophagitis: Secondary | ICD-10-CM | POA: Diagnosis present

## 2014-11-29 DIAGNOSIS — E871 Hypo-osmolality and hyponatremia: Secondary | ICD-10-CM | POA: Diagnosis not present

## 2014-11-29 DIAGNOSIS — Z96641 Presence of right artificial hip joint: Secondary | ICD-10-CM | POA: Diagnosis present

## 2014-11-29 DIAGNOSIS — E785 Hyperlipidemia, unspecified: Secondary | ICD-10-CM | POA: Diagnosis present

## 2014-11-29 DIAGNOSIS — I714 Abdominal aortic aneurysm, without rupture: Secondary | ICD-10-CM | POA: Diagnosis present

## 2014-11-29 DIAGNOSIS — I10 Essential (primary) hypertension: Secondary | ICD-10-CM | POA: Diagnosis present

## 2014-12-04 DIAGNOSIS — Z4801 Encounter for change or removal of surgical wound dressing: Secondary | ICD-10-CM | POA: Diagnosis not present

## 2014-12-04 DIAGNOSIS — Z96641 Presence of right artificial hip joint: Secondary | ICD-10-CM | POA: Diagnosis not present

## 2014-12-04 DIAGNOSIS — T8131XD Disruption of external operation (surgical) wound, not elsewhere classified, subsequent encounter: Secondary | ICD-10-CM | POA: Diagnosis not present

## 2014-12-09 DIAGNOSIS — Z7901 Long term (current) use of anticoagulants: Secondary | ICD-10-CM | POA: Diagnosis not present

## 2014-12-09 DIAGNOSIS — Z96641 Presence of right artificial hip joint: Secondary | ICD-10-CM | POA: Diagnosis not present

## 2014-12-09 DIAGNOSIS — Z5181 Encounter for therapeutic drug level monitoring: Secondary | ICD-10-CM | POA: Diagnosis not present

## 2014-12-11 DIAGNOSIS — Z888 Allergy status to other drugs, medicaments and biological substances status: Secondary | ICD-10-CM | POA: Diagnosis not present

## 2014-12-11 DIAGNOSIS — Z96641 Presence of right artificial hip joint: Secondary | ICD-10-CM | POA: Diagnosis not present

## 2014-12-11 DIAGNOSIS — Z471 Aftercare following joint replacement surgery: Secondary | ICD-10-CM | POA: Diagnosis not present

## 2014-12-11 DIAGNOSIS — Z885 Allergy status to narcotic agent status: Secondary | ICD-10-CM | POA: Diagnosis not present

## 2014-12-11 DIAGNOSIS — Z87891 Personal history of nicotine dependence: Secondary | ICD-10-CM | POA: Diagnosis not present

## 2014-12-11 DIAGNOSIS — I10 Essential (primary) hypertension: Secondary | ICD-10-CM | POA: Diagnosis not present

## 2014-12-16 DIAGNOSIS — T148 Other injury of unspecified body region: Secondary | ICD-10-CM | POA: Diagnosis not present

## 2014-12-16 DIAGNOSIS — Z471 Aftercare following joint replacement surgery: Secondary | ICD-10-CM | POA: Diagnosis not present

## 2014-12-16 DIAGNOSIS — Z96641 Presence of right artificial hip joint: Secondary | ICD-10-CM | POA: Diagnosis not present

## 2014-12-18 DIAGNOSIS — Z96641 Presence of right artificial hip joint: Secondary | ICD-10-CM | POA: Diagnosis not present

## 2014-12-18 DIAGNOSIS — Z471 Aftercare following joint replacement surgery: Secondary | ICD-10-CM | POA: Diagnosis not present

## 2014-12-18 DIAGNOSIS — Z87891 Personal history of nicotine dependence: Secondary | ICD-10-CM | POA: Diagnosis not present

## 2014-12-18 DIAGNOSIS — Z4802 Encounter for removal of sutures: Secondary | ICD-10-CM | POA: Diagnosis not present

## 2014-12-25 DIAGNOSIS — M96830 Postprocedural hemorrhage and hematoma of a musculoskeletal structure following a musculoskeletal system procedure: Secondary | ICD-10-CM | POA: Diagnosis not present

## 2014-12-25 DIAGNOSIS — Z471 Aftercare following joint replacement surgery: Secondary | ICD-10-CM | POA: Diagnosis not present

## 2014-12-25 DIAGNOSIS — Z7982 Long term (current) use of aspirin: Secondary | ICD-10-CM | POA: Diagnosis not present

## 2014-12-25 DIAGNOSIS — T8189XA Other complications of procedures, not elsewhere classified, initial encounter: Secondary | ICD-10-CM | POA: Diagnosis not present

## 2014-12-25 DIAGNOSIS — Z96641 Presence of right artificial hip joint: Secondary | ICD-10-CM | POA: Diagnosis not present

## 2015-01-01 DIAGNOSIS — Z471 Aftercare following joint replacement surgery: Secondary | ICD-10-CM | POA: Diagnosis not present

## 2015-01-01 DIAGNOSIS — M96830 Postprocedural hemorrhage and hematoma of a musculoskeletal structure following a musculoskeletal system procedure: Secondary | ICD-10-CM | POA: Diagnosis not present

## 2015-01-01 DIAGNOSIS — Z96641 Presence of right artificial hip joint: Secondary | ICD-10-CM | POA: Diagnosis not present

## 2015-01-08 DIAGNOSIS — Z96641 Presence of right artificial hip joint: Secondary | ICD-10-CM | POA: Diagnosis not present

## 2015-01-08 DIAGNOSIS — M96830 Postprocedural hemorrhage and hematoma of a musculoskeletal structure following a musculoskeletal system procedure: Secondary | ICD-10-CM | POA: Diagnosis not present

## 2015-01-08 DIAGNOSIS — Z7982 Long term (current) use of aspirin: Secondary | ICD-10-CM | POA: Diagnosis not present

## 2015-01-08 DIAGNOSIS — Z471 Aftercare following joint replacement surgery: Secondary | ICD-10-CM | POA: Diagnosis not present

## 2015-01-15 DIAGNOSIS — Z96641 Presence of right artificial hip joint: Secondary | ICD-10-CM | POA: Diagnosis not present

## 2015-01-15 DIAGNOSIS — T8451XD Infection and inflammatory reaction due to internal right hip prosthesis, subsequent encounter: Secondary | ICD-10-CM | POA: Diagnosis not present

## 2015-01-15 DIAGNOSIS — Z471 Aftercare following joint replacement surgery: Secondary | ICD-10-CM | POA: Diagnosis not present

## 2015-01-16 DIAGNOSIS — M96830 Postprocedural hemorrhage and hematoma of a musculoskeletal structure following a musculoskeletal system procedure: Secondary | ICD-10-CM | POA: Diagnosis not present

## 2015-01-17 DIAGNOSIS — M25551 Pain in right hip: Secondary | ICD-10-CM | POA: Diagnosis not present

## 2015-01-21 DIAGNOSIS — T8451XD Infection and inflammatory reaction due to internal right hip prosthesis, subsequent encounter: Secondary | ICD-10-CM | POA: Diagnosis not present

## 2015-01-22 DIAGNOSIS — T8451XD Infection and inflammatory reaction due to internal right hip prosthesis, subsequent encounter: Secondary | ICD-10-CM | POA: Diagnosis not present

## 2015-01-23 DIAGNOSIS — Z01812 Encounter for preprocedural laboratory examination: Secondary | ICD-10-CM | POA: Diagnosis not present

## 2015-01-24 DIAGNOSIS — M79675 Pain in left toe(s): Secondary | ICD-10-CM | POA: Diagnosis not present

## 2015-01-24 DIAGNOSIS — B351 Tinea unguium: Secondary | ICD-10-CM | POA: Diagnosis not present

## 2015-01-24 DIAGNOSIS — M79674 Pain in right toe(s): Secondary | ICD-10-CM | POA: Diagnosis not present

## 2015-01-27 DIAGNOSIS — L089 Local infection of the skin and subcutaneous tissue, unspecified: Secondary | ICD-10-CM | POA: Diagnosis not present

## 2015-01-27 DIAGNOSIS — T8459XD Infection and inflammatory reaction due to other internal joint prosthesis, subsequent encounter: Secondary | ICD-10-CM | POA: Diagnosis not present

## 2015-01-27 DIAGNOSIS — B965 Pseudomonas (aeruginosa) (mallei) (pseudomallei) as the cause of diseases classified elsewhere: Secondary | ICD-10-CM | POA: Diagnosis present

## 2015-01-27 DIAGNOSIS — Z87891 Personal history of nicotine dependence: Secondary | ICD-10-CM | POA: Diagnosis not present

## 2015-01-27 DIAGNOSIS — T8131XA Disruption of external operation (surgical) wound, not elsewhere classified, initial encounter: Secondary | ICD-10-CM | POA: Diagnosis not present

## 2015-01-27 DIAGNOSIS — T8450XD Infection and inflammatory reaction due to unspecified internal joint prosthesis, subsequent encounter: Secondary | ICD-10-CM | POA: Diagnosis not present

## 2015-01-27 DIAGNOSIS — E785 Hyperlipidemia, unspecified: Secondary | ICD-10-CM | POA: Diagnosis present

## 2015-01-27 DIAGNOSIS — T8130XA Disruption of wound, unspecified, initial encounter: Secondary | ICD-10-CM | POA: Diagnosis present

## 2015-01-27 DIAGNOSIS — T814XXA Infection following a procedure, initial encounter: Secondary | ICD-10-CM | POA: Diagnosis not present

## 2015-01-27 DIAGNOSIS — M009 Pyogenic arthritis, unspecified: Secondary | ICD-10-CM | POA: Diagnosis not present

## 2015-01-27 DIAGNOSIS — T8450XA Infection and inflammatory reaction due to unspecified internal joint prosthesis, initial encounter: Secondary | ICD-10-CM | POA: Diagnosis not present

## 2015-01-27 DIAGNOSIS — D62 Acute posthemorrhagic anemia: Secondary | ICD-10-CM | POA: Diagnosis not present

## 2015-01-27 DIAGNOSIS — Z96649 Presence of unspecified artificial hip joint: Secondary | ICD-10-CM | POA: Diagnosis not present

## 2015-01-27 DIAGNOSIS — Z9841 Cataract extraction status, right eye: Secondary | ICD-10-CM | POA: Diagnosis not present

## 2015-01-27 DIAGNOSIS — Z981 Arthrodesis status: Secondary | ICD-10-CM | POA: Diagnosis not present

## 2015-01-27 DIAGNOSIS — G629 Polyneuropathy, unspecified: Secondary | ICD-10-CM | POA: Diagnosis present

## 2015-01-27 DIAGNOSIS — K219 Gastro-esophageal reflux disease without esophagitis: Secondary | ICD-10-CM | POA: Diagnosis present

## 2015-01-27 DIAGNOSIS — M4806 Spinal stenosis, lumbar region: Secondary | ICD-10-CM | POA: Diagnosis not present

## 2015-01-27 DIAGNOSIS — F329 Major depressive disorder, single episode, unspecified: Secondary | ICD-10-CM | POA: Diagnosis not present

## 2015-01-27 DIAGNOSIS — Z96642 Presence of left artificial hip joint: Secondary | ICD-10-CM | POA: Diagnosis not present

## 2015-01-27 DIAGNOSIS — G8918 Other acute postprocedural pain: Secondary | ICD-10-CM | POA: Diagnosis not present

## 2015-01-27 DIAGNOSIS — Z6831 Body mass index (BMI) 31.0-31.9, adult: Secondary | ICD-10-CM | POA: Diagnosis not present

## 2015-01-27 DIAGNOSIS — E669 Obesity, unspecified: Secondary | ICD-10-CM | POA: Diagnosis present

## 2015-01-27 DIAGNOSIS — T8451XA Infection and inflammatory reaction due to internal right hip prosthesis, initial encounter: Secondary | ICD-10-CM | POA: Diagnosis present

## 2015-01-27 DIAGNOSIS — I482 Chronic atrial fibrillation: Secondary | ICD-10-CM | POA: Diagnosis present

## 2015-01-27 DIAGNOSIS — Z7982 Long term (current) use of aspirin: Secondary | ICD-10-CM | POA: Diagnosis not present

## 2015-01-27 DIAGNOSIS — F419 Anxiety disorder, unspecified: Secondary | ICD-10-CM | POA: Diagnosis not present

## 2015-01-27 DIAGNOSIS — K2270 Barrett's esophagus without dysplasia: Secondary | ICD-10-CM | POA: Diagnosis present

## 2015-01-27 DIAGNOSIS — M14672 Charcot's joint, left ankle and foot: Secondary | ICD-10-CM | POA: Diagnosis present

## 2015-01-27 DIAGNOSIS — N400 Enlarged prostate without lower urinary tract symptoms: Secondary | ICD-10-CM | POA: Diagnosis present

## 2015-01-27 DIAGNOSIS — H3530 Unspecified macular degeneration: Secondary | ICD-10-CM | POA: Diagnosis present

## 2015-01-27 DIAGNOSIS — S7001XA Contusion of right hip, initial encounter: Secondary | ICD-10-CM | POA: Diagnosis not present

## 2015-01-27 DIAGNOSIS — Z7902 Long term (current) use of antithrombotics/antiplatelets: Secondary | ICD-10-CM | POA: Diagnosis not present

## 2015-01-27 DIAGNOSIS — I714 Abdominal aortic aneurysm, without rupture: Secondary | ICD-10-CM | POA: Diagnosis present

## 2015-01-27 DIAGNOSIS — M13812 Other specified arthritis, left shoulder: Secondary | ICD-10-CM | POA: Diagnosis not present

## 2015-01-27 DIAGNOSIS — Z96653 Presence of artificial knee joint, bilateral: Secondary | ICD-10-CM | POA: Diagnosis present

## 2015-01-27 DIAGNOSIS — Z8249 Family history of ischemic heart disease and other diseases of the circulatory system: Secondary | ICD-10-CM | POA: Diagnosis not present

## 2015-01-27 DIAGNOSIS — Z9842 Cataract extraction status, left eye: Secondary | ICD-10-CM | POA: Diagnosis not present

## 2015-01-27 DIAGNOSIS — I10 Essential (primary) hypertension: Secondary | ICD-10-CM | POA: Diagnosis present

## 2015-01-27 DIAGNOSIS — I272 Other secondary pulmonary hypertension: Secondary | ICD-10-CM | POA: Diagnosis present

## 2015-01-27 DIAGNOSIS — H9191 Unspecified hearing loss, right ear: Secondary | ICD-10-CM | POA: Diagnosis not present

## 2015-01-27 DIAGNOSIS — Z96641 Presence of right artificial hip joint: Secondary | ICD-10-CM | POA: Diagnosis not present

## 2015-01-30 DIAGNOSIS — G8918 Other acute postprocedural pain: Secondary | ICD-10-CM | POA: Diagnosis not present

## 2015-02-01 DIAGNOSIS — E785 Hyperlipidemia, unspecified: Secondary | ICD-10-CM | POA: Diagnosis not present

## 2015-02-01 DIAGNOSIS — H3530 Unspecified macular degeneration: Secondary | ICD-10-CM | POA: Diagnosis not present

## 2015-02-01 DIAGNOSIS — F419 Anxiety disorder, unspecified: Secondary | ICD-10-CM | POA: Diagnosis not present

## 2015-02-01 DIAGNOSIS — L089 Local infection of the skin and subcutaneous tissue, unspecified: Secondary | ICD-10-CM | POA: Diagnosis not present

## 2015-02-01 DIAGNOSIS — T8189XA Other complications of procedures, not elsewhere classified, initial encounter: Secondary | ICD-10-CM | POA: Diagnosis not present

## 2015-02-01 DIAGNOSIS — M4806 Spinal stenosis, lumbar region: Secondary | ICD-10-CM | POA: Diagnosis not present

## 2015-02-01 DIAGNOSIS — F329 Major depressive disorder, single episode, unspecified: Secondary | ICD-10-CM | POA: Diagnosis not present

## 2015-02-02 DIAGNOSIS — T8189XA Other complications of procedures, not elsewhere classified, initial encounter: Secondary | ICD-10-CM | POA: Diagnosis not present

## 2015-02-04 DIAGNOSIS — Z96649 Presence of unspecified artificial hip joint: Secondary | ICD-10-CM | POA: Diagnosis not present

## 2015-02-04 DIAGNOSIS — T8450XD Infection and inflammatory reaction due to unspecified internal joint prosthesis, subsequent encounter: Secondary | ICD-10-CM | POA: Diagnosis not present

## 2015-02-11 DIAGNOSIS — T148 Other injury of unspecified body region: Secondary | ICD-10-CM | POA: Diagnosis not present

## 2015-02-11 DIAGNOSIS — E785 Hyperlipidemia, unspecified: Secondary | ICD-10-CM | POA: Diagnosis not present

## 2015-02-11 DIAGNOSIS — L089 Local infection of the skin and subcutaneous tissue, unspecified: Secondary | ICD-10-CM | POA: Diagnosis not present

## 2015-02-11 DIAGNOSIS — I4891 Unspecified atrial fibrillation: Secondary | ICD-10-CM | POA: Diagnosis not present

## 2015-02-17 DIAGNOSIS — S7001XA Contusion of right hip, initial encounter: Secondary | ICD-10-CM | POA: Diagnosis not present

## 2015-02-17 DIAGNOSIS — Z7982 Long term (current) use of aspirin: Secondary | ICD-10-CM | POA: Diagnosis not present

## 2015-02-17 DIAGNOSIS — M8580 Other specified disorders of bone density and structure, unspecified site: Secondary | ICD-10-CM | POA: Diagnosis not present

## 2015-02-17 DIAGNOSIS — L089 Local infection of the skin and subcutaneous tissue, unspecified: Secondary | ICD-10-CM | POA: Diagnosis not present

## 2015-02-17 DIAGNOSIS — Z7901 Long term (current) use of anticoagulants: Secondary | ICD-10-CM | POA: Diagnosis not present

## 2015-02-17 DIAGNOSIS — Z87891 Personal history of nicotine dependence: Secondary | ICD-10-CM | POA: Diagnosis not present

## 2015-02-17 DIAGNOSIS — B965 Pseudomonas (aeruginosa) (mallei) (pseudomallei) as the cause of diseases classified elsewhere: Secondary | ICD-10-CM | POA: Diagnosis not present

## 2015-02-18 DIAGNOSIS — Z4802 Encounter for removal of sutures: Secondary | ICD-10-CM | POA: Diagnosis not present

## 2015-02-18 DIAGNOSIS — Z4789 Encounter for other orthopedic aftercare: Secondary | ICD-10-CM | POA: Diagnosis not present

## 2015-02-19 DIAGNOSIS — H903 Sensorineural hearing loss, bilateral: Secondary | ICD-10-CM | POA: Diagnosis not present

## 2015-03-03 DIAGNOSIS — Z471 Aftercare following joint replacement surgery: Secondary | ICD-10-CM | POA: Diagnosis not present

## 2015-03-03 DIAGNOSIS — M6281 Muscle weakness (generalized): Secondary | ICD-10-CM | POA: Diagnosis not present

## 2015-03-03 DIAGNOSIS — R2689 Other abnormalities of gait and mobility: Secondary | ICD-10-CM | POA: Diagnosis not present

## 2015-03-05 DIAGNOSIS — R2689 Other abnormalities of gait and mobility: Secondary | ICD-10-CM | POA: Diagnosis not present

## 2015-03-05 DIAGNOSIS — Z471 Aftercare following joint replacement surgery: Secondary | ICD-10-CM | POA: Diagnosis not present

## 2015-03-05 DIAGNOSIS — M6281 Muscle weakness (generalized): Secondary | ICD-10-CM | POA: Diagnosis not present

## 2015-03-07 DIAGNOSIS — R2689 Other abnormalities of gait and mobility: Secondary | ICD-10-CM | POA: Diagnosis not present

## 2015-03-07 DIAGNOSIS — M6281 Muscle weakness (generalized): Secondary | ICD-10-CM | POA: Diagnosis not present

## 2015-03-07 DIAGNOSIS — Z471 Aftercare following joint replacement surgery: Secondary | ICD-10-CM | POA: Diagnosis not present

## 2015-03-10 DIAGNOSIS — M6281 Muscle weakness (generalized): Secondary | ICD-10-CM | POA: Diagnosis not present

## 2015-03-10 DIAGNOSIS — Z471 Aftercare following joint replacement surgery: Secondary | ICD-10-CM | POA: Diagnosis not present

## 2015-03-10 DIAGNOSIS — R2689 Other abnormalities of gait and mobility: Secondary | ICD-10-CM | POA: Diagnosis not present

## 2015-03-11 DIAGNOSIS — I10 Essential (primary) hypertension: Secondary | ICD-10-CM | POA: Diagnosis not present

## 2015-03-11 DIAGNOSIS — T8459XD Infection and inflammatory reaction due to other internal joint prosthesis, subsequent encounter: Secondary | ICD-10-CM | POA: Diagnosis not present

## 2015-03-11 DIAGNOSIS — Z87891 Personal history of nicotine dependence: Secondary | ICD-10-CM | POA: Diagnosis not present

## 2015-03-11 DIAGNOSIS — Z96641 Presence of right artificial hip joint: Secondary | ICD-10-CM | POA: Diagnosis not present

## 2015-03-11 DIAGNOSIS — Z885 Allergy status to narcotic agent status: Secondary | ICD-10-CM | POA: Diagnosis not present

## 2015-03-11 DIAGNOSIS — T8450XD Infection and inflammatory reaction due to unspecified internal joint prosthesis, subsequent encounter: Secondary | ICD-10-CM | POA: Diagnosis not present

## 2015-03-12 DIAGNOSIS — Z471 Aftercare following joint replacement surgery: Secondary | ICD-10-CM | POA: Diagnosis not present

## 2015-03-12 DIAGNOSIS — R2689 Other abnormalities of gait and mobility: Secondary | ICD-10-CM | POA: Diagnosis not present

## 2015-03-12 DIAGNOSIS — M6281 Muscle weakness (generalized): Secondary | ICD-10-CM | POA: Diagnosis not present

## 2015-03-17 DIAGNOSIS — R2689 Other abnormalities of gait and mobility: Secondary | ICD-10-CM | POA: Diagnosis not present

## 2015-03-17 DIAGNOSIS — Z471 Aftercare following joint replacement surgery: Secondary | ICD-10-CM | POA: Diagnosis not present

## 2015-03-17 DIAGNOSIS — M6281 Muscle weakness (generalized): Secondary | ICD-10-CM | POA: Diagnosis not present

## 2015-03-19 DIAGNOSIS — M6281 Muscle weakness (generalized): Secondary | ICD-10-CM | POA: Diagnosis not present

## 2015-03-19 DIAGNOSIS — R2689 Other abnormalities of gait and mobility: Secondary | ICD-10-CM | POA: Diagnosis not present

## 2015-03-19 DIAGNOSIS — Z471 Aftercare following joint replacement surgery: Secondary | ICD-10-CM | POA: Diagnosis not present

## 2015-03-21 DIAGNOSIS — R2689 Other abnormalities of gait and mobility: Secondary | ICD-10-CM | POA: Diagnosis not present

## 2015-03-21 DIAGNOSIS — Z471 Aftercare following joint replacement surgery: Secondary | ICD-10-CM | POA: Diagnosis not present

## 2015-03-21 DIAGNOSIS — M6281 Muscle weakness (generalized): Secondary | ICD-10-CM | POA: Diagnosis not present

## 2015-03-24 DIAGNOSIS — Z471 Aftercare following joint replacement surgery: Secondary | ICD-10-CM | POA: Diagnosis not present

## 2015-03-24 DIAGNOSIS — M6281 Muscle weakness (generalized): Secondary | ICD-10-CM | POA: Diagnosis not present

## 2015-03-24 DIAGNOSIS — R2689 Other abnormalities of gait and mobility: Secondary | ICD-10-CM | POA: Diagnosis not present

## 2015-03-25 DIAGNOSIS — Z87891 Personal history of nicotine dependence: Secondary | ICD-10-CM | POA: Diagnosis not present

## 2015-03-25 DIAGNOSIS — M14672 Charcot's joint, left ankle and foot: Secondary | ICD-10-CM | POA: Diagnosis present

## 2015-03-25 DIAGNOSIS — K2270 Barrett's esophagus without dysplasia: Secondary | ICD-10-CM | POA: Diagnosis present

## 2015-03-25 DIAGNOSIS — M1990 Unspecified osteoarthritis, unspecified site: Secondary | ICD-10-CM | POA: Diagnosis present

## 2015-03-25 DIAGNOSIS — K219 Gastro-esophageal reflux disease without esophagitis: Secondary | ICD-10-CM | POA: Diagnosis present

## 2015-03-25 DIAGNOSIS — Z96653 Presence of artificial knee joint, bilateral: Secondary | ICD-10-CM | POA: Diagnosis present

## 2015-03-25 DIAGNOSIS — Z7982 Long term (current) use of aspirin: Secondary | ICD-10-CM | POA: Diagnosis not present

## 2015-03-25 DIAGNOSIS — H3530 Unspecified macular degeneration: Secondary | ICD-10-CM | POA: Diagnosis present

## 2015-03-25 DIAGNOSIS — G629 Polyneuropathy, unspecified: Secondary | ICD-10-CM | POA: Diagnosis present

## 2015-03-25 DIAGNOSIS — Z9841 Cataract extraction status, right eye: Secondary | ICD-10-CM | POA: Diagnosis not present

## 2015-03-25 DIAGNOSIS — D649 Anemia, unspecified: Secondary | ICD-10-CM | POA: Diagnosis present

## 2015-03-25 DIAGNOSIS — Z96641 Presence of right artificial hip joint: Secondary | ICD-10-CM | POA: Diagnosis present

## 2015-03-25 DIAGNOSIS — I10 Essential (primary) hypertension: Secondary | ICD-10-CM | POA: Diagnosis present

## 2015-03-25 DIAGNOSIS — E785 Hyperlipidemia, unspecified: Secondary | ICD-10-CM | POA: Diagnosis present

## 2015-03-25 DIAGNOSIS — N400 Enlarged prostate without lower urinary tract symptoms: Secondary | ICD-10-CM | POA: Diagnosis present

## 2015-03-25 DIAGNOSIS — Z9842 Cataract extraction status, left eye: Secondary | ICD-10-CM | POA: Diagnosis not present

## 2015-03-25 DIAGNOSIS — Z8249 Family history of ischemic heart disease and other diseases of the circulatory system: Secondary | ICD-10-CM | POA: Diagnosis not present

## 2015-03-25 DIAGNOSIS — H9191 Unspecified hearing loss, right ear: Secondary | ICD-10-CM | POA: Diagnosis present

## 2015-03-25 DIAGNOSIS — I714 Abdominal aortic aneurysm, without rupture: Secondary | ICD-10-CM | POA: Diagnosis present

## 2015-03-25 DIAGNOSIS — I482 Chronic atrial fibrillation: Secondary | ICD-10-CM | POA: Diagnosis present

## 2015-03-31 DIAGNOSIS — Z471 Aftercare following joint replacement surgery: Secondary | ICD-10-CM | POA: Diagnosis not present

## 2015-03-31 DIAGNOSIS — M6281 Muscle weakness (generalized): Secondary | ICD-10-CM | POA: Diagnosis not present

## 2015-03-31 DIAGNOSIS — R2689 Other abnormalities of gait and mobility: Secondary | ICD-10-CM | POA: Diagnosis not present

## 2015-04-02 DIAGNOSIS — L821 Other seborrheic keratosis: Secondary | ICD-10-CM | POA: Diagnosis not present

## 2015-04-02 DIAGNOSIS — Z471 Aftercare following joint replacement surgery: Secondary | ICD-10-CM | POA: Diagnosis not present

## 2015-04-02 DIAGNOSIS — R202 Paresthesia of skin: Secondary | ICD-10-CM | POA: Diagnosis not present

## 2015-04-02 DIAGNOSIS — D485 Neoplasm of uncertain behavior of skin: Secondary | ICD-10-CM | POA: Diagnosis not present

## 2015-04-02 DIAGNOSIS — R2689 Other abnormalities of gait and mobility: Secondary | ICD-10-CM | POA: Diagnosis not present

## 2015-04-02 DIAGNOSIS — L814 Other melanin hyperpigmentation: Secondary | ICD-10-CM | POA: Diagnosis not present

## 2015-04-02 DIAGNOSIS — D1801 Hemangioma of skin and subcutaneous tissue: Secondary | ICD-10-CM | POA: Diagnosis not present

## 2015-04-02 DIAGNOSIS — D225 Melanocytic nevi of trunk: Secondary | ICD-10-CM | POA: Diagnosis not present

## 2015-04-02 DIAGNOSIS — M6281 Muscle weakness (generalized): Secondary | ICD-10-CM | POA: Diagnosis not present

## 2015-04-03 DIAGNOSIS — C44519 Basal cell carcinoma of skin of other part of trunk: Secondary | ICD-10-CM | POA: Diagnosis not present

## 2015-04-04 DIAGNOSIS — R2689 Other abnormalities of gait and mobility: Secondary | ICD-10-CM | POA: Diagnosis not present

## 2015-04-04 DIAGNOSIS — Z471 Aftercare following joint replacement surgery: Secondary | ICD-10-CM | POA: Diagnosis not present

## 2015-04-04 DIAGNOSIS — M6281 Muscle weakness (generalized): Secondary | ICD-10-CM | POA: Diagnosis not present

## 2015-04-07 DIAGNOSIS — R2689 Other abnormalities of gait and mobility: Secondary | ICD-10-CM | POA: Diagnosis not present

## 2015-04-07 DIAGNOSIS — M6281 Muscle weakness (generalized): Secondary | ICD-10-CM | POA: Diagnosis not present

## 2015-04-07 DIAGNOSIS — Z471 Aftercare following joint replacement surgery: Secondary | ICD-10-CM | POA: Diagnosis not present

## 2015-04-09 DIAGNOSIS — Z471 Aftercare following joint replacement surgery: Secondary | ICD-10-CM | POA: Diagnosis not present

## 2015-04-09 DIAGNOSIS — M6281 Muscle weakness (generalized): Secondary | ICD-10-CM | POA: Diagnosis not present

## 2015-04-09 DIAGNOSIS — R2689 Other abnormalities of gait and mobility: Secondary | ICD-10-CM | POA: Diagnosis not present

## 2015-04-15 DIAGNOSIS — Z471 Aftercare following joint replacement surgery: Secondary | ICD-10-CM | POA: Diagnosis not present

## 2015-04-15 DIAGNOSIS — R2689 Other abnormalities of gait and mobility: Secondary | ICD-10-CM | POA: Diagnosis not present

## 2015-04-15 DIAGNOSIS — M6281 Muscle weakness (generalized): Secondary | ICD-10-CM | POA: Diagnosis not present

## 2015-04-17 DIAGNOSIS — R2689 Other abnormalities of gait and mobility: Secondary | ICD-10-CM | POA: Diagnosis not present

## 2015-04-17 DIAGNOSIS — M6281 Muscle weakness (generalized): Secondary | ICD-10-CM | POA: Diagnosis not present

## 2015-04-17 DIAGNOSIS — Z471 Aftercare following joint replacement surgery: Secondary | ICD-10-CM | POA: Diagnosis not present

## 2015-04-21 DIAGNOSIS — R2689 Other abnormalities of gait and mobility: Secondary | ICD-10-CM | POA: Diagnosis not present

## 2015-04-21 DIAGNOSIS — M6281 Muscle weakness (generalized): Secondary | ICD-10-CM | POA: Diagnosis not present

## 2015-04-21 DIAGNOSIS — Z471 Aftercare following joint replacement surgery: Secondary | ICD-10-CM | POA: Diagnosis not present

## 2015-04-23 DIAGNOSIS — Z471 Aftercare following joint replacement surgery: Secondary | ICD-10-CM | POA: Diagnosis not present

## 2015-04-23 DIAGNOSIS — M6281 Muscle weakness (generalized): Secondary | ICD-10-CM | POA: Diagnosis not present

## 2015-04-23 DIAGNOSIS — R2689 Other abnormalities of gait and mobility: Secondary | ICD-10-CM | POA: Diagnosis not present

## 2015-04-25 DIAGNOSIS — R2689 Other abnormalities of gait and mobility: Secondary | ICD-10-CM | POA: Diagnosis not present

## 2015-04-25 DIAGNOSIS — Z471 Aftercare following joint replacement surgery: Secondary | ICD-10-CM | POA: Diagnosis not present

## 2015-04-25 DIAGNOSIS — M6281 Muscle weakness (generalized): Secondary | ICD-10-CM | POA: Diagnosis not present

## 2015-04-30 DIAGNOSIS — M6281 Muscle weakness (generalized): Secondary | ICD-10-CM | POA: Diagnosis not present

## 2015-04-30 DIAGNOSIS — Z471 Aftercare following joint replacement surgery: Secondary | ICD-10-CM | POA: Diagnosis not present

## 2015-04-30 DIAGNOSIS — R2689 Other abnormalities of gait and mobility: Secondary | ICD-10-CM | POA: Diagnosis not present

## 2015-05-02 DIAGNOSIS — M6281 Muscle weakness (generalized): Secondary | ICD-10-CM | POA: Diagnosis not present

## 2015-05-02 DIAGNOSIS — R2689 Other abnormalities of gait and mobility: Secondary | ICD-10-CM | POA: Diagnosis not present

## 2015-05-02 DIAGNOSIS — Z471 Aftercare following joint replacement surgery: Secondary | ICD-10-CM | POA: Diagnosis not present

## 2015-05-05 DIAGNOSIS — Z471 Aftercare following joint replacement surgery: Secondary | ICD-10-CM | POA: Diagnosis not present

## 2015-05-05 DIAGNOSIS — M6281 Muscle weakness (generalized): Secondary | ICD-10-CM | POA: Diagnosis not present

## 2015-05-05 DIAGNOSIS — R2689 Other abnormalities of gait and mobility: Secondary | ICD-10-CM | POA: Diagnosis not present

## 2015-05-07 DIAGNOSIS — M6281 Muscle weakness (generalized): Secondary | ICD-10-CM | POA: Diagnosis not present

## 2015-05-07 DIAGNOSIS — R2689 Other abnormalities of gait and mobility: Secondary | ICD-10-CM | POA: Diagnosis not present

## 2015-05-07 DIAGNOSIS — Z471 Aftercare following joint replacement surgery: Secondary | ICD-10-CM | POA: Diagnosis not present

## 2015-05-08 DIAGNOSIS — S060X0A Concussion without loss of consciousness, initial encounter: Secondary | ICD-10-CM | POA: Diagnosis not present

## 2015-05-08 DIAGNOSIS — H903 Sensorineural hearing loss, bilateral: Secondary | ICD-10-CM | POA: Diagnosis not present

## 2015-05-09 DIAGNOSIS — Z471 Aftercare following joint replacement surgery: Secondary | ICD-10-CM | POA: Diagnosis not present

## 2015-05-09 DIAGNOSIS — M6281 Muscle weakness (generalized): Secondary | ICD-10-CM | POA: Diagnosis not present

## 2015-05-09 DIAGNOSIS — R2689 Other abnormalities of gait and mobility: Secondary | ICD-10-CM | POA: Diagnosis not present

## 2015-05-12 DIAGNOSIS — Z471 Aftercare following joint replacement surgery: Secondary | ICD-10-CM | POA: Diagnosis not present

## 2015-05-12 DIAGNOSIS — M6281 Muscle weakness (generalized): Secondary | ICD-10-CM | POA: Diagnosis not present

## 2015-05-12 DIAGNOSIS — R2689 Other abnormalities of gait and mobility: Secondary | ICD-10-CM | POA: Diagnosis not present

## 2015-05-14 DIAGNOSIS — R2689 Other abnormalities of gait and mobility: Secondary | ICD-10-CM | POA: Diagnosis not present

## 2015-05-14 DIAGNOSIS — C44519 Basal cell carcinoma of skin of other part of trunk: Secondary | ICD-10-CM | POA: Diagnosis not present

## 2015-05-14 DIAGNOSIS — Z471 Aftercare following joint replacement surgery: Secondary | ICD-10-CM | POA: Diagnosis not present

## 2015-05-14 DIAGNOSIS — M6281 Muscle weakness (generalized): Secondary | ICD-10-CM | POA: Diagnosis not present

## 2015-05-16 DIAGNOSIS — R2689 Other abnormalities of gait and mobility: Secondary | ICD-10-CM | POA: Diagnosis not present

## 2015-05-16 DIAGNOSIS — M6281 Muscle weakness (generalized): Secondary | ICD-10-CM | POA: Diagnosis not present

## 2015-05-16 DIAGNOSIS — Z471 Aftercare following joint replacement surgery: Secondary | ICD-10-CM | POA: Diagnosis not present

## 2015-05-20 DIAGNOSIS — R2689 Other abnormalities of gait and mobility: Secondary | ICD-10-CM | POA: Diagnosis not present

## 2015-05-20 DIAGNOSIS — M6281 Muscle weakness (generalized): Secondary | ICD-10-CM | POA: Diagnosis not present

## 2015-05-20 DIAGNOSIS — Z471 Aftercare following joint replacement surgery: Secondary | ICD-10-CM | POA: Diagnosis not present

## 2015-05-21 DIAGNOSIS — Z471 Aftercare following joint replacement surgery: Secondary | ICD-10-CM | POA: Diagnosis not present

## 2015-05-21 DIAGNOSIS — R2689 Other abnormalities of gait and mobility: Secondary | ICD-10-CM | POA: Diagnosis not present

## 2015-05-21 DIAGNOSIS — M6281 Muscle weakness (generalized): Secondary | ICD-10-CM | POA: Diagnosis not present

## 2015-05-23 DIAGNOSIS — M6281 Muscle weakness (generalized): Secondary | ICD-10-CM | POA: Diagnosis not present

## 2015-05-23 DIAGNOSIS — R2689 Other abnormalities of gait and mobility: Secondary | ICD-10-CM | POA: Diagnosis not present

## 2015-05-23 DIAGNOSIS — N138 Other obstructive and reflux uropathy: Secondary | ICD-10-CM | POA: Diagnosis not present

## 2015-05-23 DIAGNOSIS — N401 Enlarged prostate with lower urinary tract symptoms: Secondary | ICD-10-CM | POA: Diagnosis not present

## 2015-05-23 DIAGNOSIS — Z0000 Encounter for general adult medical examination without abnormal findings: Secondary | ICD-10-CM | POA: Diagnosis not present

## 2015-05-23 DIAGNOSIS — Z471 Aftercare following joint replacement surgery: Secondary | ICD-10-CM | POA: Diagnosis not present

## 2015-06-19 DIAGNOSIS — G63 Polyneuropathy in diseases classified elsewhere: Secondary | ICD-10-CM | POA: Diagnosis not present

## 2015-06-19 DIAGNOSIS — N400 Enlarged prostate without lower urinary tract symptoms: Secondary | ICD-10-CM | POA: Diagnosis not present

## 2015-06-19 DIAGNOSIS — Z7901 Long term (current) use of anticoagulants: Secondary | ICD-10-CM | POA: Diagnosis not present

## 2015-06-19 DIAGNOSIS — I10 Essential (primary) hypertension: Secondary | ICD-10-CM | POA: Diagnosis not present

## 2015-06-19 DIAGNOSIS — Z1389 Encounter for screening for other disorder: Secondary | ICD-10-CM | POA: Diagnosis not present

## 2015-06-19 DIAGNOSIS — K219 Gastro-esophageal reflux disease without esophagitis: Secondary | ICD-10-CM | POA: Diagnosis not present

## 2015-06-19 DIAGNOSIS — E7800 Pure hypercholesterolemia, unspecified: Secondary | ICD-10-CM | POA: Diagnosis not present

## 2015-06-19 DIAGNOSIS — N401 Enlarged prostate with lower urinary tract symptoms: Secondary | ICD-10-CM | POA: Diagnosis not present

## 2015-06-23 DIAGNOSIS — H35371 Puckering of macula, right eye: Secondary | ICD-10-CM | POA: Diagnosis not present

## 2015-06-23 DIAGNOSIS — H353223 Exudative age-related macular degeneration, left eye, with inactive scar: Secondary | ICD-10-CM | POA: Diagnosis not present

## 2015-06-23 DIAGNOSIS — H353113 Nonexudative age-related macular degeneration, right eye, advanced atrophic without subfoveal involvement: Secondary | ICD-10-CM | POA: Diagnosis not present

## 2015-06-23 DIAGNOSIS — H43813 Vitreous degeneration, bilateral: Secondary | ICD-10-CM | POA: Diagnosis not present

## 2015-06-23 DIAGNOSIS — H31012 Macula scars of posterior pole (postinflammatory) (post-traumatic), left eye: Secondary | ICD-10-CM | POA: Diagnosis not present

## 2015-07-02 DIAGNOSIS — I714 Abdominal aortic aneurysm, without rupture: Secondary | ICD-10-CM | POA: Diagnosis not present

## 2015-07-02 DIAGNOSIS — Z9889 Other specified postprocedural states: Secondary | ICD-10-CM | POA: Diagnosis not present

## 2015-07-02 DIAGNOSIS — Z8679 Personal history of other diseases of the circulatory system: Secondary | ICD-10-CM | POA: Diagnosis not present

## 2015-07-02 DIAGNOSIS — M89551 Osteolysis, right thigh: Secondary | ICD-10-CM | POA: Diagnosis not present

## 2015-08-07 DIAGNOSIS — R739 Hyperglycemia, unspecified: Secondary | ICD-10-CM | POA: Diagnosis not present

## 2015-08-07 DIAGNOSIS — Z125 Encounter for screening for malignant neoplasm of prostate: Secondary | ICD-10-CM | POA: Diagnosis not present

## 2015-08-07 DIAGNOSIS — I10 Essential (primary) hypertension: Secondary | ICD-10-CM | POA: Diagnosis not present

## 2015-08-15 DIAGNOSIS — Z1389 Encounter for screening for other disorder: Secondary | ICD-10-CM | POA: Diagnosis not present

## 2015-08-15 DIAGNOSIS — G63 Polyneuropathy in diseases classified elsewhere: Secondary | ICD-10-CM | POA: Diagnosis not present

## 2015-08-15 DIAGNOSIS — R739 Hyperglycemia, unspecified: Secondary | ICD-10-CM | POA: Diagnosis not present

## 2015-08-15 DIAGNOSIS — Z6829 Body mass index (BMI) 29.0-29.9, adult: Secondary | ICD-10-CM | POA: Diagnosis not present

## 2015-08-15 DIAGNOSIS — K2270 Barrett's esophagus without dysplasia: Secondary | ICD-10-CM | POA: Diagnosis not present

## 2015-08-15 DIAGNOSIS — E7800 Pure hypercholesterolemia, unspecified: Secondary | ICD-10-CM | POA: Diagnosis not present

## 2015-08-15 DIAGNOSIS — Z0000 Encounter for general adult medical examination without abnormal findings: Secondary | ICD-10-CM | POA: Diagnosis not present

## 2015-08-15 DIAGNOSIS — I714 Abdominal aortic aneurysm, without rupture: Secondary | ICD-10-CM | POA: Diagnosis not present

## 2015-08-15 DIAGNOSIS — R42 Dizziness and giddiness: Secondary | ICD-10-CM | POA: Diagnosis not present

## 2015-08-15 DIAGNOSIS — N401 Enlarged prostate with lower urinary tract symptoms: Secondary | ICD-10-CM | POA: Diagnosis not present

## 2015-08-15 DIAGNOSIS — H3530 Unspecified macular degeneration: Secondary | ICD-10-CM | POA: Diagnosis not present

## 2015-08-15 DIAGNOSIS — I10 Essential (primary) hypertension: Secondary | ICD-10-CM | POA: Diagnosis not present

## 2015-09-01 DIAGNOSIS — K2270 Barrett's esophagus without dysplasia: Secondary | ICD-10-CM | POA: Diagnosis not present

## 2015-09-01 DIAGNOSIS — Z6829 Body mass index (BMI) 29.0-29.9, adult: Secondary | ICD-10-CM | POA: Diagnosis not present

## 2015-09-01 DIAGNOSIS — G63 Polyneuropathy in diseases classified elsewhere: Secondary | ICD-10-CM | POA: Diagnosis not present

## 2015-09-02 ENCOUNTER — Encounter: Payer: Self-pay | Admitting: Gastroenterology

## 2015-09-25 ENCOUNTER — Encounter: Payer: Self-pay | Admitting: Gastroenterology

## 2015-09-25 ENCOUNTER — Ambulatory Visit (INDEPENDENT_AMBULATORY_CARE_PROVIDER_SITE_OTHER): Payer: Medicare Other | Admitting: Gastroenterology

## 2015-09-25 VITALS — BP 112/60 | HR 68 | Ht 71.0 in | Wt 220.0 lb

## 2015-09-25 DIAGNOSIS — K2270 Barrett's esophagus without dysplasia: Secondary | ICD-10-CM | POA: Diagnosis not present

## 2015-09-25 NOTE — Progress Notes (Signed)
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 He reports a diagnosis of Barrett's esophagus 8-10 years ago, says he has had at least 3 upper endoscopies and never had dysplasia. His last EGD was 45 years ago by his report. He was advised to have another one in 2 years, but he became ill with a poorly healing wound after a hip replacement. He denies pyrosis, but says that is because he takes Prilosec once daily. He was originally on twice daily, but decrease the dose because he was concerned about the possible risk of osteoporosis. He denies dysphagia odynophagia vomiting or weight loss.   ROS:  Review of Systems he denies chest pain dyspnea or dysuria. He has chronic arthralgias, especially in the right hip. Past Medical History: Past Medical History  Diagnosis Date  . Arthritis   . GERD (gastroesophageal reflux disease)   . Hyperlipidemia   . Hypertension   . Polyneuropathy in other diseases classified elsewhere ([place]) 12/20/2013  . Abnormality of gait 12/20/2013  . AAA (abdominal aortic aneurysm) ([place])   . A-fib ([place])   . BPH (benign prostatic hyperplasia)   . Vertigo   . History of concussion   . Right clavicle fracture   . Left tibial fracture   . Macular degeneration Left  . Barrett's esophagus   . Degenerative arthritis   . Transient global amnesia   . HOH (hard of hearing)   . Anxiety and depression   . Spinal stenosis   . Anxiety and depression      Past Surgical History: Past Surgical History  Procedure Laterality Date  . Tonsillectomy and adenoidectomy    . Knee surgery Bilateral     both  knees replaced  . Wrist fracture surgery Right   . Total hip  arthroplasty Right     x 2  . Appendectomy    . Cataract extraction      Bilateral  . Laparoscopic right hemi colectomy    . Abdominal aortic aneurysm repair  2016    stent placement  . Refractive surgery Left     macular degeneration  . Lumbar laminectomy      L4      Family History: Family History  Problem Relation Age of Onset  . Heart disease Sister     AFIB  . Neuropathy Neg Hx     Social History: Social History   Social History  . Marital Status: Married    Spouse Name: N/A  . Number of Children: 4  . Years of Education: N/A   Occupational History  . retired/phy/radiologist    Social History Main Topics  . Smoking status: Former Smoker    Quit date: 04/19/1966  . Smokeless tobacco: Never Used  . Alcohol Use: No  . Drug Use: No  . Sexual Activity: Not Asked   Other Topics Concern  . None   Social History Narrative    Allergies: Allergies  Allergen Reactions  . Other     vicroyl sutures  . Morphine   . Oxycodone     Outpatient Meds: Current Outpatient Prescriptions  Medication Sig Dispense Refill  . Ascorbic Acid (VITAMIN C) 1000 MG tablet Take 1,000 mg by mouth daily.    Marland Kitchen  aspirin 81 MG tablet Take 81 mg by mouth daily.    . Calcium-Magnesium-Vitamin D (CALCIUM 500 PO) Take 1 tablet by mouth daily.    . dabigatran (PRADAXA) 150 MG CAPS capsule Take 150 mg by mouth 2 (two) times daily.    Marland Kitchen ibuprofen (ADVIL,MOTRIN) 200 MG tablet Take by mouth. 600 mg each morning.    Marland Kitchen lisinopril (PRINIVIL,ZESTRIL) 5 MG tablet Take 5 mg by mouth daily.    . LUTEIN PO Take 1 tablet by mouth 2 (two) times daily.    . metoprolol succinate (TOPROL-XL) 50 MG 24 hr tablet Take 50 mg by mouth daily. Take with or immediately following a meal.    . Multiple Vitamin (MULTIVITAMIN) tablet Take 1 tablet by mouth daily.    . Omega-3 Fatty Acids (OMEGA 3 PO) Take 1 tablet by mouth daily.    Marland Kitchen omeprazole (PRILOSEC) 20 MG capsule Take 20 mg by mouth daily.    Marland Kitchen POTASSIUM PO  Take 1 tablet by mouth daily.    . pregabalin (LYRICA) 75 MG capsule Take 75 mg by mouth 4 (four) times daily as needed.    . ranitidine (ZANTAC) 150 MG tablet Take 150 mg by mouth 2 (two) times daily.    . Selenium 200 MCG TABS Take 1 tablet by mouth daily.    . simvastatin (ZOCOR) 20 MG tablet Take 20 mg by mouth daily.     . tamsulosin (FLOMAX) 0.4 MG CAPS capsule Take 0.4 mg by mouth daily.     Marland Kitchen zinc gluconate 50 MG tablet Take 50 mg by mouth daily.     No current facility-administered medications for this visit.      ___________________________________________________________________ Objective  Exam:  BP 112/60 mmHg  Pulse 68  Ht 5\' 11"  (1.803 m)  Wt 220 lb (99.791 kg)  BMI 30.70 kg/m2   General: this is a(n) Well-appearing elderly man using a walker with an antalgic gait  Remainder of exam not performed. Total 30 minute visit, all of which spent discussing reflux and Barrett's esophagus.   Assessment: Encounter Diagnosis  Name Primary?  . Barrett's esophagus without dysplasia Yes    He has no red flag upper GI symptoms. We discussed that there are no established guidelines for the age at which surveillance EGD for Barrett's should be stopped. Given that he had multiple endoscopies with no dysplasia, he is in the lower risk category for progression to dysplasia or malignancy. In general, many experts in the field of Barrett's esophagus say that surveillance endoscopy can stop at about age 80.  Plan:  No EGD Once daily PPI  Thank you for the courtesy of this consult.  Please call me with any questions or concerns.  Nelida Meuse III  CC: Haywood Pao, MD

## 2015-09-25 NOTE — Patient Instructions (Signed)
If you are age 80 or older, your body mass index should be between 23-30. Your Body mass index is 30.7 kg/(m^2). If this is out of the aforementioned range listed, please consider follow up with your Primary Care Provider.  If you are age 61 or younger, your body mass index should be between 19-25. Your Body mass index is 30.7 kg/(m^2). If this is out of the aformentioned range listed, please consider follow up with your Primary Care Provider.   Thank you for choosing [facility]  Dr Wilfrid Lund III

## 2015-10-01 DIAGNOSIS — Z85828 Personal history of other malignant neoplasm of skin: Secondary | ICD-10-CM | POA: Diagnosis not present

## 2015-10-01 DIAGNOSIS — L821 Other seborrheic keratosis: Secondary | ICD-10-CM | POA: Diagnosis not present

## 2015-10-01 DIAGNOSIS — L905 Scar conditions and fibrosis of skin: Secondary | ICD-10-CM | POA: Diagnosis not present

## 2015-10-13 DIAGNOSIS — M14679 Charcot's joint, unspecified ankle and foot: Secondary | ICD-10-CM | POA: Diagnosis not present

## 2015-10-13 DIAGNOSIS — Z0289 Encounter for other administrative examinations: Secondary | ICD-10-CM | POA: Diagnosis not present

## 2015-10-13 DIAGNOSIS — Z6830 Body mass index (BMI) 30.0-30.9, adult: Secondary | ICD-10-CM | POA: Diagnosis not present

## 2015-10-13 DIAGNOSIS — Z96649 Presence of unspecified artificial hip joint: Secondary | ICD-10-CM | POA: Diagnosis not present

## 2015-10-13 DIAGNOSIS — M25551 Pain in right hip: Secondary | ICD-10-CM | POA: Diagnosis not present

## 2015-10-13 DIAGNOSIS — R279 Unspecified lack of coordination: Secondary | ICD-10-CM | POA: Diagnosis not present

## 2015-10-13 DIAGNOSIS — M4806 Spinal stenosis, lumbar region: Secondary | ICD-10-CM | POA: Diagnosis not present

## 2015-10-13 DIAGNOSIS — M21371 Foot drop, right foot: Secondary | ICD-10-CM | POA: Diagnosis not present

## 2015-10-13 DIAGNOSIS — M1990 Unspecified osteoarthritis, unspecified site: Secondary | ICD-10-CM | POA: Diagnosis not present

## 2015-10-17 DIAGNOSIS — B351 Tinea unguium: Secondary | ICD-10-CM | POA: Diagnosis not present

## 2015-10-17 DIAGNOSIS — M79674 Pain in right toe(s): Secondary | ICD-10-CM | POA: Diagnosis not present

## 2015-12-02 DIAGNOSIS — H18411 Arcus senilis, right eye: Secondary | ICD-10-CM | POA: Diagnosis not present

## 2015-12-02 DIAGNOSIS — H353130 Nonexudative age-related macular degeneration, bilateral, stage unspecified: Secondary | ICD-10-CM | POA: Diagnosis not present

## 2015-12-02 DIAGNOSIS — Z961 Presence of intraocular lens: Secondary | ICD-10-CM | POA: Diagnosis not present

## 2015-12-02 DIAGNOSIS — I10 Essential (primary) hypertension: Secondary | ICD-10-CM | POA: Diagnosis not present

## 2016-01-06 DIAGNOSIS — Z23 Encounter for immunization: Secondary | ICD-10-CM | POA: Diagnosis not present

## 2016-01-06 DIAGNOSIS — G63 Polyneuropathy in diseases classified elsewhere: Secondary | ICD-10-CM | POA: Diagnosis not present

## 2016-01-06 DIAGNOSIS — G629 Polyneuropathy, unspecified: Secondary | ICD-10-CM | POA: Diagnosis not present

## 2016-01-06 DIAGNOSIS — Z0000 Encounter for general adult medical examination without abnormal findings: Secondary | ICD-10-CM | POA: Diagnosis not present

## 2016-01-23 DIAGNOSIS — B351 Tinea unguium: Secondary | ICD-10-CM | POA: Diagnosis not present

## 2016-01-23 DIAGNOSIS — M79675 Pain in left toe(s): Secondary | ICD-10-CM | POA: Diagnosis not present

## 2016-03-22 ENCOUNTER — Telehealth: Payer: Self-pay | Admitting: Neurology

## 2016-03-22 DIAGNOSIS — Z6830 Body mass index (BMI) 30.0-30.9, adult: Secondary | ICD-10-CM | POA: Diagnosis not present

## 2016-03-22 DIAGNOSIS — R4189 Other symptoms and signs involving cognitive functions and awareness: Secondary | ICD-10-CM | POA: Diagnosis not present

## 2016-03-22 DIAGNOSIS — I10 Essential (primary) hypertension: Secondary | ICD-10-CM | POA: Diagnosis not present

## 2016-03-22 DIAGNOSIS — Z7901 Long term (current) use of anticoagulants: Secondary | ICD-10-CM | POA: Diagnosis not present

## 2016-03-22 NOTE — Telephone Encounter (Signed)
Pt's wife called, says the pt is having global transient amnesia-said she knows it is this bc he's had several episode over the past few yrs. She said PCP will see him at 4pm today. She said he is foggy with the year and address. She said she wants Rn to call her back and let her know if she is doing the right thing. She was advised that Dr Jannifer Franklin has not seen the pt for this condition and could not give any advise and recommended she take him to ED or wait until PCP sees him today. Wife is very pushy and insistent that Rn call her back.

## 2016-05-31 DIAGNOSIS — N401 Enlarged prostate with lower urinary tract symptoms: Secondary | ICD-10-CM | POA: Diagnosis not present

## 2016-05-31 DIAGNOSIS — R3912 Poor urinary stream: Secondary | ICD-10-CM | POA: Diagnosis not present

## 2016-05-31 DIAGNOSIS — R351 Nocturia: Secondary | ICD-10-CM | POA: Diagnosis not present

## 2016-06-01 DIAGNOSIS — I714 Abdominal aortic aneurysm, without rupture: Secondary | ICD-10-CM | POA: Diagnosis not present

## 2016-06-01 DIAGNOSIS — I482 Chronic atrial fibrillation: Secondary | ICD-10-CM | POA: Diagnosis not present

## 2016-06-01 DIAGNOSIS — E7800 Pure hypercholesterolemia, unspecified: Secondary | ICD-10-CM | POA: Diagnosis not present

## 2016-06-01 DIAGNOSIS — I10 Essential (primary) hypertension: Secondary | ICD-10-CM | POA: Diagnosis not present

## 2016-06-01 DIAGNOSIS — Z9889 Other specified postprocedural states: Secondary | ICD-10-CM | POA: Diagnosis not present

## 2016-06-01 DIAGNOSIS — Z8679 Personal history of other diseases of the circulatory system: Secondary | ICD-10-CM | POA: Diagnosis not present

## 2016-06-18 DIAGNOSIS — M21371 Foot drop, right foot: Secondary | ICD-10-CM | POA: Diagnosis not present

## 2016-06-25 DIAGNOSIS — H353223 Exudative age-related macular degeneration, left eye, with inactive scar: Secondary | ICD-10-CM | POA: Diagnosis not present

## 2016-06-25 DIAGNOSIS — H353113 Nonexudative age-related macular degeneration, right eye, advanced atrophic without subfoveal involvement: Secondary | ICD-10-CM | POA: Diagnosis not present

## 2016-06-25 DIAGNOSIS — H35371 Puckering of macula, right eye: Secondary | ICD-10-CM | POA: Diagnosis not present

## 2016-06-25 DIAGNOSIS — H43813 Vitreous degeneration, bilateral: Secondary | ICD-10-CM | POA: Diagnosis not present

## 2016-07-13 DIAGNOSIS — H903 Sensorineural hearing loss, bilateral: Secondary | ICD-10-CM | POA: Diagnosis not present

## 2016-07-13 DIAGNOSIS — S060X0A Concussion without loss of consciousness, initial encounter: Secondary | ICD-10-CM | POA: Diagnosis not present

## 2016-07-14 DIAGNOSIS — Z9889 Other specified postprocedural states: Secondary | ICD-10-CM | POA: Diagnosis not present

## 2016-07-14 DIAGNOSIS — I714 Abdominal aortic aneurysm, without rupture: Secondary | ICD-10-CM | POA: Diagnosis not present

## 2016-07-14 DIAGNOSIS — Z8679 Personal history of other diseases of the circulatory system: Secondary | ICD-10-CM | POA: Diagnosis not present

## 2016-07-16 DIAGNOSIS — M79674 Pain in right toe(s): Secondary | ICD-10-CM | POA: Diagnosis not present

## 2016-07-16 DIAGNOSIS — M79675 Pain in left toe(s): Secondary | ICD-10-CM | POA: Diagnosis not present

## 2016-07-16 DIAGNOSIS — B351 Tinea unguium: Secondary | ICD-10-CM | POA: Diagnosis not present

## 2016-08-13 DIAGNOSIS — Z125 Encounter for screening for malignant neoplasm of prostate: Secondary | ICD-10-CM | POA: Diagnosis not present

## 2016-08-13 DIAGNOSIS — I10 Essential (primary) hypertension: Secondary | ICD-10-CM | POA: Diagnosis not present

## 2016-08-13 DIAGNOSIS — E7800 Pure hypercholesterolemia, unspecified: Secondary | ICD-10-CM | POA: Diagnosis not present

## 2016-08-20 DIAGNOSIS — E7800 Pure hypercholesterolemia, unspecified: Secondary | ICD-10-CM | POA: Diagnosis not present

## 2016-08-20 DIAGNOSIS — Z0000 Encounter for general adult medical examination without abnormal findings: Secondary | ICD-10-CM | POA: Diagnosis not present

## 2016-08-20 DIAGNOSIS — M21371 Foot drop, right foot: Secondary | ICD-10-CM | POA: Diagnosis not present

## 2016-08-20 DIAGNOSIS — I714 Abdominal aortic aneurysm, without rupture: Secondary | ICD-10-CM | POA: Diagnosis not present

## 2016-08-20 DIAGNOSIS — I10 Essential (primary) hypertension: Secondary | ICD-10-CM | POA: Diagnosis not present

## 2016-08-20 DIAGNOSIS — Z7901 Long term (current) use of anticoagulants: Secondary | ICD-10-CM | POA: Diagnosis not present

## 2016-08-20 DIAGNOSIS — Z6831 Body mass index (BMI) 31.0-31.9, adult: Secondary | ICD-10-CM | POA: Diagnosis not present

## 2016-08-20 DIAGNOSIS — M1990 Unspecified osteoarthritis, unspecified site: Secondary | ICD-10-CM | POA: Diagnosis not present

## 2016-08-20 DIAGNOSIS — Z1389 Encounter for screening for other disorder: Secondary | ICD-10-CM | POA: Diagnosis not present

## 2016-08-20 DIAGNOSIS — M48061 Spinal stenosis, lumbar region without neurogenic claudication: Secondary | ICD-10-CM | POA: Diagnosis not present

## 2016-08-20 DIAGNOSIS — M14679 Charcot's joint, unspecified ankle and foot: Secondary | ICD-10-CM | POA: Diagnosis not present

## 2016-08-20 DIAGNOSIS — R278 Other lack of coordination: Secondary | ICD-10-CM | POA: Diagnosis not present

## 2016-09-17 DIAGNOSIS — M79675 Pain in left toe(s): Secondary | ICD-10-CM | POA: Diagnosis not present

## 2016-09-17 DIAGNOSIS — M79674 Pain in right toe(s): Secondary | ICD-10-CM | POA: Diagnosis not present

## 2016-09-17 DIAGNOSIS — B351 Tinea unguium: Secondary | ICD-10-CM | POA: Diagnosis not present

## 2016-10-15 DIAGNOSIS — L821 Other seborrheic keratosis: Secondary | ICD-10-CM | POA: Diagnosis not present

## 2016-10-15 DIAGNOSIS — D225 Melanocytic nevi of trunk: Secondary | ICD-10-CM | POA: Diagnosis not present

## 2016-10-15 DIAGNOSIS — L814 Other melanin hyperpigmentation: Secondary | ICD-10-CM | POA: Diagnosis not present

## 2016-10-15 DIAGNOSIS — Z85828 Personal history of other malignant neoplasm of skin: Secondary | ICD-10-CM | POA: Diagnosis not present

## 2016-10-15 DIAGNOSIS — L570 Actinic keratosis: Secondary | ICD-10-CM | POA: Diagnosis not present

## 2016-11-19 DIAGNOSIS — M79674 Pain in right toe(s): Secondary | ICD-10-CM | POA: Diagnosis not present

## 2016-11-19 DIAGNOSIS — B351 Tinea unguium: Secondary | ICD-10-CM | POA: Diagnosis not present

## 2016-12-15 DIAGNOSIS — Z23 Encounter for immunization: Secondary | ICD-10-CM | POA: Diagnosis not present

## 2017-01-12 DIAGNOSIS — Z9889 Other specified postprocedural states: Secondary | ICD-10-CM | POA: Diagnosis not present

## 2017-01-12 DIAGNOSIS — Z8679 Personal history of other diseases of the circulatory system: Secondary | ICD-10-CM | POA: Diagnosis not present

## 2017-01-12 DIAGNOSIS — I10 Essential (primary) hypertension: Secondary | ICD-10-CM | POA: Diagnosis not present

## 2017-01-12 DIAGNOSIS — I272 Pulmonary hypertension, unspecified: Secondary | ICD-10-CM | POA: Diagnosis not present

## 2017-01-12 DIAGNOSIS — I482 Chronic atrial fibrillation: Secondary | ICD-10-CM | POA: Diagnosis not present

## 2017-01-12 DIAGNOSIS — I714 Abdominal aortic aneurysm, without rupture: Secondary | ICD-10-CM | POA: Diagnosis not present

## 2017-01-21 DIAGNOSIS — M79674 Pain in right toe(s): Secondary | ICD-10-CM | POA: Diagnosis not present

## 2017-01-21 DIAGNOSIS — B351 Tinea unguium: Secondary | ICD-10-CM | POA: Diagnosis not present

## 2017-01-21 DIAGNOSIS — M79675 Pain in left toe(s): Secondary | ICD-10-CM | POA: Diagnosis not present

## 2017-02-17 DIAGNOSIS — I714 Abdominal aortic aneurysm, without rupture: Secondary | ICD-10-CM | POA: Diagnosis not present

## 2017-02-17 DIAGNOSIS — R7309 Other abnormal glucose: Secondary | ICD-10-CM | POA: Diagnosis not present

## 2017-02-17 DIAGNOSIS — Z7901 Long term (current) use of anticoagulants: Secondary | ICD-10-CM | POA: Diagnosis not present

## 2017-02-17 DIAGNOSIS — Z6828 Body mass index (BMI) 28.0-28.9, adult: Secondary | ICD-10-CM | POA: Diagnosis not present

## 2017-02-17 DIAGNOSIS — N401 Enlarged prostate with lower urinary tract symptoms: Secondary | ICD-10-CM | POA: Diagnosis not present

## 2017-02-17 DIAGNOSIS — M1990 Unspecified osteoarthritis, unspecified site: Secondary | ICD-10-CM | POA: Diagnosis not present

## 2017-02-17 DIAGNOSIS — H3530 Unspecified macular degeneration: Secondary | ICD-10-CM | POA: Diagnosis not present

## 2017-02-17 DIAGNOSIS — I10 Essential (primary) hypertension: Secondary | ICD-10-CM | POA: Diagnosis not present

## 2017-02-17 DIAGNOSIS — K219 Gastro-esophageal reflux disease without esophagitis: Secondary | ICD-10-CM | POA: Diagnosis not present

## 2017-02-17 DIAGNOSIS — E7800 Pure hypercholesterolemia, unspecified: Secondary | ICD-10-CM | POA: Diagnosis not present

## 2017-02-17 DIAGNOSIS — G63 Polyneuropathy in diseases classified elsewhere: Secondary | ICD-10-CM | POA: Diagnosis not present

## 2017-05-27 DIAGNOSIS — M79675 Pain in left toe(s): Secondary | ICD-10-CM | POA: Diagnosis not present

## 2017-05-27 DIAGNOSIS — B351 Tinea unguium: Secondary | ICD-10-CM | POA: Diagnosis not present

## 2017-05-27 DIAGNOSIS — M79674 Pain in right toe(s): Secondary | ICD-10-CM | POA: Diagnosis not present

## 2017-06-10 DIAGNOSIS — M14672 Charcot's joint, left ankle and foot: Secondary | ICD-10-CM | POA: Diagnosis not present

## 2017-06-10 DIAGNOSIS — L97521 Non-pressure chronic ulcer of other part of left foot limited to breakdown of skin: Secondary | ICD-10-CM | POA: Diagnosis not present

## 2017-06-22 DIAGNOSIS — L089 Local infection of the skin and subcutaneous tissue, unspecified: Secondary | ICD-10-CM | POA: Diagnosis not present

## 2017-06-22 DIAGNOSIS — S90812D Abrasion, left foot, subsequent encounter: Secondary | ICD-10-CM | POA: Diagnosis not present

## 2017-06-22 DIAGNOSIS — L97511 Non-pressure chronic ulcer of other part of right foot limited to breakdown of skin: Secondary | ICD-10-CM | POA: Diagnosis not present

## 2017-07-12 DIAGNOSIS — R3912 Poor urinary stream: Secondary | ICD-10-CM | POA: Diagnosis not present

## 2017-07-12 DIAGNOSIS — R351 Nocturia: Secondary | ICD-10-CM | POA: Diagnosis not present

## 2017-07-12 DIAGNOSIS — N401 Enlarged prostate with lower urinary tract symptoms: Secondary | ICD-10-CM | POA: Diagnosis not present

## 2017-07-13 DIAGNOSIS — L089 Local infection of the skin and subcutaneous tissue, unspecified: Secondary | ICD-10-CM | POA: Diagnosis not present

## 2017-07-13 DIAGNOSIS — S90812D Abrasion, left foot, subsequent encounter: Secondary | ICD-10-CM | POA: Diagnosis not present

## 2017-07-13 DIAGNOSIS — L97511 Non-pressure chronic ulcer of other part of right foot limited to breakdown of skin: Secondary | ICD-10-CM | POA: Diagnosis not present

## 2017-07-27 DIAGNOSIS — I714 Abdominal aortic aneurysm, without rupture: Secondary | ICD-10-CM | POA: Diagnosis not present

## 2017-07-27 DIAGNOSIS — Z95828 Presence of other vascular implants and grafts: Secondary | ICD-10-CM | POA: Diagnosis not present

## 2017-07-29 DIAGNOSIS — M79675 Pain in left toe(s): Secondary | ICD-10-CM | POA: Diagnosis not present

## 2017-07-29 DIAGNOSIS — M79674 Pain in right toe(s): Secondary | ICD-10-CM | POA: Diagnosis not present

## 2017-07-29 DIAGNOSIS — B351 Tinea unguium: Secondary | ICD-10-CM | POA: Diagnosis not present

## 2017-08-10 DIAGNOSIS — L089 Local infection of the skin and subcutaneous tissue, unspecified: Secondary | ICD-10-CM | POA: Diagnosis not present

## 2017-08-10 DIAGNOSIS — L97511 Non-pressure chronic ulcer of other part of right foot limited to breakdown of skin: Secondary | ICD-10-CM | POA: Diagnosis not present

## 2017-08-10 DIAGNOSIS — S90812D Abrasion, left foot, subsequent encounter: Secondary | ICD-10-CM | POA: Diagnosis not present

## 2017-08-13 ENCOUNTER — Emergency Department (HOSPITAL_BASED_OUTPATIENT_CLINIC_OR_DEPARTMENT_OTHER): Payer: Medicare Other

## 2017-08-13 ENCOUNTER — Emergency Department (HOSPITAL_BASED_OUTPATIENT_CLINIC_OR_DEPARTMENT_OTHER)
Admission: EM | Admit: 2017-08-13 | Discharge: 2017-08-13 | Disposition: A | Discharge status: Home / Self Care | Payer: Medicare Other | Source: Home / Self Care | Attending: Emergency Medicine | Admitting: Emergency Medicine

## 2017-08-13 ENCOUNTER — Encounter (HOSPITAL_BASED_OUTPATIENT_CLINIC_OR_DEPARTMENT_OTHER): Payer: Self-pay

## 2017-08-13 ENCOUNTER — Other Ambulatory Visit: Payer: Self-pay

## 2017-08-13 DIAGNOSIS — I10 Essential (primary) hypertension: Secondary | ICD-10-CM | POA: Insufficient documentation

## 2017-08-13 DIAGNOSIS — J181 Lobar pneumonia, unspecified organism: Secondary | ICD-10-CM | POA: Diagnosis not present

## 2017-08-13 DIAGNOSIS — J069 Acute upper respiratory infection, unspecified: Secondary | ICD-10-CM

## 2017-08-13 DIAGNOSIS — E871 Hypo-osmolality and hyponatremia: Secondary | ICD-10-CM | POA: Diagnosis not present

## 2017-08-13 DIAGNOSIS — J189 Pneumonia, unspecified organism: Secondary | ICD-10-CM | POA: Diagnosis not present

## 2017-08-13 DIAGNOSIS — Z96641 Presence of right artificial hip joint: Secondary | ICD-10-CM

## 2017-08-13 DIAGNOSIS — Z96653 Presence of artificial knee joint, bilateral: Secondary | ICD-10-CM

## 2017-08-13 DIAGNOSIS — J9601 Acute respiratory failure with hypoxia: Secondary | ICD-10-CM | POA: Diagnosis not present

## 2017-08-13 DIAGNOSIS — R05 Cough: Secondary | ICD-10-CM | POA: Diagnosis not present

## 2017-08-13 DIAGNOSIS — I482 Chronic atrial fibrillation: Secondary | ICD-10-CM | POA: Diagnosis not present

## 2017-08-13 DIAGNOSIS — R0602 Shortness of breath: Secondary | ICD-10-CM | POA: Diagnosis not present

## 2017-08-13 DIAGNOSIS — Z87891 Personal history of nicotine dependence: Secondary | ICD-10-CM | POA: Insufficient documentation

## 2017-08-13 DIAGNOSIS — E8770 Fluid overload, unspecified: Secondary | ICD-10-CM | POA: Diagnosis not present

## 2017-08-13 DIAGNOSIS — E860 Dehydration: Secondary | ICD-10-CM | POA: Diagnosis not present

## 2017-08-13 MED ORDER — FLUTICASONE PROPIONATE 50 MCG/ACT NA SUSP: 2.0000 | Freq: Every day | NASAL | 0 refills | Status: DC

## 2017-08-13 NOTE — Discharge Instructions (Signed)
You have been seen in the Emergency Department (ED) today for a likely viral illness.  Please drink plenty of clear fluids (water, Gatorade, chicken broth, etc).  You may use Tylenol according to label instructions.  You can alternate between the two without any side effects.  ° °Please follow up with your doctor as listed above. ° °Call your doctor or return to the Emergency Department (ED) if you are unable to tolerate fluids due to vomiting, have worsening trouble breathing, become extremely tired or difficult to awaken, or if you develop any other symptoms that concern you. ° °

## 2017-08-13 NOTE — ED Notes (Signed)
ED Provider at bedside. 

## 2017-08-13 NOTE — ED Notes (Signed)
Patient transported to X-ray 

## 2017-08-13 NOTE — ED Provider Notes (Signed)
Emergency Department Provider Note   I have reviewed the triage vital signs and the nursing notes.   HISTORY  Chief Complaint Cough   HPI BERRY GODSEY is a 84 y.o. male with PMH of a-fib, AAA, GERD, HLD, HTN, and arthritis presents to the ED with cough and nasal congestion.  The patient's symptoms began 4 days prior and have worsened slightly throughout the week.  He called his PCP yesterday and was started on azithromycin.  He has been taking Robitussin DM for symptom relief with no significant improvement.  He has had some associated cough and mild dyspnea without chest pain.  Cough is productive of clear sputum.  No hemoptysis.  Denies any lower extremity edema.  No sick contacts. He does take Allegra regularly.   Past Medical History:  Diagnosis Date  . A-fib ([place])   . AAA (abdominal aortic aneurysm) ([place])   . Abnormality of gait 12/20/2013  . Anxiety and depression   . Anxiety and depression   . Arthritis   . Barrett's esophagus   . BPH (benign prostatic hyperplasia)   . Degenerative arthritis   . GERD (gastroesophageal reflux disease)   . History of concussion   . HOH (hard of hearing)   . Hyperlipidemia   . Hypertension   . Left tibial fracture   . Macular degeneration Left  . Polyneuropathy in other diseases classified elsewhere ([place]) 12/20/2013  . Right clavicle fracture   . Spinal stenosis   . Transient global amnesia   . Vertigo     Patient Active Problem List   Diagnosis Date Noted  . Polyneuropathy in other diseases classified elsewhere ([place]) 12/20/2013  . Abnormality of gait 12/20/2013  . Non-healing right groin open wound 04/09/2013    Past Surgical History:  Procedure Laterality Date  . ABDOMINAL AORTIC ANEURYSM REPAIR  2016   stent placement  . APPENDECTOMY    . CATARACT EXTRACTION     Bilateral  . KNEE SURGERY Bilateral    both  knees replaced  . LAPAROSCOPIC RIGHT HEMI COLECTOMY    . LUMBAR LAMINECTOMY     L4   . REFRACTIVE SURGERY  Left    macular degeneration  . TONSILLECTOMY AND ADENOIDECTOMY    . TOTAL HIP ARTHROPLASTY Right    x 2  . WRIST FRACTURE SURGERY Right     Current Outpatient Rx  . Order #: [id number] Class: Historical Med  . Order #: [id number] Class: Historical Med  . Order #: [id number] Class: Historical Med  . Order #: [id number] Class: Historical Med  . Order #: [id number] Class: Print  . Order #: [id number] Class: Historical Med  . Order #: [id number] Class: Historical Med  . Order #: [id number] Class: Historical Med  . Order #: [id number] Class: Historical Med  . Order #: [id number] Class: Historical Med  . Order #: [id number] Class: Historical Med  . Order #: [id number] Class: Historical Med  . Order #: [id number] Class: Historical Med  . Order #: [id number] Class: Historical Med  . Order #: [id number] Class: Historical Med  . Order #: [id number] Class: Historical Med  . Order #: [id number] Class: Historical Med  . Order #: [id number] Class: Historical Med  . Order #: [id number] Class: Historical Med    Allergies Other; Morphine; and Oxycodone  Family History  Problem Relation Age of Onset  . Heart disease Sister        AFIB  . Neuropathy Neg Hx     Social History Social History   Tobacco Use  . Smoking status: Former Smoker    Last attempt to quit: 04/19/1966  Years since quitting: 51.3  . Smokeless tobacco: Never Used  Substance Use Topics  . Alcohol use: No  . Drug use: No    Review of Systems  Constitutional: No fever/chills Eyes: No visual changes. ENT: No sore throat. Positive nasal congestion.  Cardiovascular: Denies chest pain. Respiratory: Denies shortness of breath. Positive cough.  Gastrointestinal: No abdominal pain. No nausea, no vomiting. No diarrhea. No constipation. Genitourinary: Negative for dysuria. Musculoskeletal: Negative for back pain. Skin: Negative for rash. Neurological: Negative for headaches, focal weakness or numbness.  10-point ROS otherwise  negative.  ____________________________________________   PHYSICAL EXAM:  VITAL SIGNS: ED Triage Vitals  Enc Vitals Group     BP 08/13/17 1655 (!) 138/97     Pulse Rate 08/13/17 1655 73     Resp 08/13/17 1655 (!) 22     Temp 08/13/17 1655 97.9 F (36.6 C)     Temp Source 08/13/17 1655 Oral     SpO2 08/13/17 1655 98 %     Weight 08/13/17 1655 210 lb (95.3 kg)     Height 08/13/17 1655 5\' 11"  (1.803 m)     Pain Score 08/13/17 1654 0   Constitutional: Alert and oriented. Well appearing and in no acute distress. Eyes: Conjunctivae are normal.  Head: Atraumatic. Nose: Positive congestion/rhinnorhea. Mouth/Throat: Mucous membranes are moist.  Oropharynx with mild erythema. No exudate or PTA.  Neck: No stridor. Cardiovascular: Normal rate, regular rhythm. Good peripheral circulation. Grossly normal heart sounds.   Respiratory: Normal respiratory effort.  No retractions. Lungs with faint bilateral end-expiratory wheezing.  Gastrointestinal: Soft and nontender. No distention.  Musculoskeletal: No lower extremity tenderness nor edema. No gross deformities of extremities. Neurologic:  Normal speech and language. No gross focal neurologic deficits are appreciated.  Skin:  Skin is warm, dry and intact. No rash noted.  ____________________________________________  RADIOLOGY  Dg Chest 2 View  Result Date: 08/13/2017 CLINICAL DATA:  Upper respiratory infection. Shortness of breath. No fever. EXAM: CHEST - 2 VIEW COMPARISON:  None. FINDINGS: The heart size is borderline. The hila and mediastinum are normal. No pneumothorax. No nodules or masses. No focal infiltrates identified. IMPRESSION: No active cardiopulmonary disease. Electronically Signed   By: Dorise Bullion III M.D   On: 08/13/2017 17:53    ____________________________________________   PROCEDURES  Procedure(s) performed:   Procedures  None ____________________________________________   INITIAL IMPRESSION / ASSESSMENT  AND PLAN / ED COURSE  Pertinent labs & imaging results that were available during my care of the patient were reviewed by me and considered in my medical decision making (see chart for details).  The emergency department for URI symptoms over the past 4 days.  He was started on azithromycin yesterday with no relief in symptoms.  Patient has no chest pain or reason to strongly consider atypical ACS, new CHF, or PE.  The patient is afebrile here with only mild tachypnea.  He does have very faint end expiratory wheezing  Patient CXR reviewed and negative for infiltrate or edema. Plan for supportive care at home PCP follow up.   At this time, I do not feel there is any life-threatening condition present. I have reviewed and discussed all results (EKG, imaging, lab, urine as appropriate), exam findings with patient. I have reviewed nursing notes and appropriate previous records.  I feel the patient is safe to be discharged home without further emergent workup. Discussed usual and customary return precautions. Patient and family (if present) verbalize understanding and are comfortable with this plan.  Patient will follow-up with their primary care provider. If they do not have a primary care provider, information for follow-up has been provided to them. All questions have been answered.  ____________________________________________  FINAL CLINICAL IMPRESSION(S) / ED DIAGNOSES  Final diagnoses:  Upper respiratory tract infection, unspecified type     NEW OUTPATIENT MEDICATIONS STARTED DURING THIS VISIT:  New Prescriptions   FLUTICASONE (FLONASE) 50 MCG/ACT NASAL SPRAY    Place 2 sprays into both nostrils daily for 7 days.    Note:  This document was prepared using Dragon voice recognition software and may include unintentional dictation errors.  Nanda Quinton, MD Emergency Medicine    Hermon Zea, [facility], MD 08/13/17 [phone]

## 2017-08-13 NOTE — ED Triage Notes (Signed)
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 Pt reports productive cough with clear sputum, nasal drainage, sore throat, decreased appetite, and shortness of breath due to nasal congestion. Denies fever, n/v/d.

## 2017-08-15 ENCOUNTER — Other Ambulatory Visit: Payer: Self-pay

## 2017-08-15 ENCOUNTER — Inpatient Hospital Stay (HOSPITAL_BASED_OUTPATIENT_CLINIC_OR_DEPARTMENT_OTHER)
Admission: EM | Admit: 2017-08-15 | Discharge: 2017-08-17 | DRG: 193 | Disposition: A | Discharge status: Home / Self Care | Payer: Medicare Other | Attending: Internal Medicine | Admitting: Internal Medicine

## 2017-08-15 ENCOUNTER — Emergency Department (HOSPITAL_BASED_OUTPATIENT_CLINIC_OR_DEPARTMENT_OTHER): Payer: Medicare Other

## 2017-08-15 ENCOUNTER — Encounter (HOSPITAL_BASED_OUTPATIENT_CLINIC_OR_DEPARTMENT_OTHER): Payer: Self-pay | Admitting: Emergency Medicine

## 2017-08-15 DIAGNOSIS — N400 Enlarged prostate without lower urinary tract symptoms: Secondary | ICD-10-CM | POA: Diagnosis present

## 2017-08-15 DIAGNOSIS — J181 Lobar pneumonia, unspecified organism: Principal | ICD-10-CM | POA: Diagnosis present

## 2017-08-15 DIAGNOSIS — G629 Polyneuropathy, unspecified: Secondary | ICD-10-CM | POA: Diagnosis present

## 2017-08-15 DIAGNOSIS — Z79899 Other long term (current) drug therapy: Secondary | ICD-10-CM

## 2017-08-15 DIAGNOSIS — I482 Chronic atrial fibrillation: Secondary | ICD-10-CM | POA: Diagnosis present

## 2017-08-15 DIAGNOSIS — K219 Gastro-esophageal reflux disease without esophagitis: Secondary | ICD-10-CM | POA: Diagnosis present

## 2017-08-15 DIAGNOSIS — J069 Acute upper respiratory infection, unspecified: Secondary | ICD-10-CM | POA: Diagnosis present

## 2017-08-15 DIAGNOSIS — E785 Hyperlipidemia, unspecified: Secondary | ICD-10-CM | POA: Diagnosis present

## 2017-08-15 DIAGNOSIS — I340 Nonrheumatic mitral (valve) insufficiency: Secondary | ICD-10-CM | POA: Diagnosis not present

## 2017-08-15 DIAGNOSIS — Z9089 Acquired absence of other organs: Secondary | ICD-10-CM

## 2017-08-15 DIAGNOSIS — H9190 Unspecified hearing loss, unspecified ear: Secondary | ICD-10-CM | POA: Diagnosis present

## 2017-08-15 DIAGNOSIS — Z96641 Presence of right artificial hip joint: Secondary | ICD-10-CM | POA: Diagnosis present

## 2017-08-15 DIAGNOSIS — E8770 Fluid overload, unspecified: Secondary | ICD-10-CM | POA: Diagnosis present

## 2017-08-15 DIAGNOSIS — E8779 Other fluid overload: Secondary | ICD-10-CM | POA: Diagnosis not present

## 2017-08-15 DIAGNOSIS — Z96653 Presence of artificial knee joint, bilateral: Secondary | ICD-10-CM | POA: Diagnosis present

## 2017-08-15 DIAGNOSIS — E871 Hypo-osmolality and hyponatremia: Secondary | ICD-10-CM | POA: Diagnosis present

## 2017-08-15 DIAGNOSIS — I119 Hypertensive heart disease without heart failure: Secondary | ICD-10-CM | POA: Diagnosis present

## 2017-08-15 DIAGNOSIS — J189 Pneumonia, unspecified organism: Secondary | ICD-10-CM

## 2017-08-15 DIAGNOSIS — F329 Major depressive disorder, single episode, unspecified: Secondary | ICD-10-CM | POA: Diagnosis present

## 2017-08-15 DIAGNOSIS — Z8249 Family history of ischemic heart disease and other diseases of the circulatory system: Secondary | ICD-10-CM

## 2017-08-15 DIAGNOSIS — F410 Panic disorder [episodic paroxysmal anxiety] without agoraphobia: Secondary | ICD-10-CM | POA: Diagnosis present

## 2017-08-15 DIAGNOSIS — Z9842 Cataract extraction status, left eye: Secondary | ICD-10-CM

## 2017-08-15 DIAGNOSIS — Z9841 Cataract extraction status, right eye: Secondary | ICD-10-CM | POA: Diagnosis not present

## 2017-08-15 DIAGNOSIS — Z87820 Personal history of traumatic brain injury: Secondary | ICD-10-CM | POA: Diagnosis not present

## 2017-08-15 DIAGNOSIS — E860 Dehydration: Secondary | ICD-10-CM | POA: Diagnosis present

## 2017-08-15 DIAGNOSIS — G63 Polyneuropathy in diseases classified elsewhere: Secondary | ICD-10-CM | POA: Diagnosis not present

## 2017-08-15 DIAGNOSIS — Z7982 Long term (current) use of aspirin: Secondary | ICD-10-CM

## 2017-08-15 DIAGNOSIS — J9601 Acute respiratory failure with hypoxia: Secondary | ICD-10-CM | POA: Diagnosis present

## 2017-08-15 DIAGNOSIS — Z7901 Long term (current) use of anticoagulants: Secondary | ICD-10-CM

## 2017-08-15 DIAGNOSIS — Z9049 Acquired absence of other specified parts of digestive tract: Secondary | ICD-10-CM | POA: Diagnosis not present

## 2017-08-15 DIAGNOSIS — Z8679 Personal history of other diseases of the circulatory system: Secondary | ICD-10-CM | POA: Diagnosis not present

## 2017-08-15 DIAGNOSIS — F411 Generalized anxiety disorder: Secondary | ICD-10-CM

## 2017-08-15 DIAGNOSIS — Z87891 Personal history of nicotine dependence: Secondary | ICD-10-CM

## 2017-08-15 DIAGNOSIS — Z888 Allergy status to other drugs, medicaments and biological substances status: Secondary | ICD-10-CM

## 2017-08-15 DIAGNOSIS — R0602 Shortness of breath: Secondary | ICD-10-CM | POA: Diagnosis not present

## 2017-08-15 DIAGNOSIS — Z885 Allergy status to narcotic agent status: Secondary | ICD-10-CM

## 2017-08-15 LAB — CBC
HCT: 39 % (ref 39.0–52.0)
Hemoglobin: 14.3 g/dL (ref 13.0–17.0)
MCH: 32.6 pg (ref 26.0–34.0)
MCHC: 36.7 g/dL — ABNORMAL HIGH (ref 30.0–36.0)
MCV: 88.8 fL (ref 78.0–100.0)
Platelets: 170 10*3/uL (ref 150–400)
RBC: 4.39 MIL/uL (ref 4.22–5.81)
RDW: 13.8 % (ref 11.5–15.5)
WBC: 9.4 10*3/uL (ref 4.0–10.5)

## 2017-08-15 LAB — COMPREHENSIVE METABOLIC PANEL
ALBUMIN: 3.3 g/dL — AB (ref 3.5–5.0)
ALT: 24 U/L (ref 17–63)
AST: 36 U/L (ref 15–41)
Alkaline Phosphatase: 76 U/L (ref 38–126)
Anion gap: 11 (ref 5–15)
BUN: 20 mg/dL (ref 6–20)
CO2: 20 mmol/L — AB (ref 22–32)
CREATININE: 1.01 mg/dL (ref 0.61–1.24)
Calcium: 8.5 mg/dL — ABNORMAL LOW (ref 8.9–10.3)
Chloride: 88 mmol/L — ABNORMAL LOW (ref 101–111)
GFR calc Af Amer: >60 mL/min (ref >60)
GFR calc non Af Amer: >60 mL/min (ref >60)
Glucose, Bld: 109 mg/dL — ABNORMAL HIGH (ref 65–99)
Potassium: 5 mmol/L (ref 3.5–5.1)
Sodium: 119 mmol/L — CL (ref 135–145)
Total Bilirubin: 2 mg/dL — ABNORMAL HIGH (ref 0.3–1.2)
Total Protein: 6.5 g/dL (ref 6.5–8.1)

## 2017-08-15 LAB — SODIUM: Sodium: 121 mmol/L — ABNORMAL LOW (ref 135–145)

## 2017-08-15 LAB — BRAIN NATRIURETIC PEPTIDE: B Natriuretic Peptide: 486.8 pg/mL — ABNORMAL HIGH (ref 0.0–100.0)

## 2017-08-15 LAB — TROPONIN I: Troponin I: <0.03 ng/mL (ref <0.03)

## 2017-08-15 MED ORDER — AZITHROMYCIN 500 MG IV SOLR
INTRAVENOUS | Status: AC
  Filled 2017-08-15: qty 500

## 2017-08-15 MED ORDER — IPRATROPIUM-ALBUTEROL 0.5-2.5 (3) MG/3ML IN SOLN
3.0000 mL | Freq: Four times a day (QID) | RESPIRATORY_TRACT | Status: DC | PRN
  Administered 2017-08-15: 3 mL via RESPIRATORY_TRACT
  Filled 2017-08-15: qty 3

## 2017-08-15 MED ORDER — ASPIRIN 81 MG PO TABS: 81.0000 mg | ORAL_TABLET | Freq: Every day | ORAL | Status: DC

## 2017-08-15 MED ORDER — FUROSEMIDE 10 MG/ML IJ SOLN
40.0000 mg | Freq: Once | INTRAMUSCULAR | Status: AC
  Administered 2017-08-15: 40 mg via INTRAVENOUS
  Filled 2017-08-15: qty 4

## 2017-08-15 MED ORDER — ZINC GLUCONATE 50 MG PO TABS: 50.0000 mg | ORAL_TABLET | ORAL | Status: DC

## 2017-08-15 MED ORDER — ZINC SULFATE 220 (50 ZN) MG PO CAPS
220.0000 mg | ORAL_CAPSULE | Freq: Every day | ORAL | Status: DC
  Administered 2017-08-16 – 2017-08-17 (×2): 220 mg via ORAL
  Filled 2017-08-15 (×2): qty 1

## 2017-08-15 MED ORDER — TAMSULOSIN HCL 0.4 MG PO CAPS
0.4000 mg | ORAL_CAPSULE | Freq: Every day | ORAL | Status: DC
  Administered 2017-08-15 – 2017-08-16 (×2): 0.4 mg via ORAL
  Filled 2017-08-15 (×2): qty 1

## 2017-08-15 MED ORDER — SODIUM CHLORIDE 0.9 % IV SOLN
1.0000 g | Freq: Once | INTRAVENOUS | Status: AC
  Administered 2017-08-16: 1 g via INTRAVENOUS
  Filled 2017-08-15: qty 1

## 2017-08-15 MED ORDER — ASPIRIN 81 MG PO CHEW
81.0000 mg | CHEWABLE_TABLET | Freq: Every day | ORAL | Status: DC
  Administered 2017-08-15 – 2017-08-16 (×2): 81 mg via ORAL
  Filled 2017-08-15 (×2): qty 1

## 2017-08-15 MED ORDER — FLUTICASONE PROPIONATE 50 MCG/ACT NA SUSP
2.0000 | Freq: Every day | NASAL | Status: DC
  Administered 2017-08-16 – 2017-08-17 (×2): 2 via NASAL
  Filled 2017-08-15: qty 16

## 2017-08-15 MED ORDER — PANTOPRAZOLE SODIUM 40 MG PO TBEC
40.0000 mg | DELAYED_RELEASE_TABLET | Freq: Every day | ORAL | Status: DC
  Administered 2017-08-16 – 2017-08-17 (×2): 40 mg via ORAL
  Filled 2017-08-15 (×2): qty 1

## 2017-08-15 MED ORDER — SODIUM CHLORIDE 0.9 % IV SOLN
500.0000 mg | Freq: Once | INTRAVENOUS | Status: AC
  Administered 2017-08-15: 500 mg via INTRAVENOUS
  Filled 2017-08-15: qty 500

## 2017-08-15 MED ORDER — METOPROLOL SUCCINATE ER 50 MG PO TB24
50.0000 mg | ORAL_TABLET | Freq: Two times a day (BID) | ORAL | Status: DC
  Administered 2017-08-16 – 2017-08-17 (×3): 50 mg via ORAL
  Filled 2017-08-15 (×3): qty 1

## 2017-08-15 MED ORDER — DM-GUAIFENESIN ER 30-600 MG PO TB12
1.0000 | ORAL_TABLET | Freq: Two times a day (BID) | ORAL | Status: DC
  Administered 2017-08-15 – 2017-08-16 (×3): 1 via ORAL
  Filled 2017-08-15 (×4): qty 1

## 2017-08-15 MED ORDER — IPRATROPIUM-ALBUTEROL 0.5-2.5 (3) MG/3ML IN SOLN
3.0000 mL | Freq: Four times a day (QID) | RESPIRATORY_TRACT | Status: DC
  Administered 2017-08-15: 3 mL via RESPIRATORY_TRACT
  Filled 2017-08-15: qty 3

## 2017-08-15 MED ORDER — LORATADINE 10 MG PO TABS
10.0000 mg | ORAL_TABLET | Freq: Every day | ORAL | Status: DC
  Administered 2017-08-16 – 2017-08-17 (×2): 10 mg via ORAL
  Filled 2017-08-15 (×2): qty 1

## 2017-08-15 MED ORDER — DABIGATRAN ETEXILATE MESYLATE 150 MG PO CAPS
150.0000 mg | ORAL_CAPSULE | Freq: Two times a day (BID) | ORAL | Status: DC
  Administered 2017-08-15 – 2017-08-17 (×4): 150 mg via ORAL
  Filled 2017-08-15 (×5): qty 1

## 2017-08-15 MED ORDER — SODIUM CHLORIDE 0.9 % IV SOLN
500.0000 mg | INTRAVENOUS | Status: DC
  Administered 2017-08-16: 500 mg via INTRAVENOUS
  Filled 2017-08-15 (×2): qty 500

## 2017-08-15 MED ORDER — VITAMIN C 500 MG PO TABS
1000.0000 mg | ORAL_TABLET | Freq: Every day | ORAL | Status: DC
  Administered 2017-08-16 – 2017-08-17 (×2): 1000 mg via ORAL
  Filled 2017-08-15 (×2): qty 2

## 2017-08-15 MED ORDER — FAMOTIDINE 20 MG PO TABS
20.0000 mg | ORAL_TABLET | Freq: Every day | ORAL | Status: DC
  Administered 2017-08-15 – 2017-08-16 (×2): 20 mg via ORAL
  Filled 2017-08-15 (×2): qty 1

## 2017-08-15 MED ORDER — SODIUM CHLORIDE 0.9 % IV BOLUS
1000.0000 mL | Freq: Once | INTRAVENOUS | Status: AC
Start: 2017-08-15 — End: 2017-08-15
  Administered 2017-08-15: 1000 mL via INTRAVENOUS

## 2017-08-15 MED ORDER — SODIUM CHLORIDE 0.9 % IV SOLN
1.0000 g | Freq: Once | INTRAVENOUS | Status: AC
  Administered 2017-08-15: 1 g via INTRAVENOUS
  Filled 2017-08-15: qty 10

## 2017-08-15 MED ORDER — SIMVASTATIN 10 MG PO TABS
20.0000 mg | ORAL_TABLET | Freq: Every day | ORAL | Status: DC
  Administered 2017-08-16 – 2017-08-17 (×2): 20 mg via ORAL
  Filled 2017-08-15 (×2): qty 2

## 2017-08-15 NOTE — ED Notes (Signed)
Lochlann Mastrangelo (spouse) requests call when patient has bed assignment.   [id number]

## 2017-08-15 NOTE — ED Triage Notes (Signed)
Patient seen for same Saturday reports continued shortness of breath but states "I need something to dry up the mucus that is draining".  States that he cannot see PCP today.  Spouse reports she is concerned for dehydration.

## 2017-08-15 NOTE — H&P (Signed)
History and Physical    Walter Bryant IOE:[id number] DOB: Jun 25, 1930 DOA: 08/15/2017  PCP: Haywood Pao, MD  Patient coming from: Home  I have personally briefly reviewed patient's old medical records in [facility]  Chief Complaint: sob, worsening cough since one week.   HPI: Walter Bryant is a 84 y.o. male with medical history significant of chronic atrial fibrillation, hypertension, hyperlipidemia, AAA, BPH, anxiety and depression, polyneuropathy presents to [facility] for worsening sob, cough, unable to urinate over the las tone week. Patient reports his symptoms started last Tuesday with runny nose, nasal congestion, dry cough to clear productive cough, with some chills. He was seen by his PCP and was given a prescription for Z PACK. He did not improve and continued to worsen with dyspnea at rest, orthopnea, worsening pedal edema, worsening cough, generalized weakness. He underwent CXR in ED , was found to left sided pneumonia and transferred to [facility] for further evaluation. Lab work reveals sodium of 119, BNP of 486, EKG showing atrial fibrillation. Pt denies headache, dizziness, syncope, palpitations, chest pain, nausea, vomiting , abdomina pain, fever, hematochezia, hematemesis, dysuria.    Review of Systems: As per HPI otherwise 10 point review of systems negative.    Past Medical History:  Diagnosis Date  . A-fib ([place])   . AAA (abdominal aortic aneurysm) ([place])   . Abnormality of gait 12/20/2013  . Anxiety and depression   . Anxiety and depression   . Arthritis   . Barrett's esophagus   . BPH (benign prostatic hyperplasia)   . Degenerative arthritis   . GERD (gastroesophageal reflux disease)   . History of concussion   . HOH (hard of hearing)   . Hyperlipidemia   . Hypertension   . Left tibial fracture   . Macular degeneration Left  . Polyneuropathy in other diseases classified elsewhere ([place]) 12/20/2013  . Right clavicle fracture   . Spinal stenosis   . Transient  global amnesia   . Vertigo     Past Surgical History:  Procedure Laterality Date  . ABDOMINAL AORTIC ANEURYSM REPAIR  2016   stent placement  . APPENDECTOMY    . CATARACT EXTRACTION     Bilateral  . KNEE SURGERY Bilateral    both  knees replaced  . LAPAROSCOPIC RIGHT HEMI COLECTOMY    . LUMBAR LAMINECTOMY     L4   . REFRACTIVE SURGERY Left    macular degeneration  . TONSILLECTOMY AND ADENOIDECTOMY    . TOTAL HIP ARTHROPLASTY Right    x 2  . WRIST FRACTURE SURGERY Right      reports that he quit smoking about 51 years ago. He has never used smokeless tobacco. He reports that he does not drink alcohol or use drugs.  Allergies  Allergen Reactions  . Other     vicroyl sutures  . Morphine   . Oxycodone     Family History  Problem Relation Age of Onset  . Heart disease Sister        AFIB  . Neuropathy Neg Hx    Reviewed and not pertinent.   Prior to Admission medications   Medication Sig Start Date End Date Taking? Authorizing Provider  Ascorbic Acid (VITAMIN C) 1000 MG tablet Take 1,000 mg by mouth daily.   Yes [provider]  aspirin 81 MG tablet Take 81 mg by mouth at bedtime.    Yes [provider]  Calcium-Magnesium-Vitamin D (CALCIUM 500 PO) Take 1 tablet by mouth daily.  Yes [provider]  dabigatran (PRADAXA) 150 MG CAPS capsule Take 150 mg by mouth 2 (two) times daily.   Yes [provider]  fexofenadine (ALLEGRA) 180 MG tablet Take 180 mg by mouth every morning.   Yes [provider]  fluticasone (FLONASE) 50 MCG/ACT nasal spray Place 2 sprays into both nostrils daily for 7 days. 08/13/17 08/20/17 Yes Long, Wonda Olds, MD  ibuprofen (ADVIL,MOTRIN) 200 MG tablet Take by mouth. 600 mg each morning.   Yes [provider]  lisinopril (PRINIVIL,ZESTRIL) 5 MG tablet Take 5 mg by mouth daily after lunch.    Yes [provider]  LUTEIN PO Take 25 mg by mouth daily after lunch.    Yes [provider]   metoprolol succinate (TOPROL-XL) 50 MG 24 hr tablet Take 50 mg by mouth 2 (two) times daily. Take with or immediately following a meal.    Yes [provider]  Multiple Vitamin (MULTIVITAMIN) tablet Take 1 tablet by mouth daily.   Yes [provider]  Omega-3 Fatty Acids (OMEGA 3 PO) Take 1 tablet by mouth daily after lunch.    Yes [provider]  omeprazole (PRILOSEC) 20 MG capsule Take 20 mg by mouth every morning.    Yes [provider]  POTASSIUM PO Take 1 tablet by mouth daily after lunch.    Yes [provider]  ranitidine (ZANTAC) 150 MG tablet Take 150 mg by mouth 2 (two) times daily. Take in the afternoon and bedtime   Yes [provider]  Selenium 200 MCG TABS Take 1 tablet by mouth daily after lunch.    Yes [provider]  simvastatin (ZOCOR) 20 MG tablet Take 20 mg by mouth every morning.  01/09/13  Yes [provider]  sodium chloride (OCEAN) 0.65 % SOLN nasal spray Place 1 spray into both nostrils every morning.   Yes [provider]  tamsulosin (FLOMAX) 0.4 MG CAPS capsule Take 0.4 mg by mouth at bedtime.    Yes [provider]  zinc gluconate 50 MG tablet Take 50 mg by mouth every morning.    Yes [provider]  pregabalin (LYRICA) 75 MG capsule Take 75 mg by mouth 4 (four) times daily as needed.    [provider]    Physical Exam: Vitals:   08/15/17 1330 08/15/17 1400 08/15/17 1503 08/15/17 1614  BP: 121/75 121/76 94/81 (!) 141/83  Pulse: 68 63 68 70  Resp: (!) 21 18 (!) 23 (!) 22  Temp:    98.9 F (37.2 C)  TempSrc:    Oral  SpO2: 98% 96% 98% 96%    Constitutional: NAD, calm, comfortable Vitals:   08/15/17 1330 08/15/17 1400 08/15/17 1503 08/15/17 1614  BP: 121/75 121/76 94/81 (!) 141/83  Pulse: 68 63 68 70  Resp: (!) 21 18 (!) 23 (!) 22  Temp:    98.9 F (37.2 C)  TempSrc:    Oral  SpO2: 98% 96% 98% 96%   Eyes: PERRL, lids and conjunctivae  normal ENMT: Mucous membranes are moist. Posterior pharynx clear of any exudate or lesions.poor dentition.  Neck: normal, supple, no masses, no thyromegaly Respiratory: diminished air entry at bases. No wheezing or rhonchi.   Cardiovascular: irregular, no murmer, no JVD. 2+ pedal edema.  Abdomen: no tenderness, no masses palpated. No hepatosplenomegaly. Bowel sounds positive.  Musculoskeletal: 2+ pedal edema, no cyanosis or clubbing.  Skin: no rashes, lesions, ulcers. No induration Neurologic: CN 2-12 grossly intact. Sensation intact, Strength  5/5 in all 4.  Psychiatric: Normal judgment and insight. Alert and oriented x 3. Normal mood.     Labs on Admission: I have personally reviewed following labs and imaging studies  CBC: Recent Labs  Lab 08/15/17 1132  WBC 9.4  HGB 14.3  HCT 39.0  MCV 88.8  PLT 026   Basic Metabolic Panel: Recent Labs  Lab 08/15/17 1132  NA 119*  K 5.0  CL 88*  CO2 20*  GLUCOSE 109*  BUN 20  CREATININE 1.01  CALCIUM 8.5*   GFR: Estimated Creatinine Clearance: 64.1 mL/min (by C-G formula based on SCr of 1.01 mg/dL). Liver Function Tests: Recent Labs  Lab 08/15/17 1132  AST 36  ALT 24  ALKPHOS 76  BILITOT 2.0*  PROT 6.5  ALBUMIN 3.3*   No results for input(s): LIPASE, AMYLASE in the last 168 hours. No results for input(s): AMMONIA in the last 168 hours. Coagulation Profile: No results for input(s): INR, PROTIME in the last 168 hours. Cardiac Enzymes: Recent Labs  Lab 08/15/17 1132  TROPONINI <0.03   BNP (last 3 results) No results for input(s): PROBNP in the last 8760 hours. HbA1C: No results for input(s): HGBA1C in the last 72 hours. CBG: No results for input(s): GLUCAP in the last 168 hours. Lipid Profile: No results for input(s): CHOL, HDL, LDLCALC, TRIG, CHOLHDL, LDLDIRECT in the last 72 hours. Thyroid Function Tests: No results for input(s): TSH, T4TOTAL, FREET4, T3FREE, THYROIDAB in the last 72 hours. Anemia Panel: No  results for input(s): VITAMINB12, FOLATE, FERRITIN, TIBC, IRON, RETICCTPCT in the last 72 hours. Urine analysis: No results found for: COLORURINE, APPEARANCEUR, LABSPEC, PHURINE, GLUCOSEU, HGBUR, BILIRUBINUR, KETONESUR, PROTEINUR, UROBILINOGEN, NITRITE, LEUKOCYTESUR  Radiological Exams on Admission: Dg Chest 2 View  Result Date: 08/15/2017 CLINICAL DATA:  Persistent shortness of breath. Possible dehydration. EXAM: CHEST - 2 VIEW COMPARISON:  08/13/2017 radiographs. FINDINGS: The heart size and mediastinal contours are stable. There is aortic atherosclerosis. There is new patchy airspace disease at the left lung base. The right lung is clear. There is no pleural effusion or pneumothorax. Old right clavicle and rib fractures are noted. There are degenerative changes throughout the spine. IMPRESSION: New patchy left basilar airspace disease suspicious for early pneumonia. Followup PA and lateral chest X-ray is recommended in 3-4 weeks following trial of antibiotic therapy to ensure resolution and exclude underlying malignancy. Electronically Signed   By: Richardean Sale M.D.   On: 08/15/2017 11:34    EKG: Independently reviewed. Atrial fibrillation.   Assessment/Plan Active Problems:   CAP (community acquired pneumonia)   Failed outpatient treatment for CAP: Admit to telemetry and started the patient on IV antibiotics. Continue with IV rocephin and IV zithromax.  Blood cultures not done on admission.  Sputum cultures ordered.  Urine for strep antigen and legionella antigen ordered.  Currently not requiring oxygen.    Fluid over load, elevated bnp, orthopnea, Dyspnea:  Suspect CHF. NO previous echocardiogram on the chart.  Get echocardiogram. One dose of lasix ordered.  Re evaluate in am and repeat lasix as needed.  Strict intake and output.  Daily weights.  Get serial troponins.     Upper respiratory tract infection:  With post nasal drip and nasal congestion.  Use Claritin, flonase  and humidifier as needed.  mucinex and add codeine for cough suppression.    Hypertension: Well controlled.    Hyperlipidemia: resume home meds.    Hyponatremia: Pt appears fluid overloaded.  Use lasix and recheck sodium in am.  Rule out SIADH.  Get TSH, urine sodium and urine osmo, serum osmolarity.    Chronic atrial fibrillation:  Rate controlled. On pradaxa for anti coagulation.   Unable to urinate , probably from BPH:  Send UA and if abnormal get urine culture.       DVT prophylaxis: Pradaxa Code Status: full code.  Family Communication: wife at bedside.  Disposition Plan: pending resolution of respiratory symptoms.  Consults called: none.  Admission status: inpatient Nanda Quinton MD [facility] Pager [phone]  If 7PM-7AM, please contact night-coverage [email or web address] Password [facility]  08/15/2017, 6:44 PM

## 2017-08-15 NOTE — ED Provider Notes (Signed)
[facility] EMERGENCY DEPARTMENT Provider Note   CSN: [id number] Arrival date & time: 08/15/17  1037     History   Chief Complaint Chief Complaint  Patient presents with  . Shortness of Breath    HPI CRISTIAN DAVITT is a 84 y.o. male.  HPI Patient is an 84 year old male who was recently seen in the emergency department and diagnosed with bronchitis who presents back to the emergency department with ongoing cough and shortness of breath.  Wife reports that the melanite he was slightly confused and seems weak.  Wife is concerned about dehydration given his decreased oral intake over the past several days.  Patient has a history of permanent A. fib as well as a history of gastroesophageal reflux disease and abdominal aortic aneurysm.  He denies abdominal pain and denies back pain.  No chest pain.  Reports his cough remains productive.  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  He has relocated to [place] with his wife.  No history of DVT or pulmonary embolism.  No unilateral leg swelling.  Patient is on Pradaxa.     Past Medical History:  Diagnosis Date  . A-fib ([place])   . AAA (abdominal aortic aneurysm) ([place])   . Abnormality of gait 12/20/2013  . Anxiety and depression   . Anxiety and depression   . Arthritis   . Barrett's esophagus   . BPH (benign prostatic hyperplasia)   . Degenerative arthritis   . GERD (gastroesophageal reflux disease)   . History of concussion   . HOH (hard of hearing)   . Hyperlipidemia   . Hypertension   . Left tibial fracture   . Macular degeneration Left  . Polyneuropathy in other diseases classified elsewhere ([place]) 12/20/2013  . Right clavicle fracture   . Spinal stenosis   . Transient global amnesia   . Vertigo     Patient Active Problem List   Diagnosis Date Noted  . Polyneuropathy in other diseases classified elsewhere ([place]) 12/20/2013  . Abnormality of gait 12/20/2013  . Non-healing right groin open wound  04/09/2013    Past Surgical History:  Procedure Laterality Date  . ABDOMINAL AORTIC ANEURYSM REPAIR  2016   stent placement  . APPENDECTOMY    . CATARACT EXTRACTION     Bilateral  . KNEE SURGERY Bilateral    both  knees replaced  . LAPAROSCOPIC RIGHT HEMI COLECTOMY    . LUMBAR LAMINECTOMY     L4   . REFRACTIVE SURGERY Left    macular degeneration  . TONSILLECTOMY AND ADENOIDECTOMY    . TOTAL HIP ARTHROPLASTY Right    x 2  . WRIST FRACTURE SURGERY Right         Home Medications    Prior to Admission medications   Medication Sig Start Date End Date Taking? Authorizing Provider  Ascorbic Acid (VITAMIN C) 1000 MG tablet Take 1,000 mg by mouth daily.    [provider]  aspirin 81 MG tablet Take 81 mg by mouth daily.    [provider]  Calcium-Magnesium-Vitamin D (CALCIUM 500 PO) Take 1 tablet by mouth daily.    [provider]  dabigatran (PRADAXA) 150 MG CAPS capsule Take 150 mg by mouth 2 (two) times daily.    [provider]  fluticasone (FLONASE) 50 MCG/ACT nasal spray Place 2 sprays into both nostrils daily for 7 days. 08/13/17 08/20/17  Long, Wonda Olds, MD  ibuprofen (ADVIL,MOTRIN) 200 MG tablet Take by mouth. [place]  mg each morning.    [provider]  lisinopril (PRINIVIL,ZESTRIL) 5 MG tablet Take 5 mg by mouth daily.    [provider]  LUTEIN PO Take 1 tablet by mouth 2 (two) times daily.    [provider]  metoprolol succinate (TOPROL-XL) 50 MG 24 hr tablet Take 50 mg by mouth daily. Take with or immediately following a meal.    [provider]  Multiple Vitamin (MULTIVITAMIN) tablet Take 1 tablet by mouth daily.    [provider]  Omega-3 Fatty Acids (OMEGA 3 PO) Take 1 tablet by mouth daily.    [provider]  omeprazole (PRILOSEC) 20 MG capsule Take 20 mg by mouth daily.    [provider]  POTASSIUM PO Take 1 tablet by mouth daily.    [provider]    pregabalin (LYRICA) 75 MG capsule Take 75 mg by mouth 4 (four) times daily as needed.    [provider]  ranitidine (ZANTAC) 150 MG tablet Take 150 mg by mouth 2 (two) times daily.    [provider]  Selenium 200 MCG TABS Take 1 tablet by mouth daily.    [provider]  simvastatin (ZOCOR) 20 MG tablet Take 20 mg by mouth daily.  01/09/13   [provider]  tamsulosin (FLOMAX) 0.4 MG CAPS capsule Take 0.4 mg by mouth daily.     [provider]  zinc gluconate 50 MG tablet Take 50 mg by mouth daily.    [provider]    Family History Family History  Problem Relation Age of Onset  . Heart disease Sister        AFIB  . Neuropathy Neg Hx     Social History Social History   Tobacco Use  . Smoking status: Former Smoker    Last attempt to quit: 04/19/1966    Years since quitting: 51.3  . Smokeless tobacco: Never Used  Substance Use Topics  . Alcohol use: No  . Drug use: No     Allergies   Other; Morphine; and Oxycodone   Review of Systems Review of Systems  All other systems reviewed and are negative.    Physical Exam Updated Vital Signs BP 108/61   Pulse (!) 40   Temp 98.1 F (36.7 C) (Oral)   Resp (!) 21   SpO2 91%   Physical Exam  Constitutional: He is oriented to person, place, and time. He appears well-developed and well-nourished.  HENT:  Head: Normocephalic and atraumatic.  Eyes: EOM are normal.  Neck: Normal range of motion.  Cardiovascular: Normal rate, regular rhythm, normal heart sounds and intact distal pulses.  Pulmonary/Chest: Effort normal and breath sounds normal. No respiratory distress.  Bilateral lower lung field wheeze without significant rhonchi.  Abdominal: Soft. He exhibits no distension. There is no tenderness.  Musculoskeletal: Normal range of motion. He exhibits no edema or tenderness.  Neurological: He is alert and oriented to person, place, and time.  Skin: Skin is warm and dry.   Psychiatric: He has a normal mood and affect. Judgment normal.  Nursing note and vitals reviewed.    ED Treatments / Results  Labs (all labs ordered are listed, but only abnormal results are displayed) Labs Reviewed  CBC - Abnormal; Notable for the following components:      Result Value   MCHC 36.7 (*)    All other components within normal limits  COMPREHENSIVE METABOLIC PANEL - Abnormal; Notable for the following components:  Sodium 119 (*)    Chloride 88 (*)    CO2 20 (*)    Glucose, Bld 109 (*)    Calcium 8.5 (*)    Albumin 3.3 (*)    Total Bilirubin 2.0 (*)    All other components within normal limits  BRAIN NATRIURETIC PEPTIDE - Abnormal; Notable for the following components:   B Natriuretic Peptide 486.8 (*)    All other components within normal limits  TROPONIN I   Sodium  Date Value Ref Range Status  08/15/2017 119 (LL) 135 - 145 mmol/L Final    Comment:    CRITICAL RESULT CALLED TO, READ BACK BY AND VERIFIED WITH: AMY HARTLEY,RN AT 1222 08/15/17 BY K BARR     EKG EKG Interpretation  Date/Time:  Monday August 15 2017 11:22:15 EDT Ventricular Rate:  60 PR Interval:    QRS Duration: 106 QT Interval:  437 QTC Calculation: 437 R Axis:   44 Text Interpretation:  Atrial fibrillation Low voltage, precordial leads No old tracing to compare Confirmed by Jola Schmidt [phone]) on 08/15/2017 12:38:40 PM   Radiology Dg Chest 2 View  Result Date: 08/15/2017 CLINICAL DATA:  Persistent shortness of breath. Possible dehydration. EXAM: CHEST - 2 VIEW COMPARISON:  08/13/2017 radiographs. FINDINGS: The heart size and mediastinal contours are stable. There is aortic atherosclerosis. There is new patchy airspace disease at the left lung base. The right lung is clear. There is no pleural effusion or pneumothorax. Old right clavicle and rib fractures are noted. There are degenerative changes throughout the spine. IMPRESSION: New patchy left basilar airspace disease suspicious for  early pneumonia. Followup PA and lateral chest X-ray is recommended in 3-4 weeks following trial of antibiotic therapy to ensure resolution and exclude underlying malignancy. Electronically Signed   By: Richardean Sale M.D.   On: 08/15/2017 11:34   Dg Chest 2 View  Result Date: 08/13/2017 CLINICAL DATA:  Upper respiratory infection. Shortness of breath. No fever. EXAM: CHEST - 2 VIEW COMPARISON:  None. FINDINGS: The heart size is borderline. The hila and mediastinum are normal. No pneumothorax. No nodules or masses. No focal infiltrates identified. IMPRESSION: No active cardiopulmonary disease. Electronically Signed   By: Dorise Bullion III M.D   On: 08/13/2017 17:53    Procedures .Critical Care Performed by: Jola Schmidt, MD Authorized by: Jola Schmidt, MD     CRITICAL CARE Performed by: Jola Schmidt Total critical care time: 31 minutes Critical care time was exclusive of separately billable procedures and treating other patients. Critical care was necessary to treat or prevent imminent or life-threatening deterioration. Critical care was time spent personally by me on the following activities: development of treatment plan with patient and/or surrogate as well as nursing, discussions with consultants, evaluation of patient's response to treatment, examination of patient, obtaining history from patient or surrogate, ordering and performing treatments and interventions, ordering and review of laboratory studies, ordering and review of radiographic studies, pulse oximetry and re-evaluation of patient's condition.   Medications Ordered in ED Medications  ipratropium-albuterol (DUONEB) 0.5-2.5 (3) MG/3ML nebulizer solution 3 mL (3 mLs Nebulization Given 08/15/17 1125)  azithromycin (ZITHROMAX) 500 mg in sodium chloride 0.9 % 250 mL IVPB (500 mg Intravenous New Bag/Given 08/15/17 1227)  sodium chloride 0.9 % bolus 1,000 mL (1,000 mLs Intravenous New Bag/Given 08/15/17 1209)  azithromycin  (ZITHROMAX) 500 MG injection (has no administration in time range)  cefTRIAXone (ROCEPHIN) 1 g in sodium chloride 0.9 % 100 mL IVPB (0 g Intravenous Stopped 08/15/17 1228)  Initial Impression / Assessment and Plan / ED Course  I have reviewed the triage vital signs and the nursing notes.  Pertinent labs & imaging results that were available during my care of the patient were reviewed by me and considered in my medical decision making (see chart for details).     Patient presents clinically with what sounds like developing pneumonia despite negative x-ray the other day.  Mild tachypnea.  Afebrile in the emergency department.  Will obtain labs and repeat chest x-ray.  Likely needs IV fluids for dehydration  12:39 PM I personally reviewed the patient's x-ray.  He has developing left lower lobe pneumonia seen both on AP as well as lateral view.  Patient with sodium of 119 likely secondary to profound dehydration.  IV fluids now and admission the hospital given his profound hyponatremia.  Patient does have community-acquired pneumonia.  He has been given IV Rocephin and azithromycin.  He had not completed his course of azithromycin thus I do not believe he is truly failed outpatient therapy at this time.  Given his presentation this still would represent community acquired pneumonia in terms of bacterial causes.  Consultations: [facility]  Disposition: Transfer/admission  Patient and family updated.  Final Clinical Impressions(s) / ED Diagnoses   Final diagnoses:  Community acquired pneumonia of left lower lobe of lung ([place])  Hyponatremia    ED Discharge Orders    None       Jola Schmidt, MD 08/15/17 1241

## 2017-08-15 NOTE — ED Notes (Signed)
ED Provider at bedside. 

## 2017-08-16 ENCOUNTER — Other Ambulatory Visit (HOSPITAL_COMMUNITY): Payer: Medicare Other

## 2017-08-16 DIAGNOSIS — F411 Generalized anxiety disorder: Secondary | ICD-10-CM

## 2017-08-16 DIAGNOSIS — E8770 Fluid overload, unspecified: Secondary | ICD-10-CM | POA: Diagnosis present

## 2017-08-16 DIAGNOSIS — G63 Polyneuropathy in diseases classified elsewhere: Secondary | ICD-10-CM

## 2017-08-16 LAB — CBC
HCT: 40.3 % (ref 39.0–52.0)
Hemoglobin: 14.2 g/dL (ref 13.0–17.0)
MCH: 31.7 pg (ref 26.0–34.0)
MCHC: 35.2 g/dL (ref 30.0–36.0)
MCV: 90 fL (ref 78.0–100.0)
PLATELETS: 181 10*3/uL (ref 150–400)
RBC: 4.48 MIL/uL (ref 4.22–5.81)
RDW: 13.6 % (ref 11.5–15.5)
WBC: 10.8 10*3/uL — AB (ref 4.0–10.5)

## 2017-08-16 LAB — HEMOGLOBIN A1C
Hgb A1c MFr Bld: 5.3 % (ref 4.8–5.6)
Mean Plasma Glucose: 105.41 mg/dL

## 2017-08-16 LAB — URINALYSIS, ROUTINE W REFLEX MICROSCOPIC
BACTERIA UA: NONE SEEN
Bilirubin Urine: NEGATIVE
Glucose, UA: NEGATIVE mg/dL
KETONES UR: 5 mg/dL — AB
Leukocytes, UA: NEGATIVE
Nitrite: NEGATIVE
PROTEIN: NEGATIVE mg/dL
Specific Gravity, Urine: 1.003 — ABNORMAL LOW (ref 1.005–1.030)
pH: 5 (ref 5.0–8.0)

## 2017-08-16 LAB — OSMOLALITY, URINE: Osmolality, Ur: 218 mOsm/kg — ABNORMAL LOW (ref 300–900)

## 2017-08-16 LAB — BASIC METABOLIC PANEL
Anion gap: 10 (ref 5–15)
BUN: 14 mg/dL (ref 6–20)
CALCIUM: 9 mg/dL (ref 8.9–10.3)
CHLORIDE: 92 mmol/L — AB (ref 101–111)
CO2: 24 mmol/L (ref 22–32)
CREATININE: 0.83 mg/dL (ref 0.61–1.24)
Glucose, Bld: 115 mg/dL — ABNORMAL HIGH (ref 65–99)
Potassium: 4.5 mmol/L (ref 3.5–5.1)
Sodium: 126 mmol/L — ABNORMAL LOW (ref 135–145)

## 2017-08-16 LAB — CORTISOL: Cortisol, Plasma: 20.8 ug/dL

## 2017-08-16 LAB — OSMOLALITY: OSMOLALITY: 264 mosm/kg — AB (ref 275–295)

## 2017-08-16 LAB — TROPONIN I: Troponin I: <0.03 ng/mL (ref <0.03)

## 2017-08-16 LAB — TSH: TSH: 0.639 u[IU]/mL (ref 0.350–4.500)

## 2017-08-16 LAB — SODIUM, URINE, RANDOM: SODIUM UR: 73 mmol/L

## 2017-08-16 LAB — STREP PNEUMONIAE URINARY ANTIGEN: STREP PNEUMO URINARY ANTIGEN: NEGATIVE

## 2017-08-16 MED ORDER — GUAIFENESIN-CODEINE 100-10 MG/5ML PO SOLN
5.0000 mL | Freq: Four times a day (QID) | ORAL | Status: DC | PRN
Start: 2017-08-16 — End: 2017-08-17
  Filled 2017-08-16: qty 5

## 2017-08-16 MED ORDER — FUROSEMIDE 20 MG PO TABS
20.0000 mg | ORAL_TABLET | Freq: Every day | ORAL | Status: DC
  Administered 2017-08-16 – 2017-08-17 (×2): 20 mg via ORAL
  Filled 2017-08-16 (×2): qty 1

## 2017-08-16 MED ORDER — GUAIFENESIN-DM 100-10 MG/5ML PO SYRP
5.0000 mL | ORAL_SOLUTION | ORAL | Status: DC | PRN
  Administered 2017-08-16 (×2): 5 mL via ORAL
  Filled 2017-08-16 (×2): qty 10

## 2017-08-16 MED ORDER — PREGABALIN 75 MG PO CAPS
75.0000 mg | ORAL_CAPSULE | Freq: Every day | ORAL | Status: DC | PRN
Start: 2017-08-16 — End: 2017-08-17

## 2017-08-16 MED ORDER — ALPRAZOLAM 0.25 MG PO TABS
0.2500 mg | ORAL_TABLET | Freq: Every day | ORAL | Status: DC | PRN
  Administered 2017-08-16 – 2017-08-17 (×2): 0.25 mg via ORAL
  Filled 2017-08-16 (×2): qty 1

## 2017-08-16 NOTE — Progress Notes (Signed)
PROGRESS NOTE    Rolondo Pierre Nelles  JKK:[id number] DOB: 1931-01-07 DOA: 08/15/2017 PCP: Haywood Pao, MD   Brief Narrative:  Walter Bryant is a 84 y.o. male with medical history significant of chronic atrial fibrillation, hypertension, hyperlipidemia, AAA, BPH, anxiety and depression, polyneuropathy presents to [facility] for worsening sob, cough, unable to urinate over the for one week. Patient reports his symptoms started last Tuesday with runny nose, nasal congestion, dry cough to clear productive cough, with some chills. He was seen by his PCP and was given a prescription for Z PACK. He did not improve and continued to worsen with dyspnea at rest, orthopnea, worsening pedal edema, worsening cough, generalized weakness. He underwent CXR in ED , was found to left sided pneumonia and transferred to [facility] for further evaluation     Assessment & Plan:   Active Problems:   Polyneuropathy in other diseases classified elsewhere ([place])   CAP (community acquired pneumonia)   Fluid overload   Anxiety   Acute respiratory failure with hypoxia secondary to failed outpatient treatment for bronchitis , CAP and a component of fluid overload.  Improved .  Diuresed about 2500 ml urine overnight and another 1000 ml today.  Continue with low dose lasix oral 20 mg daily.  Echocardiogram pending.  troponins negative.  Continue with strict intake and output and daily weights.  Meanwhile continue with IV antibiotics for the CAP. Left sided opacity.  Urine for strep pneumonia negative.  Legionella anti gen pending.    Anxiety: Prn xanax.    Hyponatremia: improved with diuresis.  Continue to monitor.    Polyneuropathy Continue with lyrica.    Hypertension;  Well controlled.    Chronic atrial fibrillation:  Rate controlled and on pradaxa for anti coagulation.     Hyperlipidemia:      DVT prophylaxis: pradaxa  Code Status: full code.  Family Communication: wife at bedside.    Disposition Plan: pending resolution of the respiratory symptoms.    Consultants:   None.   Procedures: echocardiogram  Pending.  Antimicrobials:rocephin and zithromax.   Subjective: Reports feeling anxious and sob. Feels better after [place] oxygen.   Objective: Vitals:   08/15/17 2254 08/16/17 0635 08/16/17 1048 08/16/17 1445  BP: 132/78 121/61 112/68 131/75  Pulse: 73 73 80 71  Resp: (!) 22 20 18    Temp: 98 F (36.7 C) 98.1 F (36.7 C) 98.4 F (36.9 C) 98.6 F (37 C)  TempSrc: Oral  Oral Oral  SpO2: 95% 96% 95% 97%    Intake/Output Summary (Last 24 hours) at 08/16/2017 1647 Last data filed at 08/16/2017 1335 Gross per 24 hour  Intake 840 ml  Output 3125 ml  Net -2285 ml   There were no vitals filed for this visit.  Examination:  General exam: Appears calm not in distress.  Respiratory system: Clear to auscultation. Respiratory effort normal. Cardiovascular system: S1 & S2 heard, RRR. No JVD, murmurs, rubs, gallops or clicks.  Gastrointestinal system: Abdomen is nondistended, soft and nontender. No organomegaly or masses felt. Normal bowel sounds heard. Central nervous system: Alert and oriented. No focal neurological deficits. Extremities: Symmetric 5 x 5 power. Pedal edema resolved.  Skin: No rashes, lesions or ulcers Psychiatry: Judgement and insight appear normal. Mood & affect appropriate.     Data Reviewed: I have personally reviewed following labs and imaging studies  CBC: Recent Labs  Lab 08/15/17 1132 08/16/17 0651  WBC 9.4 10.8*  HGB 14.3 14.2  HCT 39.0 40.3  MCV 88.8  90.0  PLT 170 509   Basic Metabolic Panel: Recent Labs  Lab 08/15/17 1132 08/15/17 1940 08/16/17 0651  NA 119* 121* 126*  K 5.0  --  4.5  CL 88*  --  92*  CO2 20*  --  24  GLUCOSE 109*  --  115*  BUN 20  --  14  CREATININE 1.01  --  0.83  CALCIUM 8.5*  --  9.0   GFR: Estimated Creatinine Clearance: 78.1 mL/min (by C-G formula based on SCr of 0.83 mg/dL). Liver  Function Tests: Recent Labs  Lab 08/15/17 1132  AST 36  ALT 24  ALKPHOS 76  BILITOT 2.0*  PROT 6.5  ALBUMIN 3.3*   No results for input(s): LIPASE, AMYLASE in the last 168 hours. No results for input(s): AMMONIA in the last 168 hours. Coagulation Profile: No results for input(s): INR, PROTIME in the last 168 hours. Cardiac Enzymes: Recent Labs  Lab 08/15/17 1132 08/15/17 2337 08/16/17 0651 08/16/17 1055  TROPONINI <0.03 <0.03 <0.03 <0.03   BNP (last 3 results) No results for input(s): PROBNP in the last 8760 hours. HbA1C: Recent Labs    08/16/17 0651  HGBA1C 5.3   CBG: No results for input(s): GLUCAP in the last 168 hours. Lipid Profile: No results for input(s): CHOL, HDL, LDLCALC, TRIG, CHOLHDL, LDLDIRECT in the last 72 hours. Thyroid Function Tests: Recent Labs    08/16/17 0651  TSH 0.639   Anemia Panel: No results for input(s): VITAMINB12, FOLATE, FERRITIN, TIBC, IRON, RETICCTPCT in the last 72 hours. Sepsis Labs: No results for input(s): PROCALCITON, LATICACIDVEN in the last 168 hours.  No results found for this or any previous visit (from the past 240 hour(s)).       Radiology Studies: Dg Chest 2 View  Result Date: 08/15/2017 CLINICAL DATA:  Persistent shortness of breath. Possible dehydration. EXAM: CHEST - 2 VIEW COMPARISON:  08/13/2017 radiographs. FINDINGS: The heart size and mediastinal contours are stable. There is aortic atherosclerosis. There is new patchy airspace disease at the left lung base. The right lung is clear. There is no pleural effusion or pneumothorax. Old right clavicle and rib fractures are noted. There are degenerative changes throughout the spine. IMPRESSION: New patchy left basilar airspace disease suspicious for early pneumonia. Followup PA and lateral chest X-ray is recommended in 3-4 weeks following trial of antibiotic therapy to ensure resolution and exclude underlying malignancy. Electronically Signed   By: Richardean Sale  M.D.   On: 08/15/2017 11:34        Scheduled Meds: . aspirin  81 mg Oral QHS  . dabigatran  150 mg Oral BID  . dextromethorphan-guaiFENesin  1 tablet Oral BID  . famotidine  20 mg Oral QHS  . fluticasone  2 spray Each Nare Daily  . furosemide  20 mg Oral Daily  . loratadine  10 mg Oral Daily  . metoprolol succinate  50 mg Oral BID  . pantoprazole  40 mg Oral Daily  . simvastatin  20 mg Oral Daily  . tamsulosin  0.4 mg Oral QHS  . vitamin C  1,000 mg Oral Daily  . zinc sulfate  220 mg Oral Daily   Continuous Infusions: . azithromycin 500 mg (08/16/17 1540)     LOS: 1 day    Time spent: 35 minutes.     Hosie Poisson, MD [facility] Pager [phone]  If 7PM-7AM, please contact night-coverage [email or web address] Password [facility] 08/16/2017, 4:47 PM

## 2017-08-16 NOTE — Progress Notes (Signed)
Patient complain of "not feeling right" & feeling dizzy while sitting in chair.  Vitals: 112/68, HR 80, Temp 98.4, O2 98% on room air.  MD notified via text page.

## 2017-08-16 NOTE — Progress Notes (Signed)
Patient and family refused guaifenesin w/ codeine d/t allergy.  Patient stated the oxygen helps him feel better.  MD notified via text page.

## 2017-08-17 ENCOUNTER — Inpatient Hospital Stay (HOSPITAL_COMMUNITY): Payer: Medicare Other

## 2017-08-17 DIAGNOSIS — J181 Lobar pneumonia, unspecified organism: Principal | ICD-10-CM

## 2017-08-17 DIAGNOSIS — I340 Nonrheumatic mitral (valve) insufficiency: Secondary | ICD-10-CM

## 2017-08-17 DIAGNOSIS — E871 Hypo-osmolality and hyponatremia: Secondary | ICD-10-CM

## 2017-08-17 DIAGNOSIS — E8779 Other fluid overload: Secondary | ICD-10-CM

## 2017-08-17 LAB — ECHOCARDIOGRAM COMPLETE: Weight: 3436.8 oz

## 2017-08-17 LAB — LEGIONELLA PNEUMOPHILA SEROGP 1 UR AG: L. PNEUMOPHILA SEROGP 1 UR AG: NEGATIVE

## 2017-08-17 MED ORDER — ALPRAZOLAM 0.25 MG PO TABS: 0.2500 mg | ORAL_TABLET | Freq: Every day | ORAL | 0 refills | Status: DC | PRN

## 2017-08-17 MED ORDER — AZITHROMYCIN 250 MG PO TABS: 250.0000 mg | ORAL_TABLET | Freq: Every day | ORAL | 0 refills | Status: AC

## 2017-08-17 MED ORDER — AZITHROMYCIN 250 MG PO TABS
500.0000 mg | ORAL_TABLET | Freq: Every day | ORAL | Status: DC
  Administered 2017-08-17: 500 mg via ORAL
  Filled 2017-08-17: qty 2

## 2017-08-17 MED ORDER — FUROSEMIDE 20 MG PO TABS: 20.0000 mg | ORAL_TABLET | Freq: Every day | ORAL | 0 refills | Status: DC

## 2017-08-17 NOTE — Progress Notes (Signed)
  Echocardiogram 2D Echocardiogram has been performed.  Walter Bryant 08/17/2017, 10:43 AM

## 2017-08-17 NOTE — Discharge Summary (Signed)
Physician Discharge Summary  Brahm Barbeau [facility] WVP:[id number] DOB: March 20, 1931 DOA: 08/15/2017  PCP: Haywood Pao, MD  Admit date: 08/15/2017 Discharge date: 08/17/2017  Time spent: 35 minutes  Recommendations for Outpatient Follow-up:  1. Follow up with PCP   Discharge Diagnoses:  Principal Problem:   CAP (community acquired pneumonia) Active Problems:   Polyneuropathy in other diseases classified elsewhere [facility])   Fluid overload   Anxiety   Hyponatremia   Discharge Condition: Good  Diet recommendation: Cardiac  Filed Weights   08/17/17 0520  Weight: 97.4 kg (214 lb 12.8 oz)    History of present illness:  Walter Bryant is a 84 y.o. male with medical history significant of chronic atrial fibrillation, hypertension, hyperlipidemia, AAA, BPH, anxiety and depression, polyneuropathy presents to [facility] for worsening sob, cough, unable to urinate over the las tone week. Patient reports his symptoms started last Tuesday with runny nose, nasal congestion, dry cough to clear productive cough, with some chills. He was seen by his PCP and was given a prescription for Z PACK. He did not improve and continued to worsen with dyspnea at rest, orthopnea, worsening pedal edema, worsening cough, generalized weakness. He underwent CXR in ED , was found to left sided pneumonia and transferred to [facility] for further evaluation. Lab work reveals sodium of 119, BNP of 486, EKG showing atrial fibrillation. Pt denies headache, dizziness, syncope, palpitations, chest pain, nausea, vomiting , abdomina pain, fever, hematochezia, hematemesis, dysuria   Hospital Course:  Patient admitted and started on IV Rocephin and zithromax. He had bilateral lower extremity edema and suspected congestive heart failure.  Patient initiated on IV Lasix later oral Lasix.  His edema has tremendously improved.  He was being discharged on 20 mg of Lasix daily for the first week to follow-up with PCP.  Also transitioned to oral  Zithromax.  He has done very well on room air without hypoxia.  His lung exam was much better at time of discharge.  Patient has significant anxiety and panic attacks.  He responded to alprazolam in the hospital.  He is under tremendous stress at home.  I'm giving him a prescription for home use.  Procedures:  Echocardiogram: EF 55-60%, left ventricular hypertrophy, no valvular abnormalities  Consultations:  none  Discharge Exam: Vitals:   08/17/17 0520 08/17/17 1434  BP: 136/69 (!) 146/85  Pulse: 71 70  Resp: 20 (!) 24  Temp: 97.7 F (36.5 C)   SpO2: 98% 96%    General: stable with no acute distress Cardiovascular: regular rate and rhythm Respiratory: good air entry bilaterally with some mild basal crackles  Discharge Instructions   Discharge Instructions    Diet - low sodium heart healthy   Complete by:  As directed    Increase activity slowly   Complete by:  As directed      Allergies as of 08/17/2017      Reactions   Other    vicroyl sutures   Morphine    Oxycodone       Medication List    TAKE these medications   ALPRAZolam 0.25 MG tablet Commonly known as:  XANAX Take 1 tablet (0.25 mg total) by mouth daily as needed for anxiety.   aspirin 81 MG tablet Take 81 mg by mouth at bedtime.   azithromycin 250 MG tablet Commonly known as:  ZITHROMAX Take 1 tablet (250 mg total) by mouth daily for 7 days.   CALCIUM 500 PO Take 1 tablet by mouth daily.   fexofenadine  180 MG tablet Commonly known as:  ALLEGRA Take 180 mg by mouth every morning.   fluticasone 50 MCG/ACT nasal spray Commonly known as:  FLONASE Place 2 sprays into both nostrils daily for 7 days.   furosemide 20 MG tablet Commonly known as:  LASIX Take 1 tablet (20 mg total) by mouth daily.   ibuprofen 200 MG tablet Commonly known as:  ADVIL,MOTRIN Take by mouth. 600 mg each morning.   lisinopril 5 MG tablet Commonly known as:  PRINIVIL,ZESTRIL Take 5 mg by mouth daily after lunch.    LUTEIN PO Take 25 mg by mouth daily after lunch.   metoprolol succinate 50 MG 24 hr tablet Commonly known as:  TOPROL-XL Take 50 mg by mouth 2 (two) times daily. Take with or immediately following a meal.   multivitamin tablet Take 1 tablet by mouth daily.   OMEGA 3 PO Take 1 tablet by mouth daily after lunch.   omeprazole 20 MG capsule Commonly known as:  PRILOSEC Take 20 mg by mouth every morning.   POTASSIUM PO Take 1 tablet by mouth daily after lunch.   PRADAXA 150 MG Caps capsule Generic drug:  dabigatran Take 150 mg by mouth 2 (two) times daily.   pregabalin 75 MG capsule Commonly known as:  LYRICA Take 75 mg by mouth 4 (four) times daily as needed.   ranitidine 150 MG tablet Commonly known as:  ZANTAC Take 150 mg by mouth 2 (two) times daily. Take in the afternoon and bedtime   Selenium 200 MCG Tabs Take 1 tablet by mouth daily after lunch.   simvastatin 20 MG tablet Commonly known as:  ZOCOR Take 20 mg by mouth every morning.   sodium chloride 0.65 % Soln nasal spray Commonly known as:  OCEAN Place 1 spray into both nostrils every morning.   tamsulosin 0.4 MG Caps capsule Commonly known as:  FLOMAX Take 0.4 mg by mouth at bedtime.   vitamin C 1000 MG tablet Take 1,000 mg by mouth daily.   zinc gluconate 50 MG tablet Take 50 mg by mouth every morning.      Allergies  Allergen Reactions  . Other     vicroyl sutures  . Morphine   . Oxycodone    Follow-up Information    Tisovec, Fransico Him, MD.   Specialty:  Internal Medicine Contact information: [address] [place] [address] [phone]            The results of significant diagnostics from this hospitalization (including imaging, microbiology, ancillary and laboratory) are listed below for reference.    Significant Diagnostic Studies: Dg Chest 2 View  Result Date: 08/15/2017 CLINICAL DATA:  Persistent shortness of breath. Possible dehydration. EXAM: CHEST - 2 VIEW  COMPARISON:  08/13/2017 radiographs. FINDINGS: The heart size and mediastinal contours are stable. There is aortic atherosclerosis. There is new patchy airspace disease at the left lung base. The right lung is clear. There is no pleural effusion or pneumothorax. Old right clavicle and rib fractures are noted. There are degenerative changes throughout the spine. IMPRESSION: New patchy left basilar airspace disease suspicious for early pneumonia. Followup PA and lateral chest X-ray is recommended in 3-4 weeks following trial of antibiotic therapy to ensure resolution and exclude underlying malignancy. Electronically Signed   By: Richardean Sale M.D.   On: 08/15/2017 11:34   Dg Chest 2 View  Result Date: 08/13/2017 CLINICAL DATA:  Upper respiratory infection. Shortness of breath. No fever. EXAM: CHEST - 2 VIEW COMPARISON:  None. FINDINGS: The heart size is borderline. The hila and mediastinum are normal. No pneumothorax. No nodules or masses. No focal infiltrates identified. IMPRESSION: No active cardiopulmonary disease. Electronically Signed   By: Dorise Bullion III M.D   On: 08/13/2017 17:53    Microbiology: No results found for this or any previous visit (from the past 240 hour(s)).   Labs: Basic Metabolic Panel: Recent Labs  Lab 08/15/17 1132 08/15/17 1940 08/16/17 0651  NA 119* 121* 126*  K 5.0  --  4.5  CL 88*  --  92*  CO2 20*  --  24  GLUCOSE 109*  --  115*  BUN 20  --  14  CREATININE 1.01  --  0.83  CALCIUM 8.5*  --  9.0   Liver Function Tests: Recent Labs  Lab 08/15/17 1132  AST 36  ALT 24  ALKPHOS 76  BILITOT 2.0*  PROT 6.5  ALBUMIN 3.3*   No results for input(s): LIPASE, AMYLASE in the last 168 hours. No results for input(s): AMMONIA in the last 168 hours. CBC: Recent Labs  Lab 08/15/17 1132 08/16/17 0651  WBC 9.4 10.8*  HGB 14.3 14.2  HCT 39.0 40.3  MCV 88.8 90.0  PLT 170 181   Cardiac Enzymes: Recent Labs  Lab 08/15/17 1132 08/15/17 2337  08/16/17 0651 08/16/17 1055  TROPONINI <0.03 <0.03 <0.03 <0.03   BNP: BNP (last 3 results) Recent Labs    08/15/17 1132  BNP 486.8*    ProBNP (last 3 results) No results for input(s): PROBNP in the last 8760 hours.  CBG: No results for input(s): GLUCAP in the last 168 hours.     SignedBarbette Merino MD.  [facility] 08/17/2017, 3:33 PM
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
## 2017-08-17 NOTE — Progress Notes (Signed)
Discharge instructions and medications discussed with patient.  AVS and prescription given to patient.  All questions answered.  

## 2017-08-17 NOTE — Progress Notes (Signed)
PHARMACIST - PHYSICIAN COMMUNICATION DR:   Jonelle Sidle CONCERNING: Antibiotic IV to Oral Route Change Policy  RECOMMENDATION: This patient is receiving azithromycin by the intravenous route.  Based on criteria approved by the Pharmacy and Therapeutics Committee, the antibiotic(s) is/are being converted to the equivalent oral dose form(s).   DESCRIPTION: These criteria include:  Patient being treated for a respiratory tract infection, urinary tract infection, cellulitis or clostridium difficile associated diarrhea if on metronidazole  The patient is not neutropenic and does not exhibit a GI malabsorption state  The patient is eating (either orally or via tube) and/or has been taking other orally administered medications for a least 24 hours  The patient is improving clinically and has a Tmax < 100.5  If you have questions about this conversion, please contact the Pharmacy Department  []   [phone] )  [facility] []   [phone] )  [facility] []   [phone] )  [facility] []   [phone] )  [facility] [x]   [phone] )  [place], PharmD, BCPS 08/17/2017 8:10 AM

## 2017-08-23 DIAGNOSIS — Z7901 Long term (current) use of anticoagulants: Secondary | ICD-10-CM | POA: Diagnosis not present

## 2017-08-23 DIAGNOSIS — E871 Hypo-osmolality and hyponatremia: Secondary | ICD-10-CM | POA: Diagnosis not present

## 2017-08-23 DIAGNOSIS — R600 Localized edema: Secondary | ICD-10-CM | POA: Diagnosis not present

## 2017-08-23 DIAGNOSIS — Z6829 Body mass index (BMI) 29.0-29.9, adult: Secondary | ICD-10-CM | POA: Diagnosis not present

## 2017-08-23 DIAGNOSIS — R609 Edema, unspecified: Secondary | ICD-10-CM | POA: Diagnosis not present

## 2017-08-23 DIAGNOSIS — J181 Lobar pneumonia, unspecified organism: Secondary | ICD-10-CM | POA: Diagnosis not present

## 2017-09-06 DIAGNOSIS — J181 Lobar pneumonia, unspecified organism: Secondary | ICD-10-CM | POA: Diagnosis not present

## 2017-09-06 DIAGNOSIS — E871 Hypo-osmolality and hyponatremia: Secondary | ICD-10-CM | POA: Diagnosis not present

## 2017-09-14 ENCOUNTER — Inpatient Hospital Stay (HOSPITAL_COMMUNITY): Payer: Medicare Other

## 2017-09-14 ENCOUNTER — Emergency Department (HOSPITAL_COMMUNITY): Payer: Medicare Other

## 2017-09-14 ENCOUNTER — Inpatient Hospital Stay (HOSPITAL_COMMUNITY)
Admission: EM | Admit: 2017-09-14 | Discharge: 2017-09-19 | DRG: 065 | Disposition: A | Discharge status: Rehabilitation Facility | Payer: Medicare Other | Attending: Internal Medicine | Admitting: Internal Medicine

## 2017-09-14 ENCOUNTER — Other Ambulatory Visit: Payer: Self-pay

## 2017-09-14 ENCOUNTER — Other Ambulatory Visit (HOSPITAL_COMMUNITY): Payer: Medicare Other

## 2017-09-14 ENCOUNTER — Encounter (HOSPITAL_COMMUNITY): Payer: Self-pay | Admitting: *Deleted

## 2017-09-14 DIAGNOSIS — R0902 Hypoxemia: Secondary | ICD-10-CM | POA: Diagnosis not present

## 2017-09-14 DIAGNOSIS — I481 Persistent atrial fibrillation: Secondary | ICD-10-CM | POA: Diagnosis present

## 2017-09-14 DIAGNOSIS — H9190 Unspecified hearing loss, unspecified ear: Secondary | ICD-10-CM | POA: Diagnosis present

## 2017-09-14 DIAGNOSIS — I10 Essential (primary) hypertension: Secondary | ICD-10-CM | POA: Diagnosis present

## 2017-09-14 DIAGNOSIS — Z7982 Long term (current) use of aspirin: Secondary | ICD-10-CM | POA: Diagnosis not present

## 2017-09-14 DIAGNOSIS — H3530 Unspecified macular degeneration: Secondary | ICD-10-CM | POA: Diagnosis present

## 2017-09-14 DIAGNOSIS — N390 Urinary tract infection, site not specified: Secondary | ICD-10-CM | POA: Diagnosis not present

## 2017-09-14 DIAGNOSIS — N401 Enlarged prostate with lower urinary tract symptoms: Secondary | ICD-10-CM | POA: Diagnosis present

## 2017-09-14 DIAGNOSIS — I420 Dilated cardiomyopathy: Secondary | ICD-10-CM | POA: Diagnosis present

## 2017-09-14 DIAGNOSIS — R29710 NIHSS score 10: Secondary | ICD-10-CM | POA: Diagnosis present

## 2017-09-14 DIAGNOSIS — R918 Other nonspecific abnormal finding of lung field: Secondary | ICD-10-CM | POA: Diagnosis not present

## 2017-09-14 DIAGNOSIS — I69351 Hemiplegia and hemiparesis following cerebral infarction affecting right dominant side: Secondary | ICD-10-CM | POA: Diagnosis not present

## 2017-09-14 DIAGNOSIS — Z8249 Family history of ischemic heart disease and other diseases of the circulatory system: Secondary | ICD-10-CM | POA: Diagnosis not present

## 2017-09-14 DIAGNOSIS — R338 Other retention of urine: Secondary | ICD-10-CM | POA: Diagnosis present

## 2017-09-14 DIAGNOSIS — R410 Disorientation, unspecified: Secondary | ICD-10-CM | POA: Diagnosis not present

## 2017-09-14 DIAGNOSIS — I63413 Cerebral infarction due to embolism of bilateral middle cerebral arteries: Secondary | ICD-10-CM | POA: Diagnosis not present

## 2017-09-14 DIAGNOSIS — G8191 Hemiplegia, unspecified affecting right dominant side: Secondary | ICD-10-CM | POA: Diagnosis present

## 2017-09-14 DIAGNOSIS — R471 Dysarthria and anarthria: Secondary | ICD-10-CM | POA: Diagnosis present

## 2017-09-14 DIAGNOSIS — Z7189 Other specified counseling: Secondary | ICD-10-CM | POA: Diagnosis not present

## 2017-09-14 DIAGNOSIS — K219 Gastro-esophageal reflux disease without esophagitis: Secondary | ICD-10-CM | POA: Diagnosis present

## 2017-09-14 DIAGNOSIS — R4781 Slurred speech: Secondary | ICD-10-CM | POA: Diagnosis not present

## 2017-09-14 DIAGNOSIS — Z7902 Long term (current) use of antithrombotics/antiplatelets: Secondary | ICD-10-CM | POA: Diagnosis not present

## 2017-09-14 DIAGNOSIS — R1312 Dysphagia, oropharyngeal phase: Secondary | ICD-10-CM | POA: Diagnosis not present

## 2017-09-14 DIAGNOSIS — Z9049 Acquired absence of other specified parts of digestive tract: Secondary | ICD-10-CM

## 2017-09-14 DIAGNOSIS — G9389 Other specified disorders of brain: Secondary | ICD-10-CM | POA: Diagnosis not present

## 2017-09-14 DIAGNOSIS — I639 Cerebral infarction, unspecified: Secondary | ICD-10-CM | POA: Diagnosis present

## 2017-09-14 DIAGNOSIS — Z885 Allergy status to narcotic agent status: Secondary | ICD-10-CM | POA: Diagnosis not present

## 2017-09-14 DIAGNOSIS — Z79899 Other long term (current) drug therapy: Secondary | ICD-10-CM

## 2017-09-14 DIAGNOSIS — R339 Retention of urine, unspecified: Secondary | ICD-10-CM | POA: Diagnosis not present

## 2017-09-14 DIAGNOSIS — K2270 Barrett's esophagus without dysplasia: Secondary | ICD-10-CM | POA: Diagnosis present

## 2017-09-14 DIAGNOSIS — F329 Major depressive disorder, single episode, unspecified: Secondary | ICD-10-CM | POA: Diagnosis present

## 2017-09-14 DIAGNOSIS — F411 Generalized anxiety disorder: Secondary | ICD-10-CM

## 2017-09-14 DIAGNOSIS — I671 Cerebral aneurysm, nonruptured: Secondary | ICD-10-CM | POA: Diagnosis not present

## 2017-09-14 DIAGNOSIS — R066 Hiccough: Secondary | ICD-10-CM

## 2017-09-14 DIAGNOSIS — I482 Chronic atrial fibrillation: Secondary | ICD-10-CM | POA: Diagnosis not present

## 2017-09-14 DIAGNOSIS — R29810 Facial weakness: Secondary | ICD-10-CM | POA: Diagnosis present

## 2017-09-14 DIAGNOSIS — F5101 Primary insomnia: Secondary | ICD-10-CM | POA: Diagnosis not present

## 2017-09-14 DIAGNOSIS — I69322 Dysarthria following cerebral infarction: Secondary | ICD-10-CM | POA: Diagnosis not present

## 2017-09-14 DIAGNOSIS — Z9842 Cataract extraction status, left eye: Secondary | ICD-10-CM | POA: Diagnosis not present

## 2017-09-14 DIAGNOSIS — R29818 Other symptoms and signs involving the nervous system: Secondary | ICD-10-CM | POA: Diagnosis not present

## 2017-09-14 DIAGNOSIS — E871 Hypo-osmolality and hyponatremia: Secondary | ICD-10-CM | POA: Diagnosis present

## 2017-09-14 DIAGNOSIS — E785 Hyperlipidemia, unspecified: Secondary | ICD-10-CM | POA: Diagnosis not present

## 2017-09-14 DIAGNOSIS — G629 Polyneuropathy, unspecified: Secondary | ICD-10-CM | POA: Diagnosis present

## 2017-09-14 DIAGNOSIS — Z515 Encounter for palliative care: Secondary | ICD-10-CM

## 2017-09-14 DIAGNOSIS — M4800 Spinal stenosis, site unspecified: Secondary | ICD-10-CM | POA: Diagnosis present

## 2017-09-14 DIAGNOSIS — R531 Weakness: Secondary | ICD-10-CM | POA: Diagnosis not present

## 2017-09-14 DIAGNOSIS — Z9841 Cataract extraction status, right eye: Secondary | ICD-10-CM | POA: Diagnosis not present

## 2017-09-14 DIAGNOSIS — M1990 Unspecified osteoarthritis, unspecified site: Secondary | ICD-10-CM | POA: Diagnosis present

## 2017-09-14 DIAGNOSIS — F419 Anxiety disorder, unspecified: Secondary | ICD-10-CM | POA: Diagnosis present

## 2017-09-14 DIAGNOSIS — R1310 Dysphagia, unspecified: Secondary | ICD-10-CM

## 2017-09-14 DIAGNOSIS — Z96641 Presence of right artificial hip joint: Secondary | ICD-10-CM | POA: Diagnosis present

## 2017-09-14 DIAGNOSIS — I480 Paroxysmal atrial fibrillation: Secondary | ICD-10-CM | POA: Diagnosis not present

## 2017-09-14 DIAGNOSIS — I4891 Unspecified atrial fibrillation: Secondary | ICD-10-CM | POA: Diagnosis not present

## 2017-09-14 DIAGNOSIS — Z87891 Personal history of nicotine dependence: Secondary | ICD-10-CM

## 2017-09-14 DIAGNOSIS — I69391 Dysphagia following cerebral infarction: Secondary | ICD-10-CM | POA: Diagnosis not present

## 2017-09-14 DIAGNOSIS — I6389 Other cerebral infarction: Secondary | ICD-10-CM | POA: Diagnosis not present

## 2017-09-14 DIAGNOSIS — K5900 Constipation, unspecified: Secondary | ICD-10-CM | POA: Diagnosis present

## 2017-09-14 LAB — URINALYSIS, ROUTINE W REFLEX MICROSCOPIC
Bilirubin Urine: NEGATIVE
GLUCOSE, UA: NEGATIVE mg/dL
Hgb urine dipstick: NEGATIVE
Ketones, ur: NEGATIVE mg/dL
LEUKOCYTES UA: NEGATIVE
Nitrite: NEGATIVE
PROTEIN: NEGATIVE mg/dL
Specific Gravity, Urine: 1.008 (ref 1.005–1.030)
pH: 7 (ref 5.0–8.0)

## 2017-09-14 LAB — RAPID URINE DRUG SCREEN, HOSP PERFORMED
Amphetamines: NOT DETECTED
Barbiturates: NOT DETECTED
Benzodiazepines: NOT DETECTED
COCAINE: NOT DETECTED
OPIATES: NOT DETECTED
Tetrahydrocannabinol: NOT DETECTED

## 2017-09-14 LAB — COMPREHENSIVE METABOLIC PANEL
ALBUMIN: 3.5 g/dL (ref 3.5–5.0)
ALT: 23 U/L (ref 17–63)
ANION GAP: 9 (ref 5–15)
AST: 29 U/L (ref 15–41)
Alkaline Phosphatase: 58 U/L (ref 38–126)
BILIRUBIN TOTAL: 1 mg/dL (ref 0.3–1.2)
BUN: 18 mg/dL (ref 6–20)
CO2: 25 mmol/L (ref 22–32)
Calcium: 9.3 mg/dL (ref 8.9–10.3)
Chloride: 98 mmol/L — ABNORMAL LOW (ref 101–111)
Creatinine, Ser: 1.03 mg/dL (ref 0.61–1.24)
Glucose, Bld: 122 mg/dL — ABNORMAL HIGH (ref 65–99)
POTASSIUM: 4.5 mmol/L (ref 3.5–5.1)
Sodium: 132 mmol/L — ABNORMAL LOW (ref 135–145)
TOTAL PROTEIN: 6.1 g/dL — AB (ref 6.5–8.1)

## 2017-09-14 LAB — DIFFERENTIAL
Abs Immature Granulocytes: 0 10*3/uL (ref 0.0–0.1)
Basophils Absolute: 0 10*3/uL (ref 0.0–0.1)
Basophils Relative: 1 %
EOS ABS: 0.3 10*3/uL (ref 0.0–0.7)
EOS PCT: 5 %
IMMATURE GRANULOCYTES: 0 %
LYMPHS ABS: 1.3 10*3/uL (ref 0.7–4.0)
Lymphocytes Relative: 22 %
MONOS PCT: 11 %
Monocytes Absolute: 0.6 10*3/uL (ref 0.1–1.0)
Neutro Abs: 3.6 10*3/uL (ref 1.7–7.7)
Neutrophils Relative %: 61 %

## 2017-09-14 LAB — I-STAT CHEM 8, ED
BUN: 18 mg/dL (ref 6–20)
CALCIUM ION: 1.11 mmol/L — AB (ref 1.15–1.40)
CHLORIDE: 96 mmol/L — AB (ref 101–111)
Creatinine, Ser: 1 mg/dL (ref 0.61–1.24)
Glucose, Bld: 117 mg/dL — ABNORMAL HIGH (ref 65–99)
HEMATOCRIT: 43 % (ref 39.0–52.0)
Hemoglobin: 14.6 g/dL (ref 13.0–17.0)
POTASSIUM: 4.3 mmol/L (ref 3.5–5.1)
SODIUM: 131 mmol/L — AB (ref 135–145)
TCO2: 27 mmol/L (ref 22–32)

## 2017-09-14 LAB — CBG MONITORING, ED: Glucose-Capillary: 122 mg/dL — ABNORMAL HIGH (ref 65–99)

## 2017-09-14 LAB — APTT: aPTT: 71 seconds — ABNORMAL HIGH (ref 24–36)

## 2017-09-14 LAB — CBC
HCT: 43.5 % (ref 39.0–52.0)
Hemoglobin: 14.6 g/dL (ref 13.0–17.0)
MCH: 30.9 pg (ref 26.0–34.0)
MCHC: 33.6 g/dL (ref 30.0–36.0)
MCV: 92 fL (ref 78.0–100.0)
PLATELETS: 143 10*3/uL — AB (ref 150–400)
RBC: 4.73 MIL/uL (ref 4.22–5.81)
RDW: 14 % (ref 11.5–15.5)
WBC: 5.8 10*3/uL (ref 4.0–10.5)

## 2017-09-14 LAB — PROTIME-INR
INR: 1.38
PROTHROMBIN TIME: 16.8 s — AB (ref 11.4–15.2)

## 2017-09-14 LAB — I-STAT TROPONIN, ED: Troponin i, poc: 0.07 ng/mL (ref 0.00–0.08)

## 2017-09-14 LAB — ETHANOL

## 2017-09-14 MED ORDER — HEPARIN (PORCINE) IN NACL 100-0.45 UNIT/ML-% IJ SOLN: 1000.0000 [IU]/h | INTRAMUSCULAR | Status: DC

## 2017-09-14 MED ORDER — METOPROLOL SUCCINATE ER 50 MG PO TB24: 50.0000 mg | ORAL_TABLET | Freq: Two times a day (BID) | ORAL | Status: DC

## 2017-09-14 MED ORDER — SIMVASTATIN 20 MG PO TABS
20.0000 mg | ORAL_TABLET | ORAL | Status: DC
  Administered 2017-09-15 – 2017-09-19 (×3): 20 mg via ORAL
  Filled 2017-09-14 (×4): qty 1

## 2017-09-14 MED ORDER — ASPIRIN 300 MG RE SUPP
300.0000 mg | Freq: Every day | RECTAL | Status: DC
  Administered 2017-09-16: 300 mg via RECTAL
  Filled 2017-09-14 (×2): qty 1

## 2017-09-14 MED ORDER — FAMOTIDINE 20 MG PO TABS: 20.0000 mg | ORAL_TABLET | Freq: Two times a day (BID) | ORAL | Status: DC

## 2017-09-14 MED ORDER — DABIGATRAN ETEXILATE MESYLATE 150 MG PO CAPS
150.0000 mg | ORAL_CAPSULE | Freq: Two times a day (BID) | ORAL | Status: DC
  Administered 2017-09-14 – 2017-09-15 (×3): 150 mg via ORAL
  Filled 2017-09-14 (×4): qty 1

## 2017-09-14 MED ORDER — IOPAMIDOL (ISOVUE-370) INJECTION 76%
INTRAVENOUS | Status: AC
  Filled 2017-09-14: qty 100

## 2017-09-14 MED ORDER — SODIUM CHLORIDE 0.9 % IV SOLN
INTRAVENOUS | Status: DC
  Administered 2017-09-14 – 2017-09-17 (×6): via INTRAVENOUS

## 2017-09-14 MED ORDER — ACETAMINOPHEN 160 MG/5ML PO SOLN: 650.0000 mg | ORAL | Status: DC | PRN

## 2017-09-14 MED ORDER — SODIUM CHLORIDE 0.9 % IV BOLUS
500.0000 mL | Freq: Once | INTRAVENOUS | Status: AC
  Administered 2017-09-14: 500 mL via INTRAVENOUS

## 2017-09-14 MED ORDER — CALCIUM CARBONATE-VITAMIN D 500-200 MG-UNIT PO TABS
1.0000 | ORAL_TABLET | Freq: Every day | ORAL | Status: DC
  Administered 2017-09-17 – 2017-09-19 (×3): 1 via ORAL
  Filled 2017-09-14 (×4): qty 1

## 2017-09-14 MED ORDER — TAMSULOSIN HCL 0.4 MG PO CAPS: 0.4000 mg | ORAL_CAPSULE | Freq: Every day | ORAL | Status: DC

## 2017-09-14 MED ORDER — ALPRAZOLAM 0.25 MG PO TABS
0.2500 mg | ORAL_TABLET | Freq: Every day | ORAL | Status: DC | PRN
  Administered 2017-09-15: 0.25 mg via ORAL
  Filled 2017-09-14: qty 1

## 2017-09-14 MED ORDER — STROKE: EARLY STAGES OF RECOVERY BOOK
Freq: Once | Status: DC
  Filled 2017-09-14: qty 1

## 2017-09-14 MED ORDER — PANTOPRAZOLE SODIUM 40 MG PO TBEC
40.0000 mg | DELAYED_RELEASE_TABLET | Freq: Every day | ORAL | Status: DC
  Filled 2017-09-14: qty 1

## 2017-09-14 MED ORDER — ASPIRIN 325 MG PO TABS
325.0000 mg | ORAL_TABLET | Freq: Every day | ORAL | Status: DC
  Administered 2017-09-17: 325 mg via ORAL
  Filled 2017-09-14 (×2): qty 1

## 2017-09-14 MED ORDER — ADULT MULTIVITAMIN W/MINERALS CH
1.0000 | ORAL_TABLET | Freq: Every day | ORAL | Status: DC
  Administered 2017-09-17 – 2017-09-19 (×3): 1 via ORAL
  Filled 2017-09-14 (×4): qty 1

## 2017-09-14 MED ORDER — DABIGATRAN ETEXILATE MESYLATE 150 MG PO CAPS
150.0000 mg | ORAL_CAPSULE | Freq: Two times a day (BID) | ORAL | Status: DC
  Filled 2017-09-14: qty 1

## 2017-09-14 MED ORDER — ACETAMINOPHEN 650 MG RE SUPP: 650.0000 mg | RECTAL | Status: DC | PRN

## 2017-09-14 MED ORDER — SENNOSIDES-DOCUSATE SODIUM 8.6-50 MG PO TABS: 1.0000 | ORAL_TABLET | Freq: Every evening | ORAL | Status: DC | PRN

## 2017-09-14 MED ORDER — IOPAMIDOL (ISOVUE-370) INJECTION 76%
100.0000 mL | Freq: Once | INTRAVENOUS | Status: AC | PRN
  Administered 2017-09-14: 100 mL via INTRAVENOUS

## 2017-09-14 MED ORDER — CALCIUM-MAGNESIUM-VITAMIN D 500-250-200 MG-MG-UNIT PO TABS: ORAL_TABLET | Freq: Every day | ORAL | Status: DC

## 2017-09-14 MED ORDER — ACETAMINOPHEN 325 MG PO TABS: 650.0000 mg | ORAL_TABLET | ORAL | Status: DC | PRN

## 2017-09-14 MED ORDER — RESOURCE THICKENUP CLEAR PO POWD
ORAL | Status: DC | PRN
  Filled 2017-09-14 (×2): qty 125

## 2017-09-14 NOTE — ED Notes (Signed)
Dr. Leonie Man paged to [facility] RN @ [phone].

## 2017-09-14 NOTE — Progress Notes (Signed)
ANTICOAGULATION CONSULT NOTE - Initial Consult  Pharmacy Consult for Pradaxa Indication: atrial fibrillation  Allergies  Allergen Reactions  . Other     vicroyl sutures  . Morphine Other (See Comments)    Edgy   . Oxycodone Other (See Comments)    Edgy       Vital Signs: BP: 137/87 (05/29 1030) Pulse Rate: 71 (05/29 1030)  Labs: Recent Labs    09/14/17 0359 09/14/17 0406  HGB 14.6 14.6  HCT 43.5 43.0  PLT 143*  --   APTT 71*  --   LABPROT 16.8*  --   INR 1.38  --   CREATININE 1.03 1.00    CrCl cannot be calculated (Unknown ideal weight.).   Medical History: Past Medical History:  Diagnosis Date  . A-fib ([place])   . AAA (abdominal aortic aneurysm) ([place])   . Abnormality of gait 12/20/2013  . Anxiety and depression   . Anxiety and depression   . Arthritis   . Barrett's esophagus   . BPH (benign prostatic hyperplasia)   . Degenerative arthritis   . GERD (gastroesophageal reflux disease)   . History of concussion   . HOH (hard of hearing)   . Hyperlipidemia   . Hypertension   . Left tibial fracture   . Macular degeneration Left  . Polyneuropathy in other diseases classified elsewhere ([place]) 12/20/2013  . Right clavicle fracture   . Spinal stenosis   . Transient global amnesia   . Vertigo     Assessment: 84 yo male with atrial fibrillation. Consulted to start Pradaxa. Patient with normal renal function.   Plan:  Start Pradaxa 150 mg q12hr Monitor renal function Monitor for signs and symptoms of bleeding  Corinda Gubler 09/14/2017,11:07 AM

## 2017-09-14 NOTE — ED Notes (Signed)
Patient requesting banana pudding and vanilla ice cream for dinner; nutritional services states banana pudding not avail but can have vanilla pudding with vanilla wafers and a banana

## 2017-09-14 NOTE — ED Triage Notes (Signed)
Pt went to bed at 2200. Woke up at 0300 to use the restroom, noticed difficulty grasping objects with R hand. EMS reports loading pt in the ambulance and pt had complete R sided  Flaccidity, R sided facial droop and slurred speech at 0330. Pt is on Pradaxa, last dose was last night

## 2017-09-14 NOTE — Consult Note (Signed)
Neurology Consultation  Reason for Consult: Code stroke Referring Physician: Dr. 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  EMS was called.  Upon initial assessment, he had some right grip weakness within the next 15 to 20 minutes, his weakness progressed to involve his right face arm and leg.  His speech became very slurred.  He was still able to follow all commands.  His systolic blood pressures remain in the 150s and his heart rate remained in the 50s. There was no report of seizure-like activity or gaze preference.  Patient has documented history of TGA's and the wife did not report this to be similar to his prior TGAs. On initial assessment, he had near hemiplegia on the right arm and leg and right facial droop with moderate to severe dysarthria.  No gaze preference or deviation noted.  No neglect.  No aphasia.  LKW: 10 PM on 09/05/2017 tpa given?: no, outside the window, is on Pradaxa-last dose 09/13/2017 Premorbid modified Rankin scale (mRS): 3  ROS: ROS was performed and is negative except as noted in the HPI   Past Medical History:  Diagnosis Date  . A-fib ([place])   . AAA (abdominal aortic aneurysm) ([place])   . Abnormality of gait 12/20/2013  . Anxiety and depression   . Anxiety and depression   . Arthritis   . Barrett's esophagus   . BPH (benign prostatic hyperplasia)   . Degenerative arthritis   . GERD (gastroesophageal reflux disease)   . History of concussion   . HOH (hard of hearing)   . Hyperlipidemia   . Hypertension   . Left tibial fracture   .  Macular degeneration Left  . Polyneuropathy in other diseases classified elsewhere ([place]) 12/20/2013  . Right clavicle fracture   . Spinal stenosis   . Transient global amnesia   . Vertigo     Family History  Problem Relation Age of Onset  . Heart disease Sister        AFIB  . Neuropathy Neg Hx    Social History:   reports that he quit smoking about 51 years ago. He has never used smokeless tobacco. He reports that he does not drink alcohol or use drugs.  Medications No current facility-administered medications for this encounter.   Current Outpatient Medications:  .  ALPRAZolam (XANAX) 0.25 MG tablet, Take 1 tablet (0.25 mg total) by mouth daily as needed for anxiety., Disp: 30 tablet, Rfl: 0 .  Ascorbic Acid (VITAMIN C) 1000 MG tablet, Take 1,000 mg by mouth daily., Disp: , Rfl:  .  aspirin 81 MG tablet, Take 81 mg by mouth at bedtime. , Disp: , Rfl:  .  Calcium-Magnesium-Vitamin D (CALCIUM 500 PO), Take 1 tablet by mouth daily., Disp: , Rfl:  .  dabigatran (PRADAXA) 150 MG CAPS capsule, Take 150 mg by mouth 2 (two) times daily., Disp: , Rfl:  .  fexofenadine (ALLEGRA) 180 MG tablet, Take 180 mg by mouth every morning., Disp: , Rfl:  .  fluticasone (FLONASE) 50 MCG/ACT nasal spray, Place 2 sprays into both nostrils daily for  7 days., Disp: 1 g, Rfl: 0 .  furosemide (LASIX) 20 MG tablet, Take 1 tablet (20 mg total) by mouth daily., Disp: 30 tablet, Rfl: 0 .  ibuprofen (ADVIL,MOTRIN) 200 MG tablet, Take by mouth. 600 mg each morning., Disp: , Rfl:  .  lisinopril (PRINIVIL,ZESTRIL) 5 MG tablet, Take 5 mg by mouth daily after lunch. , Disp: , Rfl:  .  LUTEIN PO, Take 25 mg by mouth daily after lunch. , Disp: , Rfl:  .  metoprolol succinate (TOPROL-XL) 50 MG 24 hr tablet, Take 50 mg by mouth 2 (two) times daily. Take with or immediately following a meal. , Disp: , Rfl:  .  Multiple Vitamin (MULTIVITAMIN) tablet, Take 1 tablet by mouth daily., Disp: , Rfl:  .  Omega-3 Fatty Acids (OMEGA 3  PO), Take 1 tablet by mouth daily after lunch. , Disp: , Rfl:  .  omeprazole (PRILOSEC) 20 MG capsule, Take 20 mg by mouth every morning. , Disp: , Rfl:  .  POTASSIUM PO, Take 1 tablet by mouth daily after lunch. , Disp: , Rfl:  .  pregabalin (LYRICA) 75 MG capsule, Take 75 mg by mouth 4 (four) times daily as needed., Disp: , Rfl:  .  ranitidine (ZANTAC) 150 MG tablet, Take 150 mg by mouth 2 (two) times daily. Take in the afternoon and bedtime, Disp: , Rfl:  .  Selenium 200 MCG TABS, Take 1 tablet by mouth daily after lunch. , Disp: , Rfl:  .  simvastatin (ZOCOR) 20 MG tablet, Take 20 mg by mouth every morning. , Disp: , Rfl:  .  sodium chloride (OCEAN) 0.65 % SOLN nasal spray, Place 1 spray into both nostrils every morning., Disp: , Rfl:  .  tamsulosin (FLOMAX) 0.4 MG CAPS capsule, Take 0.4 mg by mouth at bedtime. , Disp: , Rfl:  .  zinc gluconate 50 MG tablet, Take 50 mg by mouth every morning. , Disp: , Rfl:    Exam: Current vital signs: BP (!) 148/91   Pulse 72   Resp 12   SpO2 96%  Vital s igns in last 24 hours: Pulse Rate:  [72] 72 (05/29 0411) Resp:  [12] 12 (05/29 0411) BP: (148)/(91) 148/91 (05/29 0411) SpO2:  [96 %] 96 % (93/57 0177) Systolic blood pressure 939 Heart rate in the 50s General: Awake alert in no apparent distress H ENT: Normocephalic atraumatic, dry mucous membranes CVS: Bradycardic, no murmur rub gallop, irregularly irregular Abdomen: Nondistended nontender Extremities: Warm well perfused Neurological exam Patient is awake alert oriented x3. His speech is severely dysarthric. Naming, comprehension and repetition are intact. Cranial nerves: Pupils equal round react light, external movements intact, visual fields full, right facial weakness in the lower face, auditory acuity decreased bilaterally, palate midline, tongue midline. Motor exam: 1/5 right upper extremity, 2/5 right lower extremity, 5/5 left upper and lower extremities. Sensory exam: intact to  LT Coordination: left side FNF intact Gait: Deferred NIHSS 1a Level of Conscious.: 0 1b LOC Questions: 0 1c LOC Commands:0  2 Best Gaze: 0 3 Visual: 0 4 Facial Palsy:2  5a Motor Arm - left:0  5b Motor Arm - Right:3  6a Motor Leg - Left: 0 6b Motor Leg - Right: 3 7 Limb Ataxia: 0 8 Sensory: 0 9 Best Language: 0 10 Dysarthria: 2 11 Extinct. and Inatten.: 0 TOTAL:10  Labs I have reviewed labs in epic and the results pertinent to this consultation are: CBC    Component Value Date/Time   WBC 10.8 (  H) 08/16/2017 0651   RBC 4.48 08/16/2017 0651   HGB 14.2 08/16/2017 0651   HCT 40.3 08/16/2017 0651   PLT 181 08/16/2017 0651   MCV 90.0 08/16/2017 0651   MCH 31.7 08/16/2017 0651   MCHC 35.2 08/16/2017 0651   RDW 13.6 08/16/2017 0651    CMP     Component Value Date/Time   NA 126 (L) 08/16/2017 0651   K 4.5 08/16/2017 0651   CL 92 (L) 08/16/2017 0651   CO2 24 08/16/2017 0651   GLUCOSE 115 (H) 08/16/2017 0651   BUN 14 08/16/2017 0651   CREATININE 0.83 08/16/2017 0651   CALCIUM 9.0 08/16/2017 0651   PROT 6.5 08/15/2017 1132   PROT 6.0 12/20/2013 0922   ALBUMIN 3.3 (L) 08/15/2017 1132   AST 36 08/15/2017 1132   ALT 24 08/15/2017 1132   ALKPHOS 76 08/15/2017 1132   BILITOT 2.0 (H) 08/15/2017 1132   GFRNONAA >60 08/16/2017 0651   GFRAA >60 08/16/2017 3244   Imaging I have reviewed the images obtained: CT-scan of the brain-no bleed.  Possible evolving infarct in the posterior limb of the left internal capsule. ASPECTs per rads - 10. No acute findings.  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 Outside the window for IV TPA.  Also on anticoagulation, hence not a candidate for TPA. Noncontrast CT of the head shows no bleed.  Possible hypodensity in the  posterior limb of the left internal capsule.  Multiple other old lacunar's.  Global atrophy. Less likely to be a large vessel occlusion given right-sided symptoms with no aphasia, gaze or other cortical signs. Will obtain CT angiogram and CT perfusion study to better assess head and neck vasculature and perfusion. Will require admission for stroke work-up  Impression: Acute ischemic stroke-likely small vessel, could be embolic given A. fib  Recommendations: -Admit to hospitalist -Frequent neurochecks -MRI brain w/o contrast -Telemetry -2D echocardiogram -Hemoglobin A1c -Lipid panel -Physical therapy, speech therapy, occupational therapy -Prophylactic treatment: Until MRI is done, hold anti-coagulation.  Use aspirin 325 p.o. daily -Atorvastatin 80 mg PO daily  -- Amie Portland, MD [facility] Pager: [phone] If 7pm to 7am, please call on call as listed on AMION.   ADDENDUM AFTER CTA/P -Some technical difficulty in obtaining CTA-delayed CTA and perfusion. Initial CTA failed due to some trigerring issue where contrast was injected but image was acquired prior to full transit of the dye. -CT tech checked with neurorads on call who instructed to repeat the CTA and then perform perfusion -CTA - poor quality - no ELVO, calcifications -CTP - poor quality - showed some perfusion deficit in multiple territories, which seems more artifactual and does not match patient's symptoms.  Recs as above. Admit for stroke work up.   -- Amie Portland, MD [facility] Pager: [phone] If 7pm to 7am, please call on call as listed on AMION.  Please page stroke NP/PA/MD (listed on AMION)  from 8am-4 pm as this patient will be followed by the stroke team at this point.

## 2017-09-14 NOTE — ED Notes (Signed)
Dr Leonie Man at bedside advised this Rn that patient needs his pradaxa, Rn made MD aware that patient failed ED stroke swallow screen, MD advised to have speech eval do swallow screen as soon as possible so that patient can get pradax if he passes, if pt fails then MD wanted to be contacted for iv heparin orders to be placed

## 2017-09-14 NOTE — ED Notes (Signed)
Patients speech more slurred again, pt only able to move right arm minimally at this time versus slight drift upon arrival to floor. Pt remains a/ox4.  Dr. Reesa Chew paged and made aware that patient's symptoms had improved but have worsened again.

## 2017-09-14 NOTE — ED Notes (Signed)
Attempted report x 1; name and call back number provided 

## 2017-09-14 NOTE — Evaluation (Signed)
Clinical/Bedside Swallow Evaluation Patient Details  Name: Walter Bryant MRN: [id number] Date of Birth: Oct 28, 1930  Today's Date: 09/14/2017 Time: SLP Start Time (ACUTE ONLY): 1332 SLP Stop Time (ACUTE ONLY): 1352 SLP Time Calculation (min) (ACUTE ONLY): 20 min  Past Medical History:  Past Medical History:  Diagnosis Date  . A-fib ([place])   . AAA (abdominal aortic aneurysm) ([place])   . Abnormality of gait 12/20/2013  . Anxiety and depression   . Anxiety and depression   . Arthritis   . Barrett's esophagus   . BPH (benign prostatic hyperplasia)   . Degenerative arthritis   . GERD (gastroesophageal reflux disease)   . History of concussion   . HOH (hard of hearing)   . Hyperlipidemia   . Hypertension   . Left tibial fracture   . Macular degeneration Left  . Polyneuropathy in other diseases classified elsewhere ([place]) 12/20/2013  . Right clavicle fracture   . Spinal stenosis   . Transient global amnesia   . Vertigo    Past Surgical History:  Past Surgical History:  Procedure Laterality Date  . ABDOMINAL AORTIC ANEURYSM REPAIR  2016   stent placement  . APPENDECTOMY    . CATARACT EXTRACTION     Bilateral  . KNEE SURGERY Bilateral    both  knees replaced  . LAPAROSCOPIC RIGHT HEMI COLECTOMY    . LUMBAR LAMINECTOMY     L4   . REFRACTIVE SURGERY Left    macular degeneration  . TONSILLECTOMY AND ADENOIDECTOMY    . TOTAL HIP ARTHROPLASTY Right    x 2  . 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 Pt admitted for stroke work up with MRI revealing "1. Acute left basal ganglia infarct. 2. Punctate acute infarcts in the right frontal and posterior. 3. Mild-to-moderate chronic small vessel ischemic disease and moderate cerebral atrophy." Daughter reports recent pneumonia. ED visit noted 08/13/17. Most  recent CXR 5/29 shows "Worsening left basilar airspace opacity is concerning for recurrent pneumonia"   Assessment / Plan / Recommendation Clinical Impression  Pt presents with clinical indicators of pharyngeal dysphagia in setting of recent CVA.  With thin liquid pt required 2-3 swallows per bolus and exhibited delayed mild throat clear.  Pt tolerated nectar thick liquid, puree, and solid consistencies with no clinical s/s of aspiraiton and exhbited good oral clearance of solids.  Recommend regular diet with nectar thick liquids.  Recommend further evaluation of pharyngeal swallow function by MBSS.  Give medications whole in puree pending results of MBSS. SLP Visit Diagnosis: Dysphagia, oropharyngeal phase (R13.12)    Aspiration Risk  Mild aspiration risk    Diet Recommendation Regular;Nectar-thick liquid   Liquid Administration via: Cup Medication Administration: Whole meds with puree Supervision: Patient able to self feed    Other  Recommendations Oral Care Recommendations: Oral care BID Other Recommendations: Prohibited food (jello, ice cream, thin soups);Remove water pitcher(pending results of MBSS)   Follow up Recommendations (Pending results of further evaluation)          Prognosis Prognosis for Safe Diet Advancement: Good      Swallow Study   General Date of Onset: 09/14/17 HPI: 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 Pt admitted for stroke  work up with MRI revealing "1. Acute left basal ganglia infarct. 2. Punctate acute infarcts in the right frontal and posterior. 3. Mild-to-moderate chronic small vessel ischemic disease and moderate cerebral atrophy." Daughter reports recent pneumonia. ED visit noted 08/13/17. Most recent CXR 5/29 shows "Worsening left basilar airspace opacity is concerning for recurrent pneumonia" Type of  Study: Bedside Swallow Evaluation Previous Swallow Assessment: None Diet Prior to this Study: Regular Temperature Spikes Noted: No Respiratory Status: Room air History of Recent Intubation: No Behavior/Cognition: Alert;Cooperative;Pleasant mood Oral Cavity Assessment: Within Functional Limits Oral Care Completed by SLP: No Oral Cavity - Dentition: Adequate natural dentition Self-Feeding Abilities: Able to feed self Patient Positioning: Upright in bed Baseline Vocal Quality: Normal Volitional Cough: Strong Volitional Swallow: Able to elicit    Oral/Motor/Sensory Function Overall Oral Motor/Sensory Function: Mild impairment Facial ROM: Within Functional Limits Facial Symmetry: Within Functional Limits Lingual ROM: Within Functional Limits Lingual Symmetry: (Protrudes to R) Lingual Strength: Reduced(R) Velum: (Slight R assymmetry)   Thin Liquid Thin Liquid: Impaired Presentation: Cup;Straw Pharyngeal  Phase Impairments: Cough - Delayed;Multiple swallows    Nectar Thick Nectar Thick Liquid: Within functional limits Presentation: Cup   Puree Puree: Within functional limits Presentation: Spoon   Solid     Solid: Within functional limits Presentation: Self Fed        Dion Sibal E Marcia Lepera 09/14/2017,2:31 PM

## 2017-09-14 NOTE — Progress Notes (Signed)
RT instructed pt and family on the use of flutter valve and incentive spirometer.  Pt able to demonstrate back good technique on both devices and is able to reach 2500 mL with the incentive spirometer.

## 2017-09-14 NOTE — ED Notes (Signed)
Dr. Reesa Chew advised he contacted stroke team and they will come to evaluate patient, advised patients HOB should be flat at this time and new orders for fluids placed.

## 2017-09-14 NOTE — ED Notes (Signed)
Neurologist at bedside. 

## 2017-09-14 NOTE — ED Notes (Signed)
Pt taken to xray 

## 2017-09-14 NOTE — Progress Notes (Signed)
OT Evaluation  Pt reports he was mod I with ADL PTA; required supervision for showering due to hx of multiple falls. Limited eval performed; pt declining bed mobility or participation in functional activities at this time. Pt reports he feels like he is slowly returning to baseline but still has residual R sided weakness. Pt noted to have R fine motor coordination deficits but strength and ROM grossly WFL with formal testing. D/c recommendations pending upon further mobility progression. Pt would benefit from continued skilled OT to address established goals.   09/14/17 1544  OT Visit Information  Last OT Received On 09/14/17  Assistance Needed +2 (ambulation/progress mobility)  History of Present Illness Pt is an 84 y/o male who presents with RUE weakness and slurred speech. MRI revealed an acute L basilar infarct, as well as R frontal, posterior and occipital lobe infarcts. PMH significant for vertigo, R clavicle fx, polyneuropathy, macular degeneration, L tibial fx, HTN, HOH, Barrett's esophagus, a-fib, AAA.   Precautions  Precautions Fall  Restrictions  Weight Bearing Restrictions No  Home Living  Family/patient expects to be discharged to: Private residence  Living Arrangements Spouse/significant other  Available Help at Discharge Family;Available 24 hours/day  Type of Home House  Home Access Level entry  Home Layout One level  Bathroom Shower/Tub Walk-in Patent examiner - 4 wheels;Shower seat;Grab bars - toilet;Grab bars - tub/shower;BSC  Additional Comments From [facility]  Prior Function  Level of Independence Needs assistance  Gait / Transfers Assistance Needed Rollator for mobility  ADL's / [place] assisting with bathing due to hx of falls in shower and balance deficits  Communication  Communication HOH  Pain Assessment  Pain Assessment No/denies pain  Cognition  Arousal/Alertness Awake/alert  Behavior  During Therapy WFL for tasks assessed/performed  Overall Cognitive Status Within Functional Limits for tasks assessed  Upper Extremity Assessment  Upper Extremity Assessment RUE deficits/detail  RUE Deficits / Details Grossly WFL during formal ROM/MMT testing, pt declining functional activities at this time  RUE Sensation WNL  RUE Coordination decreased fine motor  Lower Extremity Assessment  Lower Extremity Assessment Defer to PT evaluation  ADL  Overall ADL's  Needs assistance/impaired  Eating/Feeding Set up;Bed level  Grooming Set up;Bed level  General ADL Comments Pt declining bed mobility or transfers at this time. Reports he feels he is slowly returning to baseline but still feels RLE is weak.   Vision- History  Baseline Vision/History Wears glasses  Wears Glasses At all times  Patient Visual Report No change from baseline  Vision- Assessment  Vision Assessment? No apparent visual deficits  Bed Mobility  General bed mobility comments Pt declining bed mobility at this time  Transfers  General transfer comment Pt declining transfers at this time  OT - End of Session  Activity Tolerance Patient limited by fatigue  Patient left in bed;with call bell/phone within reach;with family/visitor present  OT Assessment  OT Recommendation/Assessment Patient needs continued OT Services  OT Visit Diagnosis Unsteadiness on feet (R26.81);Other abnormalities of gait and mobility (R26.89)  OT Problem List Decreased strength;Decreased range of motion;Decreased activity tolerance;Impaired balance (sitting and/or standing);Decreased coordination  OT Plan  OT Frequency (ACUTE ONLY) Min 2X/week  OT Treatment/Interventions (ACUTE ONLY) Self-care/ADL training;Therapeutic exercise;Neuromuscular education;Energy conservation;DME and/or AE instruction;Therapeutic activities;Patient/family education;Balance training  AM-PAC OT "6 Clicks" Daily Activity Outcome Measure  Help from another person eating  meals? 3  Help from another person taking care of personal grooming? 3  Help from another person toileting, which includes using toliet, bedpan, or urinal? 2  Help from another person bathing (including washing, rinsing, drying)? 2  Help from another person to put on and taking off regular upper body clothing? 3  Help from another person to put on and taking off regular lower body clothing? 2  6 Click Score 15  ADL G Code Conversion CK  OT Recommendation  Follow Up Recommendations Other (comment) (TBD pending further assessment)  OT Equipment Other (comment) (TBD)  Individuals Consulted  Consulted and Agree with Results and Recommendations Patient;Family member/caregiver  Family Member Consulted wife  Acute Rehab OT Goals  Patient Stated Goal Return to PLOF  OT Goal Formulation With patient/family  Time For Goal Achievement 09/28/17  Potential to Achieve Goals Good  OT Time Calculation  OT Start Time (ACUTE ONLY) 1531  OT Stop Time (ACUTE ONLY) 1544  OT Time Calculation (min) 13 min  OT General Charges  $OT Visit 1 Visit  OT Evaluation  $OT Eval Moderate Complexity 1 Mod  Written Expression  Dominant Hand Right   [place] A. Ulice Brilliant, M.S., OTR/L Acute Rehab Department: [phone]

## 2017-09-14 NOTE — Progress Notes (Signed)
ANTICOAGULATION CONSULT NOTE - Follow Up Consult  Pharmacy Consult for acute CVA Indication: stroke  Allergies  Allergen Reactions  . Other     vicroyl sutures  . Morphine Other (See Comments)    Edgy   . Oxycodone Other (See Comments)    Edgy     Patient Measurements: Weight: 207 lb (93.9 kg)   Vital Signs: BP: 140/85 (05/29 1231) Pulse Rate: 71 (05/29 1231)  Labs: Recent Labs    09/14/17 0359 09/14/17 0406  HGB 14.6 14.6  HCT 43.5 43.0  PLT 143*  --   APTT 71*  --   LABPROT 16.8*  --   INR 1.38  --   CREATININE 1.03 1.00    Estimated Creatinine Clearance: 63.2 mL/min (by C-G formula based on SCr of 1 mg/dL).   Assessment: 84 yo male with a.fib and new CVA. Starting on Heparin gtt.  Goal of Therapy:  Heparin level 0.3-0.5 units/ml Monitor platelets by anticoagulation protocol: Yes   Plan:  Start heparin infusion at 1000 units/hr  Monitor Heparin level in 6 hours Monitor CBC and for signs and symptoms of bleeding  Corinda Gubler 09/14/2017,1:24 PM

## 2017-09-14 NOTE — ED Notes (Signed)
Dr Reesa Chew contacted this RN and stated that he can order lovenox for patient if patient is cleared by neurology to be placed on anticoagulants.

## 2017-09-14 NOTE — ED Provider Notes (Addendum)
[facility] EMERGENCY DEPARTMENT Provider Note   CSN: [id number] Arrival date & time: 09/14/17  2376   An emergency department physician performed an initial assessment on this suspected stroke patient at 0357.  History   Chief Complaint Chief Complaint  Patient presents with  . Code Stroke    HPI Walter Bryant is a 84 y.o. male.  The history is provided by the EMS personnel. The history is limited by the condition of the patient.  Cerebrovascular Accident  This is a new problem. The current episode started 6 to 12 hours ago. The problem occurs constantly. The problem has been gradually worsening. Pertinent negatives include no chest pain and no abdominal pain. Nothing aggravates the symptoms. Nothing relieves the symptoms. He has tried nothing for the symptoms. The treatment provided no relief.  Last seen normal at 10 pm.  Awoke at 3 am to use restroom.  EMS called for weakness of RUE.  Then developed dysarthria and facial droop.    Past Medical History:  Diagnosis Date  . A-fib ([place])   . AAA (abdominal aortic aneurysm) ([place])   . Abnormality of gait 12/20/2013  . Anxiety and depression   . Anxiety and depression   . Arthritis   . Barrett's esophagus   . BPH (benign prostatic hyperplasia)   . Degenerative arthritis   . GERD (gastroesophageal reflux disease)   . History of concussion   . HOH (hard of hearing)   . Hyperlipidemia   . Hypertension   . Left tibial fracture   . Macular degeneration Left  . Polyneuropathy in other diseases classified elsewhere ([place]) 12/20/2013  . Right clavicle fracture   . Spinal stenosis   . Transient global amnesia   . Vertigo     Patient Active Problem List   Diagnosis Date Noted  . Hyponatremia 08/17/2017  . Fluid overload 08/16/2017  . Anxiety 08/16/2017  . CAP (community acquired pneumonia) 08/15/2017  . Polyneuropathy in other diseases classified elsewhere ([place]) 12/20/2013  . Abnormality of gait 12/20/2013  .  Non-healing right groin open wound 04/09/2013    Past Surgical History:  Procedure Laterality Date  . ABDOMINAL AORTIC ANEURYSM REPAIR  2016   stent placement  . APPENDECTOMY    . CATARACT EXTRACTION     Bilateral  . KNEE SURGERY Bilateral    both  knees replaced  . LAPAROSCOPIC RIGHT HEMI COLECTOMY    . LUMBAR LAMINECTOMY     L4   . REFRACTIVE SURGERY Left    macular degeneration  . TONSILLECTOMY AND ADENOIDECTOMY    . TOTAL HIP ARTHROPLASTY Right    x 2  . WRIST FRACTURE SURGERY Right         Home Medications    Prior to Admission medications   Medication Sig Start Date End Date Taking? Authorizing Provider  ALPRAZolam (XANAX) 0.25 MG tablet Take 1 tablet (0.25 mg total) by mouth daily as needed for anxiety. 08/17/17   Elwyn Reach, MD  Ascorbic Acid (VITAMIN C) 1000 MG tablet Take 1,000 mg by mouth daily.    [provider]  aspirin 81 MG tablet Take 81 mg by mouth at bedtime.     [provider]  Calcium-Magnesium-Vitamin D (CALCIUM 500 PO) Take 1 tablet by mouth daily.    [provider]  dabigatran (PRADAXA) 150 MG CAPS capsule Take 150 mg by mouth 2 (two) times daily.    [provider]  fexofenadine (ALLEGRA) 180 MG tablet Take 180 mg  by mouth every morning.    [provider]  fluticasone (FLONASE) 50 MCG/ACT nasal spray Place 2 sprays into both nostrils daily for 7 days. 08/13/17 08/20/17  Long, Wonda Olds, MD  furosemide (LASIX) 20 MG tablet Take 1 tablet (20 mg total) by mouth daily. 08/17/17   Elwyn Reach, MD  ibuprofen (ADVIL,MOTRIN) 200 MG tablet Take by mouth. 600 mg each morning.    [provider]  lisinopril (PRINIVIL,ZESTRIL) 5 MG tablet Take 5 mg by mouth daily after lunch.     [provider]  LUTEIN PO Take 25 mg by mouth daily after lunch.     [provider]  metoprolol succinate (TOPROL-XL) 50 MG 24 hr tablet Take 50 mg by mouth 2 (two) times daily. Take with or immediately  following a meal.     [provider]  Multiple Vitamin (MULTIVITAMIN) tablet Take 1 tablet by mouth daily.    [provider]  Omega-3 Fatty Acids (OMEGA 3 PO) Take 1 tablet by mouth daily after lunch.     [provider]  omeprazole (PRILOSEC) 20 MG capsule Take 20 mg by mouth every morning.     [provider]  POTASSIUM PO Take 1 tablet by mouth daily after lunch.     [provider]  pregabalin (LYRICA) 75 MG capsule Take 75 mg by mouth 4 (four) times daily as needed.    [provider]  ranitidine (ZANTAC) 150 MG tablet Take 150 mg by mouth 2 (two) times daily. Take in the afternoon and bedtime    [provider]  Selenium 200 MCG TABS Take 1 tablet by mouth daily after lunch.     [provider]  simvastatin (ZOCOR) 20 MG tablet Take 20 mg by mouth every morning.  01/09/13   [provider]  sodium chloride (OCEAN) 0.65 % SOLN nasal spray Place 1 spray into both nostrils every morning.    [provider]  tamsulosin (FLOMAX) 0.4 MG CAPS capsule Take 0.4 mg by mouth at bedtime.     [provider]  zinc gluconate 50 MG tablet Take 50 mg by mouth every morning.     [provider]    Family History Family History  Problem Relation Age of Onset  . Heart disease Sister        AFIB  . Neuropathy Neg Hx     Social History Social History   Tobacco Use  . Smoking status: Former Smoker    Last attempt to quit: 04/19/1966    Years since quitting: 51.4  . Smokeless tobacco: Never Used  Substance Use Topics  . Alcohol use: No  . Drug use: No     Allergies   Other; Morphine; and Oxycodone   Review of Systems Review of Systems  Unable to perform ROS: Acuity of condition  Constitutional: Negative for fever.  Eyes: Negative for visual disturbance.  Cardiovascular: Negative for chest pain.  Gastrointestinal: Negative for abdominal pain.  Neurological: Positive for facial  asymmetry, speech difficulty and weakness.     Physical Exam Updated Vital Signs BP (!) 148/91   Pulse 72   Resp 12   SpO2 96%   Physical Exam  Constitutional: He appears well-developed and well-nourished.  HENT:  Head: Normocephalic and atraumatic.  Nose: Nose normal.  Eyes: Conjunctivae and EOM are normal.  Neck: Normal range of motion. Neck supple.  Cardiovascular: Normal rate and intact distal pulses. An irregularly irregular rhythm present.  Pulmonary/Chest: Effort  normal and breath sounds normal. No stridor. He has no wheezes. He has no rales.  Abdominal: Soft. Bowel sounds are normal. He exhibits no mass. There is no tenderness. There is no rebound and no guarding.  Musculoskeletal: He exhibits no deformity.  Neurological: He is alert. He displays normal reflexes. A cranial nerve deficit is present. GCS eye subscore is 1. GCS verbal subscore is 1.  Skin: Skin is warm and dry. Capillary refill takes less than 2 seconds. He is not diaphoretic.  Psychiatric: He has a normal mood and affect.  Nursing note and vitals reviewed.    ED Treatments / Results  Labs (all labs ordered are listed, but only abnormal results are displayed) Results for orders placed or performed during the hospital encounter of 09/14/17  Ethanol  Result Value Ref Range   Alcohol, Ethyl (B) <10 <10 mg/dL  Protime-INR  Result Value Ref Range   Prothrombin Time 16.8 (H) 11.4 - 15.2 seconds   INR 1.38   APTT  Result Value Ref Range   aPTT 71 (H) 24 - 36 seconds  CBC  Result Value Ref Range   WBC 5.8 4.0 - 10.5 K/uL   RBC 4.73 4.22 - 5.81 MIL/uL   Hemoglobin 14.6 13.0 - 17.0 g/dL   HCT 43.5 39.0 - 52.0 %   MCV 92.0 78.0 - 100.0 fL   MCH 30.9 26.0 - 34.0 pg   MCHC 33.6 30.0 - 36.0 g/dL   RDW 14.0 11.5 - 15.5 %   Platelets 143 (L) 150 - 400 K/uL  Differential  Result Value Ref Range   Neutrophils Relative % 61 %   Neutro Abs 3.6 1.7 - 7.7 K/uL   Lymphocytes Relative 22 %   Lymphs Abs 1.3 0.7  - 4.0 K/uL   Monocytes Relative 11 %   Monocytes Absolute 0.6 0.1 - 1.0 K/uL   Eosinophils Relative 5 %   Eosinophils Absolute 0.3 0.0 - 0.7 K/uL   Basophils Relative 1 %   Basophils Absolute 0.0 0.0 - 0.1 K/uL   Immature Granulocytes 0 %   Abs Immature Granulocytes 0.0 0.0 - 0.1 K/uL  Comprehensive metabolic panel  Result Value Ref Range   Sodium 132 (L) 135 - 145 mmol/L   Potassium 4.5 3.5 - 5.1 mmol/L   Chloride 98 (L) 101 - 111 mmol/L   CO2 25 22 - 32 mmol/L   Glucose, Bld 122 (H) 65 - 99 mg/dL   BUN 18 6 - 20 mg/dL   Creatinine, Ser 1.03 0.61 - 1.24 mg/dL   Calcium 9.3 8.9 - 10.3 mg/dL   Total Protein 6.1 (L) 6.5 - 8.1 g/dL   Albumin 3.5 3.5 - 5.0 g/dL   AST 29 15 - 41 U/L   ALT 23 17 - 63 U/L   Alkaline Phosphatase 58 38 - 126 U/L   Total Bilirubin 1.0 0.3 - 1.2 mg/dL   GFR calc non Af Amer >60 >60 mL/min   GFR calc Af Amer >60 >60 mL/min   Anion gap 9 5 - 15  I-Stat Chem 8, ED  Result Value Ref Range   Sodium 131 (L) 135 - 145 mmol/L   Potassium 4.3 3.5 - 5.1 mmol/L   Chloride 96 (L) 101 - 111 mmol/L   BUN 18 6 - 20 mg/dL   Creatinine, Ser 1.00 0.61 - 1.24 mg/dL   Glucose, Bld 117 (H) 65 - 99 mg/dL   Calcium, Ion 1.11 (L) 1.15 - 1.40 mmol/L   TCO2 27  22 - 32 mmol/L   Hemoglobin 14.6 13.0 - 17.0 g/dL   HCT 43.0 39.0 - 52.0 %  I-stat troponin, ED  Result Value Ref Range   Troponin i, poc 0.07 0.00 - 0.08 ng/mL   Comment 3          CBG monitoring, ED  Result Value Ref Range   Glucose-Capillary 122 (H) 65 - 99 mg/dL   Dg Chest 2 View  Result Date: 08/15/2017 CLINICAL DATA:  Persistent shortness of breath. Possible dehydration. EXAM: CHEST - 2 VIEW COMPARISON:  08/13/2017 radiographs. FINDINGS: The heart size and mediastinal contours are stable. There is aortic atherosclerosis. There is new patchy airspace disease at the left lung base. The right lung is clear. There is no pleural effusion or pneumothorax. Old right clavicle and rib fractures are noted. There are  degenerative changes throughout the spine. IMPRESSION: New patchy left basilar airspace disease suspicious for early pneumonia. Followup PA and lateral chest X-ray is recommended in 3-4 weeks following trial of antibiotic therapy to ensure resolution and exclude underlying malignancy. Electronically Signed   By: Richardean Sale M.D.   On: 08/15/2017 11:34   Ct Head Code Stroke Wo Contrast  Result Date: 09/14/2017 CLINICAL DATA:  Code stroke. 84 y/o M; right-sided weakness, facial droop, slurred speech. EXAM: CT HEAD WITHOUT CONTRAST TECHNIQUE: Contiguous axial images were obtained from the base of the skull through the vertex without intravenous contrast. COMPARISON:  None. FINDINGS: Brain: No evidence of acute infarction, hemorrhage, hydrocephalus, extra-axial collection or mass lesion/mass effect. Chronic microvascular ischemic changes and parenchymal volume loss of the brain. Vascular: Calcific atherosclerosis of vertebral arteries and carotid siphons. No hyperdense vessel identified. Skull: Normal. Negative for fracture or focal lesion. Sinuses/Orbits: No acute finding. Other: None. ASPECTS [facility] Stroke Program Early CT Score) - Ganglionic level infarction (caudate, lentiform nuclei, internal capsule, insula, M1-M3 cortex): 7 - Supraganglionic infarction (M4-M6 cortex): 3 Total score (0-10 with 10 being normal): 10 IMPRESSION: 1. No acute intracranial abnormality identified. 2. ASPECTS is 10 3. Chronic microvascular ischemic changes and parenchymal volume loss of the brain These results were communicated to Dr. Rory Percy at [place] 5/29/2019by text page via the [facility] messaging system. Electronically Signed   By: Kristine Garbe M.D.   On: 09/14/2017 04:18    EKG None  Radiology Ct Head Code Stroke Wo Contrast  Result Date: 09/14/2017 CLINICAL DATA:  Code stroke. 84 y/o M; right-sided weakness, facial droop, slurred speech. EXAM: CT HEAD WITHOUT CONTRAST TECHNIQUE: Contiguous axial images  were obtained from the base of the skull through the vertex without intravenous contrast. COMPARISON:  None. FINDINGS: Brain: No evidence of acute infarction, hemorrhage, hydrocephalus, extra-axial collection or mass lesion/mass effect. Chronic microvascular ischemic changes and parenchymal volume loss of the brain. Vascular: Calcific atherosclerosis of vertebral arteries and carotid siphons. No hyperdense vessel identified. Skull: Normal. Negative for fracture or focal lesion. Sinuses/Orbits: No acute finding. Other: None. ASPECTS [facility] Stroke Program Early CT Score) - Ganglionic level infarction (caudate, lentiform nuclei, internal capsule, insula, M1-M3 cortex): 7 - Supraganglionic infarction (M4-M6 cortex): 3 Total score (0-10 with 10 being normal): 10 IMPRESSION: 1. No acute intracranial abnormality identified. 2. ASPECTS is 10 3. Chronic microvascular ischemic changes and parenchymal volume loss of the brain These results were communicated to Dr. Rory Percy at [place] 5/29/2019by text page via the [facility] messaging system. Electronically Signed   By: Kristine Garbe M.D.   On: 09/14/2017 04:18    Procedures Procedures (including critical care time)  Medications Ordered in ED Medications  iopamidol (ISOVUE-370) 76 % injection (has no administration in time range)  iopamidol (ISOVUE-370) 76 % injection (has no administration in time range)  iopamidol (ISOVUE-370) 76 % injection 100 mL (100 mLs Intravenous Contrast Given 09/14/17 0412)       Final Clinical Impressions(s) / ED Diagnoses     Case d/w Dr. Carloyn Jaeger, please admit to medicine.     Arnie Clingenpeel, MD 09/14/17 [place], Nyema Hachey, MD 09/14/17 [id number]

## 2017-09-14 NOTE — ED Notes (Signed)
Dr. Reesa Chew on unit to see patient at this time.

## 2017-09-14 NOTE — Progress Notes (Signed)
PROGRESS NOTE    Walter [facility]  GBT:[id number] DOB: [birth year] DOA: 09/14/2017 PCP: Haywood Pao, MD   Patient admitted by Dr. Alcario Drought this morning for right-sided weakness.  Patient was found to have acute CVA. When I saw the patient this morning he was having worsening of his slurred speech and right upper extremity weakness.  Per the family and the patient this had originally started improving in the ER but it worsened this morning.  I spoke with Dr. Leonie Man from Neuro, who reviewed the MRI and recommended giving patient find her cc of normal saline bolus and continue maintenance IV fluid.  Also laying his head flat otherwise no other intervention.  General = no fevers, chills, dizziness, malaise, fatigue HEENT/EYES = negative for pain, redness, loss of vision, double vision, blurred vision, loss of hearing, sore throat, hoarseness, dysphagia Cardiovascular= negative for chest pain, palpitation, murmurs, lower extremity swelling Respiratory/lungs= negative for shortness of breath, cough, hemoptysis, wheezing, mucus production Gastrointestinal= negative for nausea, vomiting,, abdominal pain, melena, hematemesis Genitourinary= negative for Dysuria, Hematuria, Change in Urinary Frequency MSK = Negative for arthralgia, myalgias, Back Pain, Joint swelling  Neurology= slurred speech, right upper extremity weakness Psychiatry= Negative for anxiety, depression, suicidal and homocidal ideation Allergy/Immunology= Medication/Food allergy as listed  Skin= Negative for Rash, lesions, ulcers, itching  Vitals:   09/14/17 1100 09/14/17 1115  BP: 136/76 (!) 141/81  Pulse: 67 74  Resp: 19 15  SpO2: 97% 96%   Constitutional: NAD, calm, comfortable; elderly frail appearing.  Eyes: PERRL, lids and conjunctivae normal ENMT: Mucous membranes are moist. Posterior pharynx clear of any exudate or lesions.Normal dentition.  Neck: normal, supple, no masses, no thyromegaly Respiratory: clear to  auscultation bilaterally, no wheezing, no crackles. Normal respiratory effort. No accessory muscle use.  Cardiovascular: Regular rate and rhythm, no murmurs / rubs / gallops. No extremity edema. 2+ pedal pulses. No carotid bruits.  Abdomen: no tenderness, no masses palpated. No hepatosplenomegaly. Bowel sounds positive.  Musculoskeletal: no clubbing / cyanosis. No joint deformity upper and lower extremities. Good ROM, no contractures. Normal muscle tone.  Skin: no rashes, lesions, ulcers. No induration Neurologic: CN 2-12 grossly intact.  Slurred speech, 3/5 strenght in RUE, 4+/5 in rest of the ext.  Psychiatric: Normal judgment and insight. Alert and oriented x 3. Normal mood.   CT of the head is negative for any acute pathology MRI of the brain confirmed left basilar infarct with punctate infarct in the right frontal and posterior occipital lobe CTA of the head and neck was negative for any large vessel occlusion  Assessment and plan: Acute left basilar infarct/right frontal, posterior occipital lobe infarct Slurred speech and right upper extremity weakness -admit forTelemetry monitoring -Stroke protocol -Allow for permissive hypertension for the first 24-48h - only treat PRN if SBP >220 mmHg. Blood pressures can be gradually normalized to SBP<140 upon discharge. -MRI brain without contrast - shows stroke.  -Maintain Euthermia.  -ASA ordered. Pradaxa if patient able to take PO other will use Lovenox or heparin drip.  -Echocardiogram ordered -Lipid Panel, and A1C - pending -Frequent neuro checks -Atorvastatin PO within 24 hrs.  -Risk factor modification -Consult Neurology - appreciate input. Their team to see the patient soon.  -PT/OT eval, Speech consult  History of persistent atrial fibrillation -Antihypertensives on hold due to permissive hypertension.  Pradaxa on hold for now, will resume once cleared by speech and swallow  Essential hypertension -Antihypertensives on  hold.  Ankit Arsenio Loader, MD [facility] Pager [phone]  If 7PM-7AM, please contact night-coverage [email or web address] Password [facility] 09/14/2017, 11:56 AM

## 2017-09-14 NOTE — ED Notes (Addendum)
Pt NPO awaiting SLP Eval.

## 2017-09-14 NOTE — ED Notes (Signed)
Upon this rn attempting to administer rectal asa, Patient stating that he normally takes pradaxa and does not want aspirin because he feels that he needs something more than that as a blood thinner, this Rn advised patient I would page MD and make him aware of patient's concerns.

## 2017-09-14 NOTE — Evaluation (Signed)
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 MRI revealed an acute L basilar infarct, as well as R frontal, posterior and occipital lobe infarcts. PMH significant for vertigo, R clavicle fx, polyneuropathy, macular degeneration, L tibial fx, HTN, HOH, Barrett's esophagus, a-fib, AAA.   Clinical Impression  Pt admitted with above diagnosis. Pt currently with functional limitations due to the deficits listed below (see PT Problem List). At the time of PT eval pt was able to perform transfers with up to mod assist and RW for support. Pt will require +2 for safe mobility progression next session. Feel this patient is a great candidate for continued therapy at the CIR level. Acutely, pt will benefit from skilled PT to increase their independence and safety with mobility to allow discharge to the venue listed below.       Follow Up Recommendations CIR;Supervision/Assistance - 24 hour    Equipment Recommendations  Other (comment)(TBD by next venue of care)    Recommendations for Other Services Rehab consult     Precautions / Restrictions Precautions Precautions: Fall Restrictions Weight Bearing Restrictions: No      Mobility  Bed Mobility Overal bed mobility: Needs Assistance Bed Mobility: Supine to Sit;Sit to Supine     Supine to sit: Min assist Sit to supine: Mod assist   General bed mobility comments: Assist for trunk elevation to full sitting position, and at end of session to elevate LE's back up into bed. With bed in trendelenberg, pt was able to scoot himself up to [facility].   Transfers Overall transfer level: Needs assistance Equipment used: Rolling walker (2 wheeled) Transfers: Sit to/from Stand Sit to Stand: Mod assist         General transfer comment: VC's for hand placement on  seated surface for safety. Pt unable to push up to stand and grabbed walker to pull up. Therapist anchored walker and provided mod assist to power-up to full standing position. Pt required assist to adjust foot positioning on the R. He was able to attempt weight shifting but was minimally shifting weight to the R.   Ambulation/Gait             General Gait Details: Not tested  Stairs            Wheelchair Mobility    Modified Rankin (Stroke Patients Only) Modified Rankin (Stroke Patients Only) Pre-Morbid Rankin Score: No significant disability Modified Rankin: Moderately severe disability     Balance Overall balance assessment: Needs assistance Sitting-balance support: Feet supported;Bilateral upper extremity supported Sitting balance-Leahy Scale: Poor Sitting balance - Comments: Posterior lean and poor trunk control noted in sitting, especially during MMT of LE's Postural control: Posterior lean;Right lateral lean Standing balance support: Bilateral upper extremity supported;During functional activity Standing balance-Leahy Scale: Poor Standing balance comment: Reliant on UE support on walker for standing balance                             Pertinent Vitals/Pain Pain Assessment: No/denies pain Faces Pain Scale: No hurt    Home Living Family/patient expects to be discharged to:: Inpatient rehab                 Additional Comments: Pt is from home with wife who is available 24 hours at d/c.  Prior Function Level of Independence: Independent with assistive device(s)               Hand Dominance   Dominant Hand: Right    Extremity/Trunk Assessment   Upper Extremity Assessment Upper Extremity Assessment: RUE deficits/detail;LUE deficits/detail RUE Deficits / Details: Decreased strength and AROM consistent with above. When testing grip, felt almost equal to L, however noted pt had functional weakness. For example, pt had difficulty  maintaining grip on the walker and had difficulty pushing down into bed when scooting out to EOB.  RUE Sensation: WNL LUE Deficits / Details: Chronic L shoulder injury per pt.    Lower Extremity Assessment Lower Extremity Assessment: RLE deficits/detail;LLE deficits/detail RLE Deficits / Details: MMT: 4-/5 quads, 5/5 hamstrings, 2+/5 hip flexor. Was barely able to lift knee against gravity. Peripheral neuropathy - all of foot and most of lower leg to knee with decreased sensation, and pt reports an area at the lateral quad that is completely numb at baseline.  RLE Sensation: history of peripheral neuropathy LLE Deficits / Details: 5/5 strength in hamstrings and quad, and 4/5 strength in the hip flexor. Peripheral neuropathy - all of foot and most of lower leg to knee with decreased sensation.  LLE Sensation: history of peripheral neuropathy    Cervical / Trunk Assessment Cervical / Trunk Assessment: Other exceptions Cervical / Trunk Exceptions: Forward head posture with rounded shoulders in sitting and standing.   Communication   Communication: HOH  Cognition Arousal/Alertness: Awake/alert Behavior During Therapy: Anxious(At times regarding mobility) Overall Cognitive Status: Within Functional Limits for tasks assessed                                        General Comments      Exercises     Assessment/Plan    PT Assessment Patient needs continued PT services  PT Problem List Decreased strength;Decreased range of motion;Decreased activity tolerance;Decreased balance;Decreased mobility;Decreased knowledge of use of DME;Decreased safety awareness;Decreased knowledge of precautions;Decreased coordination;Impaired sensation;Impaired tone       PT Treatment Interventions DME instruction;Gait training;Stair training;Functional mobility training;Therapeutic activities;Therapeutic exercise;Neuromuscular re-education;Patient/family education    PT Goals (Current goals  can be found in the Care Plan section)  Acute Rehab PT Goals Patient Stated Goal: Return to PLOF PT Goal Formulation: With patient/family Time For Goal Achievement: 09/28/17 Potential to Achieve Goals: Good    Frequency Min 4X/week   Barriers to discharge        Co-evaluation               AM-PAC PT "6 Clicks" Daily Activity  Outcome Measure Difficulty turning over in bed (including adjusting bedclothes, sheets and blankets)?: Unable Difficulty moving from lying on back to sitting on the side of the bed? : Unable Difficulty sitting down on and standing up from a chair with arms (e.g., wheelchair, bedside commode, etc,.)?: Unable Help needed moving to and from a bed to chair (including a wheelchair)?: A Lot Help needed walking in hospital room?: Total Help needed climbing 3-5 steps with a railing? : Total 6 Click Score: 7    End of Session Equipment Utilized During Treatment: Gait belt Activity Tolerance: Patient tolerated treatment well Patient left: in bed;with call bell/phone within reach;with family/visitor present Nurse Communication: Mobility status PT Visit Diagnosis: Unsteadiness on feet (R26.81);Hemiplegia and hemiparesis;Other symptoms and signs involving the nervous system (R29.898) Hemiplegia - Right/Left: Right Hemiplegia -  dominant/non-dominant: Dominant Hemiplegia - caused by: Cerebral infarction    Time: 1351-1426 PT Time Calculation (min) (ACUTE ONLY): 35 min   Charges:   PT Evaluation $PT Eval Moderate Complexity: 1 Mod PT Treatments $Therapeutic Activity: 8-22 mins   PT G Codes:        Rolinda Roan, PT, DPT Acute Rehabilitation Services Pager: [phone]   Thelma Comp 09/14/2017, 2:58 PM

## 2017-09-14 NOTE — Progress Notes (Signed)
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  EMS was called.  Upon initial assessment, he had some right grip weakness within the next 15 to 20 minutes, his weakness progressed to involve his right face arm and leg.  His speech became very slurred.  He was still able to follow all commands.  His systolic blood pressures remain in the 150s and his heart rate remained in the 50s.There was no report of seizure-like activity or gaze preference.  Patient has documented history of TGA's and the wife did not report this to be similar to his prior TGAs.On initial assessment, he had near hemiplegia on the right arm and leg and right facial droop with moderate to severe dysarthria.  No gaze preference or deviation noted.  No neglect.  No aphasia. LKW: 10 PM on 09/05/2017 tpa given?: no, outside the window, is on Pradaxa-last dose 09/13/2017 Premorbid modified Rankin scale (mRS): 3   SUBJECTIVE  patient is accompanied  by his wife and daughter in the room. I have reviewed the history of presenting illness in details with the patient.He has had a fluctuating course with transient worsening and improvement in his symptoms. He states he was quite compliant with his Pradaxa and did not miss a single dose.   OBJECTIVE Most recent Vital Signs: BP: 139/93 (05/29 1630) Pulse Rate: 65 (05/29 1646) Respiratory Rate: 18 O2 Saturdation: 92%  CBG (last 3)  Recent Labs    09/14/17 0401  GLUCAP 122*       Studies:  CT brain : no acute abnormality MRI Brain acute left basal ganglia infarct.Punctate acute infarcts in right frontal and posterior  occipital lobes as well. Mild changes of chronic small vessel disease. CTAs no significant extracranial carotid or vertebral stenosis. No significant large vessel intracranial stenosis. 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 ECHO 07/22/99 ; normal systolic function with ejection fraction 55-60%. Mitral annulus calcification. LDLpending HbA1cpending  Physical Exam:    Pleasant elderly Caucasian male currently not in distress . Afebrile. Head is nontraumatic. Neck is supple without bruit.    Cardiac exam no murmur or gallop. Lungs are clear to auscultation. Distal pulses are well felt. Neurological Exam ;  Awake  Alert oriented x 3. Mildly dysarthric speech but normal language.eye movements full without nystagmus.fundi were not visualized. Vision acuity and fields appear normal. Hearing is normal. Palatal movements are normal. Face asymmetric with subtle right lower face weakness. Tongue midline. Normal strength, tone, reflexes and coordination except diminished fine finger movements on the right. Mild right grip weakness. Orbits left over right approximately.. Normal sensation. Gait deferred.  ASSESSMENT Mr. Walter Bryant is a 84 y.o. male with  Atrial fibrillation with small bilateral cardioembolic infarct despite anticoagulation with Pradaxa.   Hospital day # 0  TREATMENT/PLAN  I have personally examined this patient, reviewed notes, independently viewed imaging studies, participated in medical decision making and plan of care.ROS completed by me personally and pertinent positives fully documented  I have made any additions or clarifications directly to the above note.  I had a long discussion with the patient, wife and daughter at the  bedside regarding his embolic strokes despite anticoagulation on Pradaxa. I explained to them that there is no definite data suggesting switching Pradaxa to eliquis on Xarelto may be any superior. Patient seems comfortable taking Pradaxa which is  tolerating well and would like to continue. Resume Pradaxa if patient can swallow safely if not may need IV heparin drip. Continue ongoing stroke workup. Physical occupational therapy consults. Discussed with Dr. Reesa Chew. Greater than 50% time during this 35 minute visit was spent on counseling and coordination of care about his embolic strokes and discussion about options of anticoagulation and alternatives and answering questions   Antony Contras, [place] Pager: [phone] 09/14/2017 5:21 PM

## 2017-09-14 NOTE — H&P (Signed)
History and Physical    Walter Bryant YNW:[id number] DOB: 11/16/1930 DOA: 09/14/2017  PCP: Haywood Pao, MD  Patient coming from: Home  I have personally briefly reviewed patient's old medical records in [place]  Chief Complaint: R sided weakness, slurred speech  HPI: Walter Bryant is a 84 y.o. male with medical history significant of A.Fib on pradaxa (last dose yesterday evening before bed), AAA, HTN, TGA's in past.  Patient went to bed LKW 10pm last evening.  Woke up this AM around 3am to use bathroom and realized he had R arm weakness.  EMS called.  Initially had some R grip weakness.  Progressed over next 15-20 min to involve R face, arm, leg, and developed very slurred speech.  Still able to follow all commands.  SBP 150s, HR 50s.  No seizure like activity.  ED Course: CTA shows no emergent large vessel occlusion.  Perfusion study has poor contrast timing.  No bleed on CT head.  Not given TPA due to 1) being out of window with LKW at 10pm and 2) he is on pradaxa anyhow and he and wife confirms him taking it last evening.   Review of Systems: As per HPI otherwise 10 point review of systems negative.   Past Medical History:  Diagnosis Date  . A-fib ([place])   . AAA (abdominal aortic aneurysm) ([place])   . Abnormality of gait 12/20/2013  . Anxiety and depression   . Anxiety and depression   . Arthritis   . Barrett's esophagus   . BPH (benign prostatic hyperplasia)   . Degenerative arthritis   . GERD (gastroesophageal reflux disease)   . History of concussion   . HOH (hard of hearing)   . Hyperlipidemia   . Hypertension   . Left tibial fracture   . Macular degeneration Left  . Polyneuropathy in other diseases classified elsewhere ([place]) 12/20/2013  . Right clavicle fracture   . Spinal stenosis   . Transient global amnesia   . Vertigo     Past Surgical History:  Procedure Laterality Date  . ABDOMINAL AORTIC ANEURYSM REPAIR  2016   stent placement  .  APPENDECTOMY    . CATARACT EXTRACTION     Bilateral  . KNEE SURGERY Bilateral    both  knees replaced  . LAPAROSCOPIC RIGHT HEMI COLECTOMY    . LUMBAR LAMINECTOMY     L4   . REFRACTIVE SURGERY Left    macular degeneration  . TONSILLECTOMY AND ADENOIDECTOMY    . TOTAL HIP ARTHROPLASTY Right    x 2  . WRIST FRACTURE SURGERY Right      reports that he quit smoking about 51 years ago. He has never used smokeless tobacco. He reports that he does not drink alcohol or use drugs.  Allergies  Allergen Reactions  . Other     vicroyl sutures  . Morphine   . Oxycodone     Family History  Problem Relation Age of Onset  . Heart disease Sister        AFIB  . Neuropathy Neg Hx      Prior to Admission medications   Medication Sig Start Date End Date Taking? Authorizing Provider  ALPRAZolam (XANAX) 0.25 MG tablet Take 1 tablet (0.25 mg total) by mouth daily as needed for anxiety. 08/17/17   Elwyn Reach, MD  Ascorbic Acid (VITAMIN C) 1000 MG tablet Take 1,000 mg by mouth daily.    [provider]  aspirin 81 MG tablet  Take 81 mg by mouth at bedtime.     [provider]  Calcium-Magnesium-Vitamin D (CALCIUM 500 PO) Take 1 tablet by mouth daily.    [provider]  dabigatran (PRADAXA) 150 MG CAPS capsule Take 150 mg by mouth 2 (two) times daily.    [provider]  fexofenadine (ALLEGRA) 180 MG tablet Take 180 mg by mouth every morning.    [provider]  fluticasone (FLONASE) 50 MCG/ACT nasal spray Place 2 sprays into both nostrils daily for 7 days. 08/13/17 08/20/17  Long, Wonda Olds, MD  furosemide (LASIX) 20 MG tablet Take 1 tablet (20 mg total) by mouth daily. 08/17/17   Elwyn Reach, MD  ibuprofen (ADVIL,MOTRIN) 200 MG tablet Take by mouth. 600 mg each morning.    [provider]  lisinopril (PRINIVIL,ZESTRIL) 5 MG tablet Take 5 mg by mouth daily after lunch.     [provider]  LUTEIN PO Take 25 mg by mouth daily  after lunch.     [provider]  metoprolol succinate (TOPROL-XL) 50 MG 24 hr tablet Take 50 mg by mouth 2 (two) times daily. Take with or immediately following a meal.     [provider]  Multiple Vitamin (MULTIVITAMIN) tablet Take 1 tablet by mouth daily.    [provider]  Omega-3 Fatty Acids (OMEGA 3 PO) Take 1 tablet by mouth daily after lunch.     [provider]  omeprazole (PRILOSEC) 20 MG capsule Take 20 mg by mouth every morning.     [provider]  POTASSIUM PO Take 1 tablet by mouth daily after lunch.     [provider]  pregabalin (LYRICA) 75 MG capsule Take 75 mg by mouth 4 (four) times daily as needed.    [provider]  ranitidine (ZANTAC) 150 MG tablet Take 150 mg by mouth 2 (two) times daily. Take in the afternoon and bedtime    [provider]  Selenium 200 MCG TABS Take 1 tablet by mouth daily after lunch.     [provider]  simvastatin (ZOCOR) 20 MG tablet Take 20 mg by mouth every morning.  01/09/13   [provider]  sodium chloride (OCEAN) 0.65 % SOLN nasal spray Place 1 spray into both nostrils every morning.    [provider]  tamsulosin (FLOMAX) 0.4 MG CAPS capsule Take 0.4 mg by mouth at bedtime.     [provider]  zinc gluconate 50 MG tablet Take 50 mg by mouth every morning.     [provider]    Physical Exam: Vitals:   09/14/17 0411 09/14/17 0430 09/14/17 0500  BP: (!) 148/91 (!) 142/92 (!) 157/100  Pulse: 72 73 78  Resp: 12 15 18   SpO2: 96% 98% 98%    Constitutional: NAD, calm, comfortable Eyes: PERRL, lids and conjunctivae normal ENMT: Mucous membranes are moist. Posterior pharynx clear of any exudate or lesions.Normal dentition.  Neck: normal, supple, no masses, no thyromegaly Respiratory: clear to auscultation bilaterally, no wheezing, no crackles. Normal respiratory effort. No accessory muscle use.  Cardiovascular: Regular  rate and rhythm, no murmurs / rubs / gallops. No extremity edema. 2+ pedal pulses. No carotid bruits.  Abdomen: no tenderness, no masses palpated. No hepatosplenomegaly. Bowel sounds positive.  Musculoskeletal: no clubbing / cyanosis. No joint deformity upper and lower extremities. Good ROM, no contractures. Normal muscle tone.  Skin: no rashes, lesions, ulcers. No induration Neurologic: Slurred speech, initially near hemiplegia on R arm  and Leg, waxing and waning symptoms however Psychiatric: Normal judgment and insight. Alert and oriented x 3. Normal mood.    Labs on Admission: I have personally reviewed following labs and imaging studies  CBC: Recent Labs  Lab 09/14/17 0359 09/14/17 0406  WBC 5.8  --   NEUTROABS 3.6  --   HGB 14.6 14.6  HCT 43.5 43.0  MCV 92.0  --   PLT 143*  --    Basic Metabolic Panel: Recent Labs  Lab 09/14/17 0359 09/14/17 0406  NA 132* 131*  K 4.5 4.3  CL 98* 96*  CO2 25  --   GLUCOSE 122* 117*  BUN 18 18  CREATININE 1.03 1.00  CALCIUM 9.3  --    GFR: CrCl cannot be calculated (Unknown ideal weight.). Liver Function Tests: Recent Labs  Lab 09/14/17 0359  AST 29  ALT 23  ALKPHOS 58  BILITOT 1.0  PROT 6.1*  ALBUMIN 3.5   No results for input(s): LIPASE, AMYLASE in the last 168 hours. No results for input(s): AMMONIA in the last 168 hours. Coagulation Profile: Recent Labs  Lab 09/14/17 0359  INR 1.38   Cardiac Enzymes: No results for input(s): CKTOTAL, CKMB, CKMBINDEX, TROPONINI in the last 168 hours. BNP (last 3 results) No results for input(s): PROBNP in the last 8760 hours. HbA1C: No results for input(s): HGBA1C in the last 72 hours. CBG: Recent Labs  Lab 09/14/17 0401  GLUCAP 122*   Lipid Profile: No results for input(s): CHOL, HDL, LDLCALC, TRIG, CHOLHDL, LDLDIRECT in the last 72 hours. Thyroid Function Tests: No results for input(s): TSH, T4TOTAL, FREET4, T3FREE, THYROIDAB in the last 72 hours. Anemia Panel: No  results for input(s): VITAMINB12, FOLATE, FERRITIN, TIBC, IRON, RETICCTPCT in the last 72 hours. Urine analysis:    Component Value Date/Time   COLORURINE YELLOW 09/14/2017 0420   APPEARANCEUR CLEAR 09/14/2017 0420   LABSPEC 1.008 09/14/2017 0420   PHURINE 7.0 09/14/2017 0420   GLUCOSEU NEGATIVE 09/14/2017 0420   HGBUR NEGATIVE 09/14/2017 0420   BILIRUBINUR NEGATIVE 09/14/2017 0420   KETONESUR NEGATIVE 09/14/2017 0420   PROTEINUR NEGATIVE 09/14/2017 0420   NITRITE NEGATIVE 09/14/2017 0420   LEUKOCYTESUR NEGATIVE 09/14/2017 0420    Radiological Exams on Admission: Ct Angio Head W Or Wo Contrast  Result Date: 09/14/2017 CLINICAL DATA:  84 y/o M; right-sided weakness, facial droop, slurred speech. EXAM: CT ANGIOGRAPHY HEAD AND NECK CT PERFUSION BRAIN TECHNIQUE: Multidetector CT imaging of the head and neck was performed using the standard protocol during bolus administration of intravenous contrast. Multiplanar CT image reconstructions and MIPs were obtained to evaluate the vascular anatomy. Carotid stenosis measurements (when applicable) are obtained utilizing NASCET criteria, using the distal internal carotid diameter as the denominator. Multiphase CT imaging of the brain was performed following IV bolus contrast injection. Subsequent parametric perfusion maps were calculated using RAPID software. CONTRAST:  19mL ISOVUE-370 IOPAMIDOL (ISOVUE-370) INJECTION 76% COMPARISON:  09/08/2017 CT head. FINDINGS: CTA NECK FINDINGS Aortic arch: Standard branching. Imaged portion shows no evidence of aneurysm or dissection. No significant stenosis of the major arch vessel origins. Moderate calcific atherosclerosis of aortic arch. Right carotid system: No evidence of dissection, stenosis (50% or greater) or occlusion. Non stenotic calcific atherosclerosis of the carotid bifurcation. Left carotid system: No evidence of dissection, stenosis (50% or greater) or occlusion. Non stenotic calcific atherosclerosis of  the carotid bifurcation. Vertebral arteries: Left dominant. No evidence of dissection, stenosis (50% or greater) or occlusion. Skeleton: Moderate cervical spondylosis with multilevel disc and  facet degenerative changes. No high-grade bony canal stenosis. Other neck: Left submandibular gland atrophy. Upper chest: Negative. 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 Calcific atherosclerosis of the carotid siphons with mild bilateral paraclinoid stenosis. Posterior circulation: No significant stenosis, proximal occlusion, aneurysm, or vascular malformation. Calcific atherosclerosis of vertebral arteries of mild right V4 stenosis. Venous sinuses: As permitted by contrast timing, patent. Anatomic variants: Patent anterior communicating artery. No posterior communicating artery identified, likely hypoplastic or absent. Review of the MIP images confirms the above findings CT Brain Perfusion Findings: CBF (<30%) Volume: 42mL Perfusion (Tmax>6.0s) volume: 84mL Mismatch Volume: NA. Infarction Location:The AIF and VOF curves had a slow down slope and did not reach equilibrium within scan time. Quantification may be mildly inaccurate, however, there is no asymmetric perfusion abnormality to indicate infarction. IMPRESSION: 1. Patent carotid and vertebral arteries. No dissection, aneurysm, or hemodynamically significant stenosis utilizing NASCET criteria. 2. Patent anterior and posterior intracranial circulation. No large vessel occlusion or high-grade stenosis. 3. 5 mm anterior communicating artery aneurysm. 4. No evidence for focal acute stroke on perfusion imaging. These results were called by telephone at the time of interpretation on 09/14/2017 at 5:00 am to Dr. Amie Portland , who verbally acknowledged these results. Electronically Signed   By: Kristine Garbe M.D.    On: 09/14/2017 05:10   Ct Angio Neck W Or Wo Contrast  Result Date: 09/14/2017 CLINICAL DATA:  84 y/o M; right-sided weakness, facial droop, slurred speech. EXAM: CT ANGIOGRAPHY HEAD AND NECK CT PERFUSION BRAIN TECHNIQUE: Multidetector CT imaging of the head and neck was performed using the standard protocol during bolus administration of intravenous contrast. Multiplanar CT image reconstructions and MIPs were obtained to evaluate the vascular anatomy. Carotid stenosis measurements (when applicable) are obtained utilizing NASCET criteria, using the distal internal carotid diameter as the denominator. Multiphase CT imaging of the brain was performed following IV bolus contrast injection. Subsequent parametric perfusion maps were calculated using RAPID software. CONTRAST:  140mL ISOVUE-370 IOPAMIDOL (ISOVUE-370) INJECTION 76% COMPARISON:  09/08/2017 CT head. FINDINGS: CTA NECK FINDINGS Aortic arch: Standard branching. Imaged portion shows no evidence of aneurysm or dissection. No significant stenosis of the major arch vessel origins. Moderate calcific atherosclerosis of aortic arch. Right carotid system: No evidence of dissection, stenosis (50% or greater) or occlusion. Non stenotic calcific atherosclerosis of the carotid bifurcation. Left carotid system: No evidence of dissection, stenosis (50% or greater) or occlusion. Non stenotic calcific atherosclerosis of the carotid bifurcation. Vertebral arteries: Left dominant. No evidence of dissection, stenosis (50% or greater) or occlusion. Skeleton: Moderate cervical spondylosis with multilevel disc and facet degenerative changes. No high-grade bony canal stenosis. Other neck: Left submandibular gland atrophy. Upper chest: Negative. 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 Calcific atherosclerosis of the carotid  siphons with mild bilateral paraclinoid stenosis. Posterior circulation: No significant stenosis, proximal occlusion, aneurysm, or vascular malformation. Calcific atherosclerosis of vertebral arteries of mild right V4 stenosis. Venous sinuses: As permitted by contrast timing, patent. Anatomic variants: Patent anterior communicating artery. No posterior communicating artery identified, likely hypoplastic or absent. Review of the MIP images confirms the above findings CT Brain Perfusion Findings: CBF (<30%) Volume: 79mL Perfusion (Tmax>6.0s) volume: 8mL Mismatch Volume: NA.  Infarction Location:The AIF and VOF curves had a slow down slope and did not reach equilibrium within scan time. Quantification may be mildly inaccurate, however, there is no asymmetric perfusion abnormality to indicate infarction. IMPRESSION: 1. Patent carotid and vertebral arteries. No dissection, aneurysm, or hemodynamically significant stenosis utilizing NASCET criteria. 2. Patent anterior and posterior intracranial circulation. No large vessel occlusion or high-grade stenosis. 3. 5 mm anterior communicating artery aneurysm. 4. No evidence for focal acute stroke on perfusion imaging. These results were called by telephone at the time of interpretation on 09/14/2017 at 5:00 am to Dr. Amie Portland , who verbally acknowledged these results. Electronically Signed   By: Kristine Garbe M.D.   On: 09/14/2017 05:10   Ct Cerebral Perfusion W Contrast  Result Date: 09/14/2017 CLINICAL DATA:  84 y/o M; right-sided weakness, facial droop, slurred speech. EXAM: CT ANGIOGRAPHY HEAD AND NECK CT PERFUSION BRAIN TECHNIQUE: Multidetector CT imaging of the head and neck was performed using the standard protocol during bolus administration of intravenous contrast. Multiplanar CT image reconstructions and MIPs were obtained to evaluate the vascular anatomy. Carotid stenosis measurements (when applicable) are obtained utilizing NASCET criteria, using  the distal internal carotid diameter as the denominator. Multiphase CT imaging of the brain was performed following IV bolus contrast injection. Subsequent parametric perfusion maps were calculated using RAPID software. CONTRAST:  168mL ISOVUE-370 IOPAMIDOL (ISOVUE-370) INJECTION 76% COMPARISON:  09/08/2017 CT head. FINDINGS: CTA NECK FINDINGS Aortic arch: Standard branching. Imaged portion shows no evidence of aneurysm or dissection. No significant stenosis of the major arch vessel origins. Moderate calcific atherosclerosis of aortic arch. Right carotid system: No evidence of dissection, stenosis (50% or greater) or occlusion. Non stenotic calcific atherosclerosis of the carotid bifurcation. Left carotid system: No evidence of dissection, stenosis (50% or greater) or occlusion. Non stenotic calcific atherosclerosis of the carotid bifurcation. Vertebral arteries: Left dominant. No evidence of dissection, stenosis (50% or greater) or occlusion. Skeleton: Moderate cervical spondylosis with multilevel disc and facet degenerative changes. No high-grade bony canal stenosis. Other neck: Left submandibular gland atrophy. Upper chest: Negative. 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 Calcific atherosclerosis of the carotid siphons with mild bilateral paraclinoid stenosis. Posterior circulation: No significant stenosis, proximal occlusion, aneurysm, or vascular malformation. Calcific atherosclerosis of vertebral arteries of mild right V4 stenosis. Venous sinuses: As permitted by contrast timing, patent. Anatomic variants: Patent anterior communicating artery. No posterior communicating artery identified, likely hypoplastic or absent. Review of the MIP images confirms the above findings CT Brain Perfusion Findings: CBF (<30%) Volume: 40mL Perfusion (Tmax>6.0s) volume: 9mL  Mismatch Volume: NA. Infarction Location:The AIF and VOF curves had a slow down slope and did not reach equilibrium within scan time. Quantification may be mildly inaccurate, however, there is no asymmetric perfusion abnormality to indicate infarction. IMPRESSION: 1. Patent carotid and vertebral arteries. No dissection, aneurysm, or hemodynamically significant stenosis utilizing NASCET criteria. 2. Patent anterior and posterior intracranial circulation. No large vessel occlusion or high-grade stenosis. 3. 5 mm anterior communicating artery aneurysm. 4. No evidence for focal acute stroke on perfusion imaging. These results were called by telephone at the time of interpretation on 09/14/2017 at 5:00 am to Dr. Amie Portland , who verbally acknowledged these results. Electronically Signed   By: Kristine Garbe M.D.   On: 09/14/2017 05:10   Ct Head Code Stroke Wo Contrast  Result Date: 09/14/2017 CLINICAL DATA:  Code stroke. 84 y/o M; right-sided weakness, facial droop, slurred speech. EXAM: CT HEAD WITHOUT CONTRAST TECHNIQUE: Contiguous axial images were obtained from the base of the skull through the vertex without intravenous contrast. COMPARISON:  None. FINDINGS: Brain: No evidence of acute infarction, hemorrhage, hydrocephalus, extra-axial collection or mass lesion/mass effect. Chronic microvascular ischemic changes and parenchymal volume loss of the brain. Vascular: Calcific atherosclerosis of vertebral arteries and carotid siphons. No hyperdense vessel identified. Skull: Normal. Negative for fracture or focal lesion. Sinuses/Orbits: No acute finding. Other: None. ASPECTS [facility] Stroke Program Early CT Score) - Ganglionic level infarction (caudate, lentiform nuclei, internal capsule, insula, M1-M3 cortex): 7 - Supraganglionic infarction (M4-M6 cortex): 3 Total score (0-10 with 10 being normal): 10 IMPRESSION: 1. No acute intracranial abnormality identified. 2. ASPECTS is 10 3. Chronic microvascular  ischemic changes and parenchymal volume loss of the brain These results were communicated to Dr. Rory Percy at [place] 5/29/2019by text page via the [facility] messaging system. Electronically Signed   By: Kristine Garbe M.D.   On: 09/14/2017 04:18    EKG: Independently reviewed.  Assessment/Plan Principal Problem:   Acute ischemic stroke (HCC) Active Problems:   HTN (hypertension)   A-fib ([place])    1. Acute ischemic stroke - 1. Stroke pathway 2. MRI brain 3. 2d Echo 4. PT/OT/SLP 5. NPO till passes swallow 6. ASA 325, hold pradaxa till MRI done 7. Neuro consult (see Dr. Rory Percy note) 8. Tele monitor 2. HTN - 1. Hold all home BP meds and allow permissive HTN.  Especially given waxing and waning symptoms ongoing even at time of exam. 2. Watch for urinary retention when holding flomax (cath if needed) 3. A.Fib - 1. Hold pradaxa until MRI done per neuro recs 2. Holding metoprolol to try and allow permissive HTN 3. Tele monitor 4. Watch for development of RVR and treat if needed. 5. But do want to give BP chance to rise and perfuse penumbra especially given the waxing and waning stroke symptoms ongoing in ED.  DVT prophylaxis: ASA and SCDs (also theraputic on pradaxa for the moment) Code Status: Full Family Communication: Wife at bedside, Daughter on phone Disposition Plan: TBD Consults called: Neuro Admission status: Admit to inpatient   [place] [place] [facility] Pager [phone]  If 7AM-7PM, please contact day team taking care of patient [email or web address] Password [facility]  09/14/2017, 6:05 AM

## 2017-09-14 NOTE — ED Notes (Signed)
Walter Bryant w/ Speech Therapy called back, she stated that they currently have 20 cases of NPO patients to get to. She made no promises of getting to pt today. Almyra Free RN made aware.

## 2017-09-14 NOTE — ED Notes (Signed)
Attempted to call speech therapy, unable to reach anyone at this time

## 2017-09-14 NOTE — ED Notes (Signed)
Per request of Maurine Minister, this NS was able to reach Speech Therapy at 1243, spoke w/ Abigail Butts. Made her aware that there is a STAT request for a Stroke Swallow Screen. Abigail Butts stated she will try to have someone there at 1pm.

## 2017-09-14 NOTE — Progress Notes (Signed)
Inpatient Rehabilitation Admissions Coordinator    PT and OT are recommending an inpt rehab consult. Please place order for an inpt rehab conus lt if pt would like to be considered for admit.  Danne Baxter, RN, MSN Rehab Admissions Coordinator [phone] 09/14/2017 6:45 PM

## 2017-09-15 ENCOUNTER — Inpatient Hospital Stay (HOSPITAL_COMMUNITY): Payer: Medicare Other

## 2017-09-15 ENCOUNTER — Other Ambulatory Visit: Payer: Self-pay

## 2017-09-15 DIAGNOSIS — I639 Cerebral infarction, unspecified: Secondary | ICD-10-CM

## 2017-09-15 LAB — ECHOCARDIOGRAM LIMITED
HEIGHTINCHES: 74 in
WEIGHTICAEL: 3456.81 [oz_av]

## 2017-09-15 LAB — LIPID PANEL
CHOL/HDL RATIO: 2.7 ratio
Cholesterol: 119 mg/dL (ref 0–200)
HDL: 44 mg/dL (ref >40)
LDL CALC: 67 mg/dL (ref 0–99)
Triglycerides: 42 mg/dL (ref <150)
VLDL: 8 mg/dL (ref 0–40)

## 2017-09-15 LAB — HEMOGLOBIN A1C
Hgb A1c MFr Bld: 5.2 % (ref 4.8–5.6)
MEAN PLASMA GLUCOSE: 102.54 mg/dL

## 2017-09-15 MED ORDER — ALPRAZOLAM 0.5 MG PO TABS
0.5000 mg | ORAL_TABLET | Freq: Three times a day (TID) | ORAL | Status: DC | PRN
  Administered 2017-09-17 – 2017-09-18 (×3): 0.5 mg via ORAL
  Filled 2017-09-15 (×3): qty 1

## 2017-09-15 MED ORDER — FAMOTIDINE IN NACL 20-0.9 MG/50ML-% IV SOLN
20.0000 mg | Freq: Two times a day (BID) | INTRAVENOUS | Status: DC
  Administered 2017-09-15 – 2017-09-17 (×5): 20 mg via INTRAVENOUS
  Filled 2017-09-15 (×6): qty 50

## 2017-09-15 MED ORDER — ATORVASTATIN CALCIUM 80 MG PO TABS: 80.0000 mg | ORAL_TABLET | Freq: Every day | ORAL | 0 refills | Status: DC

## 2017-09-15 MED ORDER — SODIUM CHLORIDE 0.9 % IV BOLUS
1000.0000 mL | Freq: Once | INTRAVENOUS | Status: AC
  Administered 2017-09-15: 1000 mL via INTRAVENOUS

## 2017-09-15 MED ORDER — LORAZEPAM 2 MG/ML IJ SOLN
0.5000 mg | Freq: Four times a day (QID) | INTRAMUSCULAR | Status: DC | PRN
  Administered 2017-09-15 – 2017-09-18 (×8): 0.5 mg via INTRAVENOUS
  Filled 2017-09-15 (×9): qty 1

## 2017-09-15 MED ORDER — TRAZODONE HCL 50 MG PO TABS: 50.0000 mg | ORAL_TABLET | Freq: Every evening | ORAL | Status: DC | PRN

## 2017-09-15 MED ORDER — HEPARIN (PORCINE) IN NACL 100-0.45 UNIT/ML-% IJ SOLN
1250.0000 [IU]/h | INTRAMUSCULAR | Status: DC
  Administered 2017-09-15: 1200 [IU]/h via INTRAVENOUS
  Filled 2017-09-15 (×3): qty 250

## 2017-09-15 MED ORDER — SODIUM CHLORIDE 0.9 % IV SOLN: 500.0000 mL | Freq: Once | INTRAVENOUS | Status: DC

## 2017-09-15 NOTE — Progress Notes (Signed)
Modified Barium Swallow Progress Note  Patient Details  Name: Walter Bryant MRN: [id number] Date of Birth: 1931-03-16  Today's Date: 09/15/2017  Modified Barium Swallow completed.  Full report located under Chart Review in the Imaging Section.  Brief recommendations include the following:  Clinical Impression  Pt presents with a functional oropharyngeal swallow with adequate mastication, inconsistent penetration of thin liquids (Penetration-Aspiration Scale rating of #2 - penetrate is above the vocal cords and ejected from larynx upon completion of the swallow).  There was no aspiration, despite consumption of large, consecutive thin liquid boluses.  13 mm barium pill transfered through pharynx and esophagus without delay.  Recommend continuing a regular diet, advance liquids to thin, meds to be consumed with liquids.  No further SLP f/u for swallowing; speech/language evaluation pending.    Swallow Evaluation Recommendations     Regular diet, thin liquids; meds whole in liquid.                          Oral Care Recommendations: Oral care BID        [name] 09/15/2017,12:59 PM

## 2017-09-15 NOTE — Progress Notes (Signed)
Inpatient Rehabilitation-Admissions Coordinator    Spoke with wife and pt at the bedside to discuss recommendations for inpatient rehab. Pt and wife interested in CIR. AC provided booklets and discussed estimated LOS and expectations for rehab. Pt is not medically ready for CIR today; Will continue to follow for medical readiness and bed availability. Hopeful for admission tomorrow. Call if questions.   Jhonnie Garner, OTR/L  Rehab Admissions Coordinator  [phone] 09/15/2017 1:45 PM

## 2017-09-15 NOTE — Progress Notes (Signed)
Stroke Team Progress Note      SUBJECTIVE  Patient is accompanied  by his wife and daughter in the room. The patient continued to have fluctuating dysarthria and right hemiparesis despite starting back Pradaxa and this afternoon while eating food he choked and coughed a lot and had significant worsening of his deficits following that. I evaluated the patient immediately and ordered a stat CT scan of the head which I personally reviewed which did not show any acute hemorrhage or new stroke.Patient was made nothing by mouth and given IV fluid bolus but unfortunately his deficits persisted. Speech pathology evaluated the patient and is likely stay nothing by mouth for today hence we will plan to start IV heparin.   OBJECTIVE Most recent Vital Signs: Temp: 98.4 F (36.9 C) (05/30 1638) Temp Source: Oral (05/30 1638) BP: 159/82 (05/30 1638) Pulse Rate: 74 (05/30 1638) Respiratory Rate: 20 O2 Saturdation: 94%  CBG (last 3)  Recent Labs    09/14/17 0401  GLUCAP 122*       Studies:  CT brain : no acute abnormality MRI Brain acute left basal ganglia infarct.Punctate acute infarcts in right frontal and posterior occipital lobes as well. Mild changes of chronic small vessel disease. CTAs no significant extracranial carotid or vertebral stenosis. No significant large vessel intracranial stenosis. 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 ECHO 07/20/13 ; normal systolic function with ejection fraction 55-60%. Mitral annulus calcification. LDLpending HbA1cpending  Physical Exam:    Pleasant elderly Caucasian male currently not in distress . Afebrile. Head is nontraumatic. Neck is supple without bruit.    Cardiac exam no murmur or gallop. Lungs are clear to auscultation. Distal pulses are well felt. Neurological Exam ;  Awake  Alert oriented x 3. severely dysarthric speech   .eye movements full without nystagmus.fundi were not visualized. Vision acuity and fields appear normal.  Hearing is normal. Palatal movements are normal. Face asymmetric with moderate right lower face weakness. Tongue midline.  Dense right hemiplegia with only trace withdrawal to pain on the right. Normal strength in the left... Normal sensation. Gait deferred.  ASSESSMENT Mr. Walter Bryant is a 84 y.o. male with  Atrial fibrillation with small bilateral cardioembolic infarct despite anticoagulation with Pradaxa.  Significant worsening of dysarthria and right hemiplegia on 09/15/17 despite being on Pradaxa and repeat CT scan shows no hemorrhage or new strokes Hospital day # 1  TREATMENT/PLAN  I have personally examined this patient, reviewed notes, independently viewed imaging studies, participated in medical decision making and plan of care.ROS completed by me personally and pertinent positives fully documented  I have made any additions or clarifications directly to the above note.  I had a long discussion with the patient, wife and daughter at the bedside regarding his embolic strokes despite anticoagulation on Pradaxa tart IV heparin drip.this patient is unlikely to be able to swallow safely.. Discussed with Dr. Reesa Chew. Greater than 50% time during this 35 minute visit was spent on counseling and coordination of care about his embolic strokes and discussion about options of anticoagulation and alternatives and answering questions   Antony Contras, MD Medical Director [facility] Pager: [phone] 09/15/2017 4:59 PM

## 2017-09-15 NOTE — Consult Note (Signed)
   [facility] CM Inpatient Consult   09/15/2017  Walter Bryant [facility] 07-01-1930 [id number]  Patient screened for potential [facility] in the [employer] plan. Patient had previously been hospitalized with left sided Pneumonia (CAP) 08/15/17 to 08/17/17. Patient  Awoke 09/14/17 with right arm weakness then to right leg with slurred speach and his MRI showed acute left basal ganglia infarction.  Punctate acute infarcts in the right frontal and posterior occipital lobes.  Patient did not receive TPA.     Physical therapy evaluation completed with recommendations of physical medicine rehab consult (PM&R). Will follow for disposition outcomes of PM&R with potential of an inpatient rehab admission.    Please place a [facility] Care Management consult or for questions contact:   Natividad Brood, RN BSN [place] [facility] Liaison  [phone] business mobile phone Toll free office [phone]   Natividad Brood, RN BSN [place] [facility] Liaison  [phone] business mobile phone Toll free office [phone]

## 2017-09-15 NOTE — Consult Note (Signed)
Physical Medicine and Rehabilitation Consult Reason for Consult: Right side weakness and slurred speech Referring Physician: [facility]   HPI: Walter Bryant is a 84 y.o. right-handed male with history of atrial fibrillation maintained on Pradaxa, AAA, hypertension.  Per chart review patient retired Stage manager and lives with spouse at [facility] independent living facility.  Independent with assistive device prior to admission.  Presented 09/14/2017 with right-sided weakness and slurred speech as well as bouts of dizziness.  Cranial CT scan negative.  CT angiogram of head and neck with no dissection or aneurysm.  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  Punctate acute infarcts in the right frontal and posterior occipital lobes.  Echocardiogram pending.  Patient did not receive TPA.  Neurology follow-up currently maintained on Pradaxa as prior to admission with the addition of aspirin therapy.  Regular consistency diet with nectar thick liquids.  Physical therapy evaluation completed with recommendations of physical medicine rehab consult.   Review of Systems  Constitutional: Negative for chills and fever.  HENT: Positive for hearing loss.   Eyes: Negative for double vision.  Respiratory: Negative for cough and shortness of breath.   Cardiovascular: Negative for chest pain, palpitations and leg swelling.  Gastrointestinal: Positive for constipation. Negative for nausea and vomiting.       GERD  Genitourinary: Positive for urgency.  Neurological: Positive for dizziness, sensory change, speech change and focal weakness.       Vertigo  Psychiatric/Behavioral: Positive for depression.       Anxiety  All other systems reviewed and are negative.  Past Medical History:  Diagnosis Date  . A-fib ([place])   . AAA (abdominal aortic aneurysm) ([place])   . Abnormality of gait 12/20/2013  . Anxiety and depression   . Anxiety and depression    . Arthritis   . Barrett's esophagus   . BPH (benign prostatic hyperplasia)   . Degenerative arthritis   . GERD (gastroesophageal reflux disease)   . History of concussion   . HOH (hard of hearing)   . Hyperlipidemia   . Hypertension   . Left tibial fracture   . Macular degeneration Left  . Polyneuropathy in other diseases classified elsewhere ([place]) 12/20/2013  . Right clavicle fracture   . Spinal stenosis   . Transient global amnesia   . Vertigo    Past Surgical History:  Procedure Laterality Date  . ABDOMINAL AORTIC ANEURYSM REPAIR  2016   stent placement  . APPENDECTOMY    . CATARACT EXTRACTION     Bilateral  . KNEE SURGERY Bilateral    both  knees replaced  . LAPAROSCOPIC RIGHT HEMI COLECTOMY    . LUMBAR LAMINECTOMY     L4   . REFRACTIVE SURGERY Left    macular degeneration  . TONSILLECTOMY AND ADENOIDECTOMY    . TOTAL HIP ARTHROPLASTY Right    x 2  . WRIST FRACTURE SURGERY Right    Family History  Problem Relation Age of Onset  . Heart disease Sister        AFIB  . Neuropathy Neg Hx    Social History:  reports that he quit smoking about 51 years ago. He has never used smokeless tobacco. He reports that he does not drink alcohol or use drugs. Allergies:  Allergies  Allergen Reactions  . Other     vicroyl sutures  . Morphine Other (See Comments)    Edgy   . Oxycodone  Other (See Comments)    Edgy    Medications Prior to Admission  Medication Sig Dispense Refill  . ALPRAZolam (XANAX) 0.25 MG tablet Take 1 tablet (0.25 mg total) by mouth daily as needed for anxiety. 30 tablet 0  . Ascorbic Acid (VITAMIN C) 1000 MG tablet Take 1,000 mg by mouth daily.    Marland Kitchen aspirin 81 MG tablet Take 81 mg by mouth at bedtime.     . Calcium-Magnesium-Vitamin D (CALCIUM 500 PO) Take 1 tablet by mouth daily.    . dabigatran (PRADAXA) 150 MG CAPS capsule Take 150 mg by mouth 2 (two) times daily.    . fexofenadine (ALLEGRA) 180 MG tablet Take 180 mg by mouth every morning.    .  fluticasone (FLONASE) 50 MCG/ACT nasal spray Place 2 sprays into both nostrils daily for 7 days. (Patient taking differently: Place 2 sprays into both nostrils daily as needed for allergies. ) 1 g 0  . furosemide (LASIX) 20 MG tablet Take 1 tablet (20 mg total) by mouth daily. 30 tablet 0  . ibuprofen (ADVIL,MOTRIN) 200 MG tablet Take 200-600 mg by mouth every 6 (six) hours as needed for moderate pain.     Marland Kitchen lisinopril (PRINIVIL,ZESTRIL) 5 MG tablet Take 5 mg by mouth daily after lunch.     . LUTEIN PO Take 25 mg by mouth daily after lunch.     . metoprolol succinate (TOPROL-XL) 50 MG 24 hr tablet Take 50 mg by mouth 2 (two) times daily. Take with or immediately following a meal.     . Multiple Vitamin (MULTIVITAMIN) tablet Take 1 tablet by mouth daily.    . Omega-3 Fatty Acids (OMEGA 3 PO) Take 1 tablet by mouth daily after lunch.     Marland Kitchen omeprazole (PRILOSEC) 20 MG capsule Take 20 mg by mouth every morning.     Marland Kitchen POTASSIUM PO Take 1 tablet by mouth daily after lunch.     . pregabalin (LYRICA) 75 MG capsule Take 75 mg by mouth 4 (four) times daily as needed (pain).     . ranitidine (ZANTAC) 150 MG tablet Take 150 mg by mouth 2 (two) times daily. Take in the afternoon and bedtime    . Selenium 200 MCG TABS Take 1 tablet by mouth daily after lunch.     . simvastatin (ZOCOR) 20 MG tablet Take 20 mg by mouth every morning.     . sodium chloride (OCEAN) 0.65 % SOLN nasal spray Place 1 spray into both nostrils every morning.    . tamsulosin (FLOMAX) 0.4 MG CAPS capsule Take 0.4 mg by mouth at bedtime.     Marland Kitchen zinc gluconate 50 MG tablet Take 50 mg by mouth every morning.       Home: Home Living Family/patient expects to be discharged to:: Private residence Living Arrangements: Spouse/significant other Available Help at Discharge: Family, Available 24 hours/day Type of Home: House Home Access: Level entry [place]: One level Bathroom Shower/Tub: [place]: Environmental consultant - 4 wheels, Shower seat, Grab bars - toilet, Grab bars - tub/shower, Bedside commode Additional Comments: From [employer]  Functional History: Prior Function Level of Independence: Needs assistance Gait / Transfers Assistance Needed: Rollator for mobility ADL's / Homemaking Assistance Needed: Aide assisting with bathing due to hx of falls in shower and balance deficits Functional Status:  Mobility: Bed Mobility Overal bed mobility: Needs Assistance Bed Mobility: Supine to Sit, Sit to Supine Supine to sit: Min assist Sit to supine:  Mod assist General bed mobility comments: Pt declining bed mobility at this time Transfers Overall transfer level: Needs assistance Equipment used: Rolling walker (2 wheeled) Transfers: Sit to/from Stand Sit to Stand: Mod assist General transfer comment: Pt declining transfers at this time Ambulation/Gait General Gait Details: Not tested    ADL: ADL Overall ADL's : Needs assistance/impaired Eating/Feeding: Set up, Bed level Grooming: Set up, Bed level General ADL Comments: Pt declining bed mobility or transfers at this time. Reports he feels he is slowly returning to baseline but still feels RLE is weak.   Cognition: Cognition Overall Cognitive Status: Within Functional Limits for tasks assessed Orientation Level: Oriented X4 Cognition Arousal/Alertness: Awake/alert Behavior During Therapy: WFL for tasks assessed/performed Overall Cognitive Status: Within Functional Limits for tasks assessed  Blood pressure (!) 150/87, pulse 72, temperature 98.2 F (36.8 C), temperature source Oral, resp. rate 18, height 6\' 2"  (1.88 m), weight 98 kg (216 lb 0.8 oz), SpO2 96 %. Physical Exam  HENT:  Head: Normocephalic.  Eyes: Pupils are equal, round, and reactive to light.  Neck: Normal range of motion.  Cardiovascular: Normal rate.  Respiratory: Effort normal.  GI: Soft.  Musculoskeletal: He exhibits no edema.  Neurological:  Alert.   Speech is a bit dysarthric but intelligible.  Follows full commands. Right central 7. RUE 4- to 4/5 with decreased FTN, RLE: 3/5 prox to 4/5 distally. Stocking glove sensory changes in both legs to knees.  Psychiatric:  Pleasant but anxious    No results found for this or any previous visit (from the past 24 hour(s)). Ct Angio Head W Or Wo Contrast  Result Date: 09/14/2017 CLINICAL DATA:  84 y/o M; right-sided weakness, facial droop, slurred speech. EXAM: CT ANGIOGRAPHY HEAD AND NECK CT PERFUSION BRAIN TECHNIQUE: Multidetector CT imaging of the head and neck was performed using the standard protocol during bolus administration of intravenous contrast. Multiplanar CT image reconstructions and MIPs were obtained to evaluate the vascular anatomy. Carotid stenosis measurements (when applicable) are obtained utilizing NASCET criteria, using the distal internal carotid diameter as the denominator. Multiphase CT imaging of the brain was performed following IV bolus contrast injection. Subsequent parametric perfusion maps were calculated using RAPID software. CONTRAST:  160mL ISOVUE-370 IOPAMIDOL (ISOVUE-370) INJECTION 76% COMPARISON:  09/08/2017 CT head. FINDINGS: CTA NECK FINDINGS Aortic arch: Standard branching. Imaged portion shows no evidence of aneurysm or dissection. No significant stenosis of the major arch vessel origins. Moderate calcific atherosclerosis of aortic arch. Right carotid system: No evidence of dissection, stenosis (50% or greater) or occlusion. Non stenotic calcific atherosclerosis of the carotid bifurcation. Left carotid system: No evidence of dissection, stenosis (50% or greater) or occlusion. Non stenotic calcific atherosclerosis of the carotid bifurcation. Vertebral arteries: Left dominant. No evidence of dissection, stenosis (50% or greater) or occlusion. Skeleton: Moderate cervical spondylosis with multilevel disc and facet degenerative changes. No high-grade bony canal stenosis. Other  neck: Left submandibular gland atrophy. Upper chest: Negative. 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 Calcific atherosclerosis of the carotid siphons with mild bilateral paraclinoid stenosis. Posterior circulation: No significant stenosis, proximal occlusion, aneurysm, or vascular malformation. Calcific atherosclerosis of vertebral arteries of mild right V4 stenosis. Venous sinuses: As permitted by contrast timing, patent. Anatomic variants: Patent anterior communicating artery. No posterior communicating artery identified, likely hypoplastic or absent. Review of the MIP images confirms the above  findings CT Brain Perfusion Findings: CBF (<30%) Volume: 59mL Perfusion (Tmax>6.0s) volume: 65mL Mismatch Volume: NA. Infarction Location:The AIF and VOF curves had a slow down slope and did not reach equilibrium within scan time. Quantification may be mildly inaccurate, however, there is no asymmetric perfusion abnormality to indicate infarction. IMPRESSION: 1. Patent carotid and vertebral arteries. No dissection, aneurysm, or hemodynamically significant stenosis utilizing NASCET criteria. 2. Patent anterior and posterior intracranial circulation. No large vessel occlusion or high-grade stenosis. 3. 5 mm anterior communicating artery aneurysm. 4. No evidence for focal acute stroke on perfusion imaging. These results were called by telephone at the time of interpretation on 09/14/2017 at 5:00 am to Dr. Amie Portland , who verbally acknowledged these results. Electronically Signed   By: Kristine Garbe M.D.   On: 09/14/2017 05:10   Dg Chest 2 View  Result Date: 09/14/2017 CLINICAL DATA:  Status post CVA. Recent pulmonary infiltrates. EXAM: CHEST - 2 VIEW COMPARISON:  Chest radiograph performed 08/15/2017 FINDINGS: Worsening left basilar airspace opacity is  concerning for recurrent pneumonia. Mild vascular congestion is noted. Mild peribronchial thickening is seen. No significant pleural effusion or pneumothorax is identified. The cardiomediastinal silhouette is borderline normal in size. No acute osseous abnormalities are identified. Mild degenerative change is noted at the left glenohumeral joint. IMPRESSION: Worsening left basilar airspace opacity is concerning for recurrent pneumonia. Mild vascular congestion noted. Electronically Signed   By: Garald Balding M.D.   On: 09/14/2017 06:31   Ct Angio Neck W Or Wo Contrast  Result Date: 09/14/2017 CLINICAL DATA:  84 y/o M; right-sided weakness, facial droop, slurred speech. EXAM: CT ANGIOGRAPHY HEAD AND NECK CT PERFUSION BRAIN TECHNIQUE: Multidetector CT imaging of the head and neck was performed using the standard protocol during bolus administration of intravenous contrast. Multiplanar CT image reconstructions and MIPs were obtained to evaluate the vascular anatomy. Carotid stenosis measurements (when applicable) are obtained utilizing NASCET criteria, using the distal internal carotid diameter as the denominator. Multiphase CT imaging of the brain was performed following IV bolus contrast injection. Subsequent parametric perfusion maps were calculated using RAPID software. CONTRAST:  164mL ISOVUE-370 IOPAMIDOL (ISOVUE-370) INJECTION 76% COMPARISON:  09/08/2017 CT head. FINDINGS: CTA NECK FINDINGS Aortic arch: Standard branching. Imaged portion shows no evidence of aneurysm or dissection. No significant stenosis of the major arch vessel origins. Moderate calcific atherosclerosis of aortic arch. Right carotid system: No evidence of dissection, stenosis (50% or greater) or occlusion. Non stenotic calcific atherosclerosis of the carotid bifurcation. Left carotid system: No evidence of dissection, stenosis (50% or greater) or occlusion. Non stenotic calcific atherosclerosis of the carotid bifurcation. Vertebral  arteries: Left dominant. No evidence of dissection, stenosis (50% or greater) or occlusion. Skeleton: Moderate cervical spondylosis with multilevel disc and facet degenerative changes. No high-grade bony canal stenosis. Other neck: Left submandibular gland atrophy. Upper chest: Negative. 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 Calcific atherosclerosis of the carotid siphons with mild bilateral paraclinoid stenosis. Posterior circulation: No significant stenosis, proximal occlusion, aneurysm, or vascular malformation. Calcific atherosclerosis of vertebral arteries of mild right V4 stenosis. Venous sinuses: As permitted by contrast timing, patent. Anatomic variants: Patent anterior communicating artery. No posterior communicating artery identified, likely hypoplastic or absent. Review of the MIP images confirms the above findings CT Brain Perfusion Findings: CBF (<30%) Volume: 48mL Perfusion (Tmax>6.0s) volume: 75mL Mismatch Volume: NA. Infarction  Location:The AIF and VOF curves had a slow down slope and did not reach equilibrium within scan time. Quantification may be mildly inaccurate, however, there is no asymmetric perfusion abnormality to indicate infarction. IMPRESSION: 1. Patent carotid and vertebral arteries. No dissection, aneurysm, or hemodynamically significant stenosis utilizing NASCET criteria. 2. Patent anterior and posterior intracranial circulation. No large vessel occlusion or high-grade stenosis. 3. 5 mm anterior communicating artery aneurysm. 4. No evidence for focal acute stroke on perfusion imaging. These results were called by telephone at the time of interpretation on 09/14/2017 at 5:00 am to Dr. Amie Portland , who verbally acknowledged these results. Electronically Signed   By: Kristine Garbe M.D.   On: 09/14/2017 05:10   Mr Brain Wo  Contrast  Result Date: 09/14/2017 CLINICAL DATA:  Right-sided weakness and slurred speech. EXAM: MRI HEAD WITHOUT CONTRAST TECHNIQUE: Multiplanar, multiecho pulse sequences of the brain and surrounding structures were obtained without intravenous contrast. COMPARISON:  Head CT, CTA, and perfusion 09/14/2017 FINDINGS: Brain: There is an acute lateral lenticulostriate territory infarct involving the posterior left basal ganglia, corona radiata, and external capsule. Punctate acute infarcts are also present in both occipital lobes and posterior right frontal cortex. No intracranial hemorrhage, mass, midline shift, or extra-axial fluid collection is identified. There is moderate generalized cerebral atrophy. Periventricular white matter T2 hyperintensities are nonspecific but compatible with mild-to-moderate chronic small vessel ischemic disease. There is a chronic infarct in the right pons. Vascular: Major intracranial vascular flow voids are preserved. Skull and upper cervical spine: Unremarkable bone marrow signal. Sinuses/Orbits: Bilateral cataract extraction. Trace left maxillary sinus mucosal thickening. Trace bilateral mastoid fluid. Other: None. IMPRESSION: 1. Acute left basal ganglia infarct. 2. Punctate acute infarcts in the right frontal and posterior occipital lobes. 3. Mild-to-moderate chronic small vessel ischemic disease and moderate cerebral atrophy. Electronically Signed   By: Logan Bores M.D.   On: 09/14/2017 08:08   Ct Cerebral Perfusion W Contrast  Result Date: 09/14/2017 CLINICAL DATA:  84 y/o M; right-sided weakness, facial droop, slurred speech. EXAM: CT ANGIOGRAPHY HEAD AND NECK CT PERFUSION BRAIN TECHNIQUE: Multidetector CT imaging of the head and neck was performed using the standard protocol during bolus administration of intravenous contrast. Multiplanar CT image reconstructions and MIPs were obtained to evaluate the vascular anatomy. Carotid stenosis measurements (when applicable) are  obtained utilizing NASCET criteria, using the distal internal carotid diameter as the denominator. Multiphase CT imaging of the brain was performed following IV bolus contrast injection. Subsequent parametric perfusion maps were calculated using RAPID software. CONTRAST:  147mL ISOVUE-370 IOPAMIDOL (ISOVUE-370) INJECTION 76% COMPARISON:  09/08/2017 CT head. FINDINGS: CTA NECK FINDINGS Aortic arch: Standard branching. Imaged portion shows no evidence of aneurysm or dissection. No significant stenosis of the major arch vessel origins. Moderate calcific atherosclerosis of aortic arch. Right carotid system: No evidence of dissection, stenosis (50% or greater) or occlusion. Non stenotic calcific atherosclerosis of the carotid bifurcation. Left carotid system: No evidence of dissection, stenosis (50% or greater) or occlusion. Non stenotic calcific atherosclerosis of the carotid bifurcation. Vertebral arteries: Left dominant. No evidence of dissection, stenosis (50% or greater) or occlusion. Skeleton: Moderate cervical spondylosis with multilevel disc and facet degenerative changes. No high-grade bony canal stenosis. Other neck: Left submandibular gland atrophy. Upper chest: Negative. 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 Calcific atherosclerosis of the carotid siphons with mild  bilateral paraclinoid stenosis. Posterior circulation: No significant stenosis, proximal occlusion, aneurysm, or vascular malformation. Calcific atherosclerosis of vertebral arteries of mild right V4 stenosis. Venous sinuses: As permitted by contrast timing, patent. Anatomic variants: Patent anterior communicating artery. No posterior communicating artery identified, likely hypoplastic or absent. Review of the MIP images confirms the above findings CT Brain Perfusion Findings: CBF (<30%) Volume:  18mL Perfusion (Tmax>6.0s) volume: 33mL Mismatch Volume: NA. Infarction Location:The AIF and VOF curves had a slow down slope and did not reach equilibrium within scan time. Quantification may be mildly inaccurate, however, there is no asymmetric perfusion abnormality to indicate infarction. IMPRESSION: 1. Patent carotid and vertebral arteries. No dissection, aneurysm, or hemodynamically significant stenosis utilizing NASCET criteria. 2. Patent anterior and posterior intracranial circulation. No large vessel occlusion or high-grade stenosis. 3. 5 mm anterior communicating artery aneurysm. 4. No evidence for focal acute stroke on perfusion imaging. These results were called by telephone at the time of interpretation on 09/14/2017 at 5:00 am to Dr. Amie Portland , who verbally acknowledged these results. Electronically Signed   By: Kristine Garbe M.D.   On: 09/14/2017 05:10   Ct Head Code Stroke Wo Contrast  Result Date: 09/14/2017 CLINICAL DATA:  Code stroke. 84 y/o M; right-sided weakness, facial droop, slurred speech. EXAM: CT HEAD WITHOUT CONTRAST TECHNIQUE: Contiguous axial images were obtained from the base of the skull through the vertex without intravenous contrast. COMPARISON:  None. FINDINGS: Brain: No evidence of acute infarction, hemorrhage, hydrocephalus, extra-axial collection or mass lesion/mass effect. Chronic microvascular ischemic changes and parenchymal volume loss of the brain. Vascular: Calcific atherosclerosis of vertebral arteries and carotid siphons. No hyperdense vessel identified. Skull: Normal. Negative for fracture or focal lesion. Sinuses/Orbits: No acute finding. Other: None. ASPECTS [facility] Stroke Program Early CT Score) - Ganglionic level infarction (caudate, lentiform nuclei, internal capsule, insula, M1-M3 cortex): 7 - Supraganglionic infarction (M4-M6 cortex): 3 Total score (0-10 with 10 being normal): 10 IMPRESSION: 1. No acute intracranial abnormality identified. 2.  ASPECTS is 10 3. Chronic microvascular ischemic changes and parenchymal volume loss of the brain These results were communicated to Dr. Rory Percy at [place] 5/29/2019by text page via the [facility] messaging system. Electronically Signed   By: Kristine Garbe M.D.   On: 09/14/2017 04:18    Assessment/Plan: Diagnosis: left basal ganglia infarct with right hemiparesis 1. Does the need for close, 24 hr/day medical supervision in concert with the patient's rehab needs make it unreasonable for this patient to be served in a less intensive setting? Yes 2. Co-Morbidities requiring supervision/potential complications: afib, htn, anxiety 3. Due to bladder management, bowel management, safety, skin/wound care, disease management, medication administration and patient education, does the patient require 24 hr/day rehab nursing? Yes 4. Does the patient require coordinated care of a physician, rehab nurse, PT (1-2 hrs/day, 5 days/week), OT (1-2 hrs/day, 5 days/week) and SLP (1-2 hrs/day, 5 days/week) to address physical and functional deficits in the context of the above medical diagnosis(es)? Yes Addressing deficits in the following areas: balance, endurance, locomotion, strength, transferring, bowel/bladder control, bathing, dressing, feeding, grooming, toileting, speech, swallowing and psychosocial support 5. Can the patient actively participate in an intensive therapy program of at least 3 hrs of therapy per day at least 5 days per week? Yes 6. The potential for patient to make measurable gains while on inpatient rehab is excellent 7. Anticipated functional outcomes upon discharge from inpatient rehab are modified independent  with PT, modified independent with OT, modified independent with SLP. 8. Estimated  rehab length of stay to reach the above functional goals is: 8-14 days 9. Anticipated D/C setting: Home 10. Anticipated post D/C treatments: HH therapy and Outpatient therapy 11. Overall  Rehab/Functional Prognosis: excellent  RECOMMENDATIONS: This patient's condition is appropriate for continued rehabilitative care in the following setting: CIR Patient has agreed to participate in recommended program. Yes Note that insurance prior authorization may be required for reimbursement for recommended care.  Comment: Rehab Admissions Coordinator to follow up.  Thanks,  Meredith Staggers, MD, Mellody Drown  I have personally performed a face to face diagnostic evaluation of this patient. Additionally, I have reviewed and concur with the physician assistant's documentation above.      Lavon Paganini Angiulli, PA-C 09/15/2017

## 2017-09-15 NOTE — Progress Notes (Signed)
  Speech Language Pathology Treatment: Dysphagia  Patient Details Name: Walter Bryant MRN: [id number] DOB: 10/17/1930 Today's Date: 09/15/2017 Time: 7672-0947 SLP Time Calculation (min) (ACUTE ONLY): 12 min  Assessment / Plan / Recommendation Clinical Impression  Received phone call from Dr. Leonie Man - pt with change in function, coughing with POs.  Returned to room - wife present.  Pt with worsening dysarthria since last seen during MBS at 11:30 (at which time speech was clear); increased asymmetry right lower face, tongue deviates mildly to right.  Results of MBS were normal, with no aspiration, and pt's diet was changed to regular/thin liquids.  Thin liquids now are evoking consistent coughing, concerning for aspiration.   Recommend NPO; CCT is pending.  D/W wife/pt that SLP services will return in am to reassess swallow function. They verbalize agreement.  D/W RN.    HPI HPI: Pt is a 84 yo male, retired [employer], now living at [facility], with hx of atrial fibrillation on Pradaxa, AAA, anxiety and depression, hypertension, hyperlipidemia, TGA's in the past, severe peripheral neuropathy. Pt admitted for stroke work up with MRI revealing "1. Acute left basal ganglia infarct. 2. Punctate acute infarcts in the right frontal and posterior. 3. Mild-to-moderate chronic small vessel ischemic disease and moderate cerebral atrophy." Daughter reports recent pneumonia. ED visit noted 08/13/17. Most recent CXR 5/29 shows "Worsening left basilar airspace opacity is concerning for recurrent pneumonia"      SLP Plan  Continue with current plan of care       Recommendations  Diet recommendations: NPO                Oral Care Recommendations: Oral care BID Follow up Recommendations: None SLP Visit Diagnosis: Dysphagia, oropharyngeal phase (R13.12) Plan: Continue with current plan of care       GO                [name] 09/15/2017, 1:54 PM

## 2017-09-15 NOTE — Progress Notes (Signed)
PT Cancellation Note  Patient Details Name: Walter Bryant MRN: [id number] DOB: [birth year]   Cancelled Treatment:    Reason Eval/Treat Not Completed: Patient at procedure or test/unavailable second attempt to see pt today. Patient off unit for CT scan. PT will continue to follow acutely.    Salina April, PTA Pager: [phone]   09/15/2017, 2:48 PM

## 2017-09-15 NOTE — Discharge Summary (Signed)
Physician Discharge Summary  Walter Bryant [facility] ZCH:[id number] DOB: 13-Nov-1930 DOA: 09/14/2017  PCP: Haywood Pao, MD  Admit date: 09/14/2017 Discharge date: 09/15/2017  Admitted From: Home Disposition:  CIR  Recommendations for Outpatient Follow-up:  1. Follow up with PCP in 1-2 weeks once discharged from CIR 2. Please obtain BMP/CBC in one week your next doctors visit.  3. Continue taking Asa and Pradaxa.  4. Increased Statin to Atorvastatin 80mg  po daily.    Discharge Condition: Stable CODE STATUS: Full  Diet recommendation: Cardiac/Heart healthy  Brief/Interim Summary: 84 year old male with a history of atrial fibrillation on Pradaxa, AAA, essential hypertension, TGA came to the hospital with complaints of right arm weakness and numbness.  Stroke team was consulted and patient underwent routine stroke work-up.  MRI of the brain revealed acute left basilar, right frontal, right posterior and occipital lobe infarct.  At this point patient is already on maximum therapy, therefore neurology recommended continuing Pradaxa.  Statin was changed to high intensity.  He was evaluated by speech and was thought to be an mild aspiration risk therefore recommending regular, nectar thick liquid.  Whole meds with pure.  Patient to be fed with supervision.  Physical therapy recommended patient would benefit from inpatient treatment therefore CIR was consulted. During the hospital stay patient's symptoms were very fluctuating in terms of his right upper extremity weakness and slurred speech.  Due to this it was causing a lot of anxiety for the patient therefore he was prescribed low-dose Xanax.  At this point patient has reached maximum benefit from an hospital stay treatment and he would further need inpatient rehabilitation.  Eventually would also benefit from outpatient follow-up with cardiology in terms of long-term treatment for atrial fibrillation and considering other options of  treatment.   Discharge Diagnoses:  Principal Problem:   Acute ischemic stroke [facility]) Active Problems:   HTN (hypertension)   A-fib (HCC)  Right upper extremity weakness and dysarthria Acute CVA-acute left basilar, right frontal, right posterior and occipital lobe infarct - Aspirin has been ordered, will resume Pradaxa.  Appreciate neurology input.  Will increase simvastatin to atorvastatin 80 mg daily -PT/OT recommended inpatient rehab, speech therapy recommended regular/pure thick liquid -Hemoglobin A1c was noted to be 5.2, lipid panel showed LDL of 67. - Echocardiogram performed  Persistent atrial fibrillation -Pradaxa has been resumed.  Resume home medications.  Would recommend following up with outpatient cardiologist and preferably electrophysiology for further treatment.  Essential hypertension -Currently on hold, will resume at the time of discharge  Discharge Instructions   Allergies as of 09/15/2017      Reactions   Other    vicroyl sutures   Morphine Other (See Comments)   Edgy    Oxycodone Other (See Comments)   Edgy       Medication List    STOP taking these medications   simvastatin 20 MG tablet Commonly known as:  ZOCOR     TAKE these medications   ALPRAZolam 0.25 MG tablet Commonly known as:  XANAX Take 1 tablet (0.25 mg total) by mouth daily as needed for anxiety.   aspirin 81 MG tablet Take 81 mg by mouth at bedtime.   atorvastatin 80 MG tablet Commonly known as:  LIPITOR Take 1 tablet (80 mg total) by mouth daily.   CALCIUM 500 PO Take 1 tablet by mouth daily.   fexofenadine 180 MG tablet Commonly known as:  ALLEGRA Take 180 mg by mouth every morning.   fluticasone 50 MCG/ACT nasal spray Commonly  known as:  FLONASE Place 2 sprays into both nostrils daily for 7 days. What changed:    when to take this  reasons to take this   furosemide 20 MG tablet Commonly known as:  LASIX Take 1 tablet (20 mg total) by mouth daily.    ibuprofen 200 MG tablet Commonly known as:  ADVIL,MOTRIN Take 200-600 mg by mouth every 6 (six) hours as needed for moderate pain.   lisinopril 5 MG tablet Commonly known as:  PRINIVIL,ZESTRIL Take 5 mg by mouth daily after lunch.   LUTEIN PO Take 25 mg by mouth daily after lunch.   metoprolol succinate 50 MG 24 hr tablet Commonly known as:  TOPROL-XL Take 50 mg by mouth 2 (two) times daily. Take with or immediately following a meal.   multivitamin tablet Take 1 tablet by mouth daily.   OMEGA 3 PO Take 1 tablet by mouth daily after lunch.   omeprazole 20 MG capsule Commonly known as:  PRILOSEC Take 20 mg by mouth every morning.   POTASSIUM PO Take 1 tablet by mouth daily after lunch.   PRADAXA 150 MG Caps capsule Generic drug:  dabigatran Take 150 mg by mouth 2 (two) times daily.   pregabalin 75 MG capsule Commonly known as:  LYRICA Take 75 mg by mouth 4 (four) times daily as needed (pain).   ranitidine 150 MG tablet Commonly known as:  ZANTAC Take 150 mg by mouth 2 (two) times daily. Take in the afternoon and bedtime   Selenium 200 MCG Tabs Take 1 tablet by mouth daily after lunch.   sodium chloride 0.65 % Soln nasal spray Commonly known as:  OCEAN Place 1 spray into both nostrils every morning.   tamsulosin 0.4 MG Caps capsule Commonly known as:  FLOMAX Take 0.4 mg by mouth at bedtime.   vitamin C 1000 MG tablet Take 1,000 mg by mouth daily.   zinc gluconate 50 MG tablet Take 50 mg by mouth every morning.      Follow-up Information    Tisovec, Fransico Him, MD. Schedule an appointment as soon as possible for a visit in 2 week(s).   Specialty:  Internal Medicine Contact information: [place] [address] [phone]          Allergies  Allergen Reactions  . Other     vicroyl sutures  . Morphine Other (See Comments)    Edgy   . Oxycodone Other (See Comments)    Edgy     You were cared for by a hospitalist during your  hospital stay. If you have any questions about your discharge medications or the care you received while you were in the hospital after you are discharged, you can call the unit and asked to speak with the hospitalist on call if the hospitalist that took care of you is not available. Once you are discharged, your primary care physician will handle any further medical issues. Please note that no refills for any discharge medications will be authorized once you are discharged, as it is imperative that you return to your primary care physician (or establish a relationship with a primary care physician if you do not have one) for your aftercare needs so that they can reassess your need for medications and monitor your lab values.  Consultations:  Neurology   Procedures/Studies: Ct Angio Head W Or Wo Contrast  Result Date: 09/14/2017 CLINICAL DATA:  84 y/o M; right-sided weakness, facial droop, slurred speech. EXAM: CT ANGIOGRAPHY HEAD AND NECK  CT PERFUSION BRAIN TECHNIQUE: Multidetector CT imaging of the head and neck was performed using the standard protocol during bolus administration of intravenous contrast. Multiplanar CT image reconstructions and MIPs were obtained to evaluate the vascular anatomy. Carotid stenosis measurements (when applicable) are obtained utilizing NASCET criteria, using the distal internal carotid diameter as the denominator. Multiphase CT imaging of the brain was performed following IV bolus contrast injection. Subsequent parametric perfusion maps were calculated using RAPID software. CONTRAST:  126mL ISOVUE-370 IOPAMIDOL (ISOVUE-370) INJECTION 76% COMPARISON:  09/08/2017 CT head. FINDINGS: CTA NECK FINDINGS Aortic arch: Standard branching. Imaged portion shows no evidence of aneurysm or dissection. No significant stenosis of the major arch vessel origins. Moderate calcific atherosclerosis of aortic arch. Right carotid system: No evidence of dissection, stenosis (50% or greater) or  occlusion. Non stenotic calcific atherosclerosis of the carotid bifurcation. Left carotid system: No evidence of dissection, stenosis (50% or greater) or occlusion. Non stenotic calcific atherosclerosis of the carotid bifurcation. Vertebral arteries: Left dominant. No evidence of dissection, stenosis (50% or greater) or occlusion. Skeleton: Moderate cervical spondylosis with multilevel disc and facet degenerative changes. No high-grade bony canal stenosis. Other neck: Left submandibular gland atrophy. Upper chest: Negative. 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 Calcific atherosclerosis of the carotid siphons with mild bilateral paraclinoid stenosis. Posterior circulation: No significant stenosis, proximal occlusion, aneurysm, or vascular malformation. Calcific atherosclerosis of vertebral arteries of mild right V4 stenosis. Venous sinuses: As permitted by contrast timing, patent. Anatomic variants: Patent anterior communicating artery. No posterior communicating artery identified, likely hypoplastic or absent. Review of the MIP images confirms the above findings CT Brain Perfusion Findings: CBF (<30%) Volume: 12mL Perfusion (Tmax>6.0s) volume: 55mL Mismatch Volume: NA. Infarction Location:The AIF and VOF curves had a slow down slope and did not reach equilibrium within scan time. Quantification may be mildly inaccurate, however, there is no asymmetric perfusion abnormality to indicate infarction. IMPRESSION: 1. Patent carotid and vertebral arteries. No dissection, aneurysm, or hemodynamically significant stenosis utilizing NASCET criteria. 2. Patent anterior and posterior intracranial circulation. No large vessel occlusion or high-grade stenosis. 3. 5 mm anterior communicating artery aneurysm. 4. No evidence for focal acute stroke on perfusion imaging. These  results were called by telephone at the time of interpretation on 09/14/2017 at 5:00 am to Dr. Amie Portland , who verbally acknowledged these results. Electronically Signed   By: Kristine Garbe M.D.   On: 09/14/2017 05:10   Dg Chest 2 View  Result Date: 09/14/2017 CLINICAL DATA:  Status post CVA. Recent pulmonary infiltrates. EXAM: CHEST - 2 VIEW COMPARISON:  Chest radiograph performed 08/15/2017 FINDINGS: Worsening left basilar airspace opacity is concerning for recurrent pneumonia. Mild vascular congestion is noted. Mild peribronchial thickening is seen. No significant pleural effusion or pneumothorax is identified. The cardiomediastinal silhouette is borderline normal in size. No acute osseous abnormalities are identified. Mild degenerative change is noted at the left glenohumeral joint. IMPRESSION: Worsening left basilar airspace opacity is concerning for recurrent pneumonia. Mild vascular congestion noted. Electronically Signed   By: Garald Balding M.D.   On: 09/14/2017 06:31   Ct Angio Neck W Or Wo Contrast  Result Date: 09/14/2017 CLINICAL DATA:  84 y/o M; right-sided weakness, facial droop, slurred speech. EXAM: CT ANGIOGRAPHY HEAD AND NECK CT PERFUSION BRAIN TECHNIQUE: Multidetector CT imaging of the head and neck was performed using the standard  protocol during bolus administration of intravenous contrast. Multiplanar CT image reconstructions and MIPs were obtained to evaluate the vascular anatomy. Carotid stenosis measurements (when applicable) are obtained utilizing NASCET criteria, using the distal internal carotid diameter as the denominator. Multiphase CT imaging of the brain was performed following IV bolus contrast injection. Subsequent parametric perfusion maps were calculated using RAPID software. CONTRAST:  161mL ISOVUE-370 IOPAMIDOL (ISOVUE-370) INJECTION 76% COMPARISON:  09/08/2017 CT head. FINDINGS: CTA NECK FINDINGS Aortic arch: Standard branching. Imaged portion shows no  evidence of aneurysm or dissection. No significant stenosis of the major arch vessel origins. Moderate calcific atherosclerosis of aortic arch. Right carotid system: No evidence of dissection, stenosis (50% or greater) or occlusion. Non stenotic calcific atherosclerosis of the carotid bifurcation. Left carotid system: No evidence of dissection, stenosis (50% or greater) or occlusion. Non stenotic calcific atherosclerosis of the carotid bifurcation. Vertebral arteries: Left dominant. No evidence of dissection, stenosis (50% or greater) or occlusion. Skeleton: Moderate cervical spondylosis with multilevel disc and facet degenerative changes. No high-grade bony canal stenosis. Other neck: Left submandibular gland atrophy. Upper chest: Negative. 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 Calcific atherosclerosis of the carotid siphons with mild bilateral paraclinoid stenosis. Posterior circulation: No significant stenosis, proximal occlusion, aneurysm, or vascular malformation. Calcific atherosclerosis of vertebral arteries of mild right V4 stenosis. Venous sinuses: As permitted by contrast timing, patent. Anatomic variants: Patent anterior communicating artery. No posterior communicating artery identified, likely hypoplastic or absent. Review of the MIP images confirms the above findings CT Brain Perfusion Findings: CBF (<30%) Volume: 4mL Perfusion (Tmax>6.0s) volume: 41mL Mismatch Volume: NA. Infarction Location:The AIF and VOF curves had a slow down slope and did not reach equilibrium within scan time. Quantification may be mildly inaccurate, however, there is no asymmetric perfusion abnormality to indicate infarction. IMPRESSION: 1. Patent carotid and vertebral arteries. No dissection, aneurysm, or hemodynamically significant stenosis utilizing NASCET criteria. 2.  Patent anterior and posterior intracranial circulation. No large vessel occlusion or high-grade stenosis. 3. 5 mm anterior communicating artery aneurysm. 4. No evidence for focal acute stroke on perfusion imaging. These results were called by telephone at the time of interpretation on 09/14/2017 at 5:00 am to Dr. Amie Portland , who verbally acknowledged these results. Electronically Signed   By: Kristine Garbe M.D.   On: 09/14/2017 05:10   Mr Brain Wo Contrast  Result Date: 09/14/2017 CLINICAL DATA:  Right-sided weakness and slurred speech. EXAM: MRI HEAD WITHOUT CONTRAST TECHNIQUE: Multiplanar, multiecho pulse sequences of the brain and surrounding structures were obtained without intravenous contrast. COMPARISON:  Head CT, CTA, and perfusion 09/14/2017 FINDINGS: Brain: There is an acute lateral lenticulostriate territory infarct involving the posterior left basal ganglia, corona radiata, and external capsule. Punctate acute infarcts are also present in both occipital lobes and posterior right frontal cortex. No intracranial hemorrhage, mass, midline shift, or extra-axial fluid collection is identified. There is moderate generalized cerebral atrophy. Periventricular white matter T2 hyperintensities are nonspecific but compatible with mild-to-moderate chronic small vessel ischemic disease. There is a chronic infarct in the right pons. Vascular: Major intracranial vascular flow voids are preserved. Skull and upper cervical spine: Unremarkable bone marrow signal. Sinuses/Orbits: Bilateral cataract extraction. Trace left maxillary sinus mucosal thickening. Trace bilateral mastoid fluid. Other: None. IMPRESSION: 1. Acute left basal ganglia infarct. 2. Punctate acute infarcts in the right frontal and posterior occipital  lobes. 3. Mild-to-moderate chronic small vessel ischemic disease and moderate cerebral atrophy. Electronically Signed   By: Logan Bores M.D.   On: 09/14/2017 08:08   Ct Cerebral Perfusion  W Contrast  Result Date: 09/14/2017 CLINICAL DATA:  84 y/o M; right-sided weakness, facial droop, slurred speech. EXAM: CT ANGIOGRAPHY HEAD AND NECK CT PERFUSION BRAIN TECHNIQUE: Multidetector CT imaging of the head and neck was performed using the standard protocol during bolus administration of intravenous contrast. Multiplanar CT image reconstructions and MIPs were obtained to evaluate the vascular anatomy. Carotid stenosis measurements (when applicable) are obtained utilizing NASCET criteria, using the distal internal carotid diameter as the denominator. Multiphase CT imaging of the brain was performed following IV bolus contrast injection. Subsequent parametric perfusion maps were calculated using RAPID software. CONTRAST:  176mL ISOVUE-370 IOPAMIDOL (ISOVUE-370) INJECTION 76% COMPARISON:  09/08/2017 CT head. FINDINGS: CTA NECK FINDINGS Aortic arch: Standard branching. Imaged portion shows no evidence of aneurysm or dissection. No significant stenosis of the major arch vessel origins. Moderate calcific atherosclerosis of aortic arch. Right carotid system: No evidence of dissection, stenosis (50% or greater) or occlusion. Non stenotic calcific atherosclerosis of the carotid bifurcation. Left carotid system: No evidence of dissection, stenosis (50% or greater) or occlusion. Non stenotic calcific atherosclerosis of the carotid bifurcation. Vertebral arteries: Left dominant. No evidence of dissection, stenosis (50% or greater) or occlusion. Skeleton: Moderate cervical spondylosis with multilevel disc and facet degenerative changes. No high-grade bony canal stenosis. Other neck: Left submandibular gland atrophy. Upper chest: Negative. 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 Calcific atherosclerosis of the carotid siphons with mild bilateral paraclinoid stenosis.  Posterior circulation: No significant stenosis, proximal occlusion, aneurysm, or vascular malformation. Calcific atherosclerosis of vertebral arteries of mild right V4 stenosis. Venous sinuses: As permitted by contrast timing, patent. Anatomic variants: Patent anterior communicating artery. No posterior communicating artery identified, likely hypoplastic or absent. Review of the MIP images confirms the above findings CT Brain Perfusion Findings: CBF (<30%) Volume: 56mL Perfusion (Tmax>6.0s) volume: 32mL Mismatch Volume: NA. Infarction Location:The AIF and VOF curves had a slow down slope and did not reach equilibrium within scan time. Quantification may be mildly inaccurate, however, there is no asymmetric perfusion abnormality to indicate infarction. IMPRESSION: 1. Patent carotid and vertebral arteries. No dissection, aneurysm, or hemodynamically significant stenosis utilizing NASCET criteria. 2. Patent anterior and posterior intracranial circulation. No large vessel occlusion or high-grade stenosis. 3. 5 mm anterior communicating artery aneurysm. 4. No evidence for focal acute stroke on perfusion imaging. These results were called by telephone at the time of interpretation on 09/14/2017 at 5:00 am to Dr. Amie Portland , who verbally acknowledged these results. Electronically Signed   By: Kristine Garbe M.D.   On: 09/14/2017 05:10   Ct Head Code Stroke Wo Contrast  Result Date: 09/14/2017 CLINICAL DATA:  Code stroke. 84 y/o M; right-sided weakness, facial droop, slurred speech. EXAM: CT HEAD WITHOUT CONTRAST TECHNIQUE: Contiguous axial images were obtained from the base of the skull through the vertex without intravenous contrast. COMPARISON:  None. FINDINGS: Brain: No evidence of acute infarction, hemorrhage, hydrocephalus, extra-axial collection or mass lesion/mass effect. Chronic microvascular ischemic changes and parenchymal volume loss of the brain. Vascular: Calcific atherosclerosis of vertebral  arteries and carotid siphons. No hyperdense vessel identified. Skull: Normal. Negative for fracture or focal lesion. Sinuses/Orbits: No acute finding. Other:  None. ASPECTS ([place] Stroke Program Early CT Score) - Ganglionic level infarction (caudate, lentiform nuclei, internal capsule, insula, M1-M3 cortex): 7 - Supraganglionic infarction (M4-M6 cortex): 3 Total score (0-10 with 10 being normal): 10 IMPRESSION: 1. No acute intracranial abnormality identified. 2. ASPECTS is 10 3. Chronic microvascular ischemic changes and parenchymal volume loss of the brain These results were communicated to Dr. Rory Percy at [place] 5/29/2019by text page via the [facility] messaging system. Electronically Signed   By: Kristine Garbe M.D.   On: 09/14/2017 04:18      Subjective:   Discharge Exam: Vitals:   09/15/17 0416 09/15/17 0805  BP: (!) 150/87 139/87  Pulse: 72 70  Resp: 18 16  Temp: 98.2 F (36.8 C) 98.4 F (36.9 C)  SpO2: 96% 96%   Vitals:   09/14/17 2006 09/15/17 0008 09/15/17 0416 09/15/17 0805  BP: (!) 149/90 (!) 145/85 (!) 150/87 139/87  Pulse: 73 73 72 70  Resp: 18 18 18 16   Temp: 97.9 F (36.6 C) 97.8 F (36.6 C) 98.2 F (36.8 C) 98.4 F (36.9 C)  TempSrc: Oral Oral Oral Oral  SpO2: 95% 99% 96% 96%  Weight:      Height:        General: Pt is alert, awake, not in acute distress Cardiovascular: RRR, S1/S2 +, no rubs, no gallops Respiratory: CTA bilaterally, no wheezing, no rhonchi Abdominal: Soft, NT, ND, bowel sounds + Extremities: no edema, no cyanosis    The results of significant diagnostics from this hospitalization (including imaging, microbiology, ancillary and laboratory) are listed below for reference.     Microbiology: No results found for this or any previous visit (from the past 240 hour(s)).   Labs: BNP (last 3 results) Recent Labs    08/15/17 1132  BNP 401.0*   Basic Metabolic Panel: Recent Labs  Lab 09/14/17 0359 09/14/17 0406  NA 132* 131*  K  4.5 4.3  CL 98* 96*  CO2 25  --   GLUCOSE 122* 117*  BUN 18 18  CREATININE 1.03 1.00  CALCIUM 9.3  --    Liver Function Tests: Recent Labs  Lab 09/14/17 0359  AST 29  ALT 23  ALKPHOS 58  BILITOT 1.0  PROT 6.1*  ALBUMIN 3.5   No results for input(s): LIPASE, AMYLASE in the last 168 hours. No results for input(s): AMMONIA in the last 168 hours. CBC: Recent Labs  Lab 09/14/17 0359 09/14/17 0406  WBC 5.8  --   NEUTROABS 3.6  --   HGB 14.6 14.6  HCT 43.5 43.0  MCV 92.0  --   PLT 143*  --    Cardiac Enzymes: No results for input(s): CKTOTAL, CKMB, CKMBINDEX, TROPONINI in the last 168 hours. BNP: Invalid input(s): POCBNP CBG: Recent Labs  Lab 09/14/17 0401  GLUCAP 122*   D-Dimer No results for input(s): DDIMER in the last 72 hours. Hgb A1c Recent Labs    09/15/17 0627  HGBA1C 5.2   Lipid Profile Recent Labs    09/15/17 0631  CHOL 119  HDL 44  LDLCALC 67  TRIG 42  CHOLHDL 2.7   Thyroid function studies No results for input(s): TSH, T4TOTAL, T3FREE, THYROIDAB in the last 72 hours.  Invalid input(s): FREET3 Anemia work up No results for input(s): VITAMINB12, FOLATE, FERRITIN, TIBC, IRON, RETICCTPCT in the last 72 hours. Urinalysis    Component Value Date/Time   COLORURINE YELLOW 09/14/2017 [place] 09/14/2017 0420   LABSPEC 1.008 09/14/2017 [place]  7.0 09/14/2017 0420   GLUCOSEU NEGATIVE 09/14/2017 0420   HGBUR NEGATIVE 09/14/2017 0420   BILIRUBINUR NEGATIVE 09/14/2017 0420   KETONESUR NEGATIVE 09/14/2017 0420   PROTEINUR NEGATIVE 09/14/2017 0420   NITRITE NEGATIVE 09/14/2017 0420   LEUKOCYTESUR NEGATIVE 09/14/2017 0420   Sepsis Labs Invalid input(s): PROCALCITONIN,  WBC,  LACTICIDVEN Microbiology No results found for this or any previous visit (from the past 240 hour(s)).   Time coordinating discharge:  I have spent 35 minutes face to face with the patient and on the ward discussing the patients care, assessment,  plan and disposition with other care givers. >50% of the time was devoted counseling the patient about the risks and benefits of treatment/Discharge disposition and coordinating care.   SIGNED:   Damita Lack, MD  [facility] 09/15/2017, 11:54 AM Pager   If 7PM-7AM, please contact night-coverage [email or web address] Password [email or web address]
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
## 2017-09-15 NOTE — Progress Notes (Signed)
  Echocardiogram 2D Echocardiogram has been performed.  Walter Bryant 09/15/2017, 9:51 AM

## 2017-09-15 NOTE — Progress Notes (Addendum)
ANTICOAGULATION CONSULT NOTE - Initial Consult  Pharmacy Consult for heparin Indication: atrial fibrillation with stroke  Allergies  Allergen Reactions  . Other     vicroyl sutures  . Morphine Other (See Comments)    Edgy   . Oxycodone Other (See Comments)    Edgy     Patient Measurements: Height: 6\' 2"  (188 cm) Weight: 216 lb 0.8 oz (98 kg) IBW/kg (Calculated) : 82.2   Vital Signs: Temp: 98.4 F (36.9 C) (05/30 1638) Temp Source: Oral (05/30 1638) BP: 159/82 (05/30 1638) Pulse Rate: 74 (05/30 1638)  Labs: Recent Labs    09/14/17 0359 09/14/17 0406  HGB 14.6 14.6  HCT 43.5 43.0  PLT 143*  --   APTT 71*  --   LABPROT 16.8*  --   INR 1.38  --   CREATININE 1.03 1.00    Estimated Creatinine Clearance: 62.8 mL/min (by C-G formula based on SCr of 1 mg/dL).   Medical History: Past Medical History:  Diagnosis Date  . A-fib ([place])   . AAA (abdominal aortic aneurysm) ([place])   . Abnormality of gait 12/20/2013  . Anxiety and depression   . Anxiety and depression   . Arthritis   . Barrett's esophagus   . BPH (benign prostatic hyperplasia)   . Degenerative arthritis   . GERD (gastroesophageal reflux disease)   . History of concussion   . HOH (hard of hearing)   . Hyperlipidemia   . Hypertension   . Left tibial fracture   . Macular degeneration Left  . Polyneuropathy in other diseases classified elsewhere ([place]) 12/20/2013  . Right clavicle fracture   . Spinal stenosis   . Transient global amnesia   . Vertigo      Assessment: 84 yo male with atrial fibrillation on Pradaxa prior to admission. Patient sustained embolic stroke despite anticoagulation. Last dose of pradaxa was 09/15/17 at 0821. Pharmacy asked to start heparin drip to treat afib in the setting of stroke.   Goal of Therapy:  Heparin levels 0.3-0.5 Monitor platelets by anticoagulation protocol: Yes   Plan:  D/C Dabigatran (pradaxa) No heparin bolus Heparin drip at 1200 units/hr to start ~12 hours  after last dose of dabigatran (2100) Heparin level in 8 hours Heparin level and CBC daily while on heparin drip Monitor for s/sx of bleeding and CBC  Marilene Vath A. Levada Dy, PharmD, [place] Pager: [phone]  09/15/2017,5:07 PM

## 2017-09-15 NOTE — Progress Notes (Signed)
Rehab coordinator in with patient and wife was assisting patient with the food tray. [facility] just returned [facility] and got choked on water. Stopped feeding patient. Patient began having difficulty speaking and continued right sided weakness. Neuro was notified and stat CT ordered. Pt has some facial droop by this time as well. Saline bolus started and nurse attended CT with patient. Made patient NPO and speech will reassess tomorrow. Wife at bedside and orders d/c'd for CIR. Will continue to monitor pt closely. Leanne Chang, RN

## 2017-09-16 ENCOUNTER — Inpatient Hospital Stay (HOSPITAL_COMMUNITY): Payer: Medicare Other

## 2017-09-16 LAB — CBC
HEMATOCRIT: 41.6 % (ref 39.0–52.0)
HEMOGLOBIN: 14 g/dL (ref 13.0–17.0)
MCH: 31.2 pg (ref 26.0–34.0)
MCHC: 33.7 g/dL (ref 30.0–36.0)
MCV: 92.7 fL (ref 78.0–100.0)
Platelets: 145 10*3/uL — ABNORMAL LOW (ref 150–400)
RBC: 4.49 MIL/uL (ref 4.22–5.81)
RDW: 14.1 % (ref 11.5–15.5)
WBC: 8.2 10*3/uL (ref 4.0–10.5)

## 2017-09-16 LAB — HEPARIN LEVEL (UNFRACTIONATED)
HEPARIN UNFRACTIONATED: 0.43 [IU]/mL (ref 0.30–0.70)
Heparin Unfractionated: 0.26 IU/mL — ABNORMAL LOW (ref 0.30–0.70)
Heparin Unfractionated: 0.58 IU/mL (ref 0.30–0.70)

## 2017-09-16 MED ORDER — RESOURCE THICKENUP CLEAR PO POWD: Freq: Once | ORAL | Status: DC

## 2017-09-16 NOTE — Care Management Important Message (Signed)
Important Message  Patient Details  Name: Walter Bryant MRN: [id number] Date of Birth: July 14, 1930   Medicare Important Message Given:  Yes    Fabyan Loughmiller 09/16/2017, 3:18 PM

## 2017-09-16 NOTE — Progress Notes (Signed)
Inpatient Rehabilitation-Admissions Coordinator   Noted changes in medical status since afternoon of 5/30. AC will follow up with pt on Monday 6/3 for medical readiness.   Jhonnie Garner, OTR/L  Rehab Admissions Coordinator  [phone] 09/16/2017 4:31 PM

## 2017-09-16 NOTE — Progress Notes (Signed)
Pt keeps on requesting to get OOB to walk or sit in on the side of the bed; pt reminded of his right side weakness. Pt voices understanding but pt keeps on getting irritable and agitated. Pt requested for his prn ativan and medication administered. Pt resting comfortably in bed. With call light within reach. Delia Heady RN

## 2017-09-16 NOTE — Progress Notes (Signed)
Consult to Spiritual Care acknowledged. Laity, [place], of the [place] who come to the Hospital to minister to Catholics have been made aware of the Pt. -Walter Bryant- they will be visiting him today before 1300. I will come by the unit and check on he and family before the close of the day 1600.

## 2017-09-16 NOTE — Progress Notes (Signed)
ANTICOAGULATION CONSULT NOTE - Follow Up Consult  Pharmacy Consult for heparin Indication: atrial fibrillation and stroke  Labs: Recent Labs    09/14/17 0359 09/14/17 0406 09/16/17 0428 09/16/17 1325 09/16/17 2241  HGB 14.6 14.6 14.0  --   --   HCT 43.5 43.0 41.6  --   --   PLT 143*  --  145*  --   --   APTT 71*  --   --   --   --   LABPROT 16.8*  --   --   --   --   INR 1.38  --   --   --   --   HEPARINUNFRC  --   --  0.26* 0.58 0.43  CREATININE 1.03 1.00  --   --   --     Assessment/Plan:  84yo male therapeutic on heparin after rate changes. Will continue gtt at current rate and confirm stable with am labs.   Wynona Neat, PharmD, BCPS  09/16/2017,11:50 PM

## 2017-09-16 NOTE — Progress Notes (Signed)
ANTICOAGULATION CONSULT NOTE - Follow Up Consult  Pharmacy Consult for heparin Indication: atrial fibrillation and stroke  Labs: Recent Labs    09/14/17 0359 09/14/17 0406 09/16/17 0428  HGB 14.6 14.6 14.0  HCT 43.5 43.0 41.6  PLT 143*  --  145*  APTT 71*  --   --   LABPROT 16.8*  --   --   INR 1.38  --   --   HEPARINUNFRC  --   --  0.26*  CREATININE 1.03 1.00  --     Assessment: 84yo male subtherapeutic on heparin with initial dosing for acute CVA despite anticoag w/ Pradaxa.  Goal of Therapy:  Heparin level 0.3-0.5 units/ml   Plan:  Will increase heparin gtt by 1 unit/kg/hr to 1300 units/hr and check level in 8 hours.    Wynona Neat, PharmD, BCPS  09/16/2017,5:52 AM

## 2017-09-16 NOTE — Progress Notes (Signed)
PROGRESS NOTE    [facility]  OXB:[id number] DOB: 08-24-1930 DOA: 09/14/2017 PCP: Haywood Pao, MD   Brief Narrative:  84 year old male with a history of atrial fibrillation on Pradaxa, AAA, essential hypertension, TGA came to the hospital with complaints of right arm weakness and numbness.  Stroke team was consulted and patient underwent routine stroke work-up.  MRI of the brain revealed acute left basilar, right frontal, right posterior and occipital lobe infarct.  At this point patient is already on maximum therapy, therefore neurology recommended continuing Pradaxa.  Statin was changed to high intensity.  He was evaluated by speech and was thought to be an mild aspiration risk therefore recommending regular, nectar thick liquid.  Whole meds with pure.  Patient to be fed with supervision.  Physical therapy recommended patient would benefit from inpatient treatment therefore CIR was consulted. During the hospital stay patient's symptoms were very fluctuating in terms of his right upper extremity weakness and slurred speech.  Due to this it was causing a lot of anxiety for the patient therefore he was prescribed low-dose Xanax. Patient's symptoms have been fluctuating well in the hospital.  He was evaluated by physical therapy who recommended CIR.  As patient was being evaluated yesterday by CIR he developed worsening of his speech and weakness therefore he was made n.p.o. again and had CT of the head done which was negative for any brain bleeding.    Assessment & Plan:   Principal Problem:   Acute ischemic stroke ([place]) Active Problems:   HTN (hypertension)   A-fib (HCC)   Right upper extremity weakness and dysarthria, fluctuating symptoms Acute CVA-acute left basilar, right frontal, right posterior and occipital lobe infarct - Unfortunately patient is still having fluctuating symptoms along with slurred speech.  Currently he has been made n.p.o. again and will get repeat speech  and study evaluation.  Plan is to resume Pradaxa and aspirin once he is able to swallow orally.  In the meantime he is on heparin drip.  Neurology is following, appreciate input.  Continue high intensity steroids - Echocardiogram shows ejection fraction 65 to 70%, moderate dilated cardiomyopathy but no source of emboli identified. -Hemoglobin A1c was noted to be 5.2, lipid panel showed LDL of 67.  Given his fluctuating symptoms and the severity of it I spoke with the patient's family about speaking with palliative care service to establish goals of care especially if he does not do well with speech and swallow.  Consult has been placed.  Family is appreciative of this.  More family is coming in town today therefore it will help to determine this.  Persistent atrial fibrillation -Currently made n.p.o. again therefore on heparin drip.  Eventually will resume Pradaxa.  Needs to see outpatient cardiologist at the time of discharge.  Essential hypertension -Currently on hold, will resume at the time of discharge  DVT prophylaxis: On heparin drip Code Status: Full code Family Communication: Spoke with daughter at bedside Disposition Plan: Maintain inpatient stay  Consultants:   Neurology  PMNR  Procedures:   None  Antimicrobials:   None   Subjective: Patient has frequent episodes of anxiety/panic attacks given his fluctuation of symptoms of weakness and slurred speech.  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  ROS: General = no fevers, chills, dizziness, malaise, fatigue HEENT/EYES = negative for pain, redness, loss of vision, double vision, blurred vision, loss of hearing, sore throat, hoarseness, dysphagia Cardiovascular=  negative for chest pain, palpitation, murmurs, lower extremity swelling Respiratory/lungs= negative for shortness of breath, cough, hemoptysis, wheezing, mucus production Gastrointestinal=  negative for nausea, vomiting,, abdominal pain, melena, hematemesis Genitourinary= negative for Dysuria, Hematuria, Change in Urinary Frequency MSK = Negative for arthralgia, myalgias, Back Pain, Joint swelling  Neurology= slurred speech, weakness in the right upper extremity Psychiatry= Negative for anxiety, depression, suicidal and homocidal ideation Allergy/Immunology= Medication/Food allergy as listed  Skin= Negative for Rash, lesions, ulcers, itching   Objective: Vitals:   09/15/17 1945 09/15/17 2347 09/16/17 0406 09/16/17 0731  BP: (!) 159/82 (!) 145/82 137/60 (!) 118/95  Pulse: 72 73 (!) 54 65  Resp: 18 18 18 20   Temp: 98.4 F (36.9 C) 98.4 F (36.9 C) 98.2 F (36.8 C) 97.9 F (36.6 C)  TempSrc: Oral Oral Oral   SpO2: 96% 95% 96% 95%  Weight:      Height:        Intake/Output Summary (Last 24 hours) at 09/16/2017 1101 Last data filed at 09/16/2017 0841 Gross per 24 hour  Intake 3179.7 ml  Output 1500 ml  Net 1679.7 ml   Filed Weights   09/14/17 1319 09/14/17 1824  Weight: 93.9 kg (207 lb) 98 kg (216 lb 0.8 oz)    Examination:  General exam: Slightly drowsy this morning as he had just received Ativan because of severe anxiety this morning.  Easily arousable Respiratory system: Clear to auscultation. Respiratory effort normal. Cardiovascular system: S1 & S2 heard, RRR. No JVD, murmurs, rubs, gallops or clicks. No pedal edema. Gastrointestinal system: Abdomen is nondistended, soft and nontender. No organomegaly or masses felt. Normal bowel sounds heard. Central nervous system: Alert and oriented.  Right upper extremity weakness 3/5, rest of the extremities 4+/5.  Also has slurred speech Extremities: No edema Skin: No rashes, lesions or ulcers Psychiatry: Very anxious every time he wakes up.    Data Reviewed:   CBC: Recent Labs  Lab 09/14/17 0359 09/14/17 0406 09/16/17 0428  WBC 5.8  --  8.2  NEUTROABS 3.6  --   --   HGB 14.6 14.6 14.0  HCT 43.5 43.0  41.6  MCV 92.0  --  92.7  PLT 143*  --  992*   Basic Metabolic Panel: Recent Labs  Lab 09/14/17 0359 09/14/17 0406  NA 132* 131*  K 4.5 4.3  CL 98* 96*  CO2 25  --   GLUCOSE 122* 117*  BUN 18 18  CREATININE 1.03 1.00  CALCIUM 9.3  --    GFR: Estimated Creatinine Clearance: 62.8 mL/min (by C-G formula based on SCr of 1 mg/dL). Liver Function Tests: Recent Labs  Lab 09/14/17 0359  AST 29  ALT 23  ALKPHOS 58  BILITOT 1.0  PROT 6.1*  ALBUMIN 3.5   No results for input(s): LIPASE, AMYLASE in the last 168 hours. No results for input(s): AMMONIA in the last 168 hours. Coagulation Profile: Recent Labs  Lab 09/14/17 0359  INR 1.38   Cardiac Enzymes: No results for input(s): CKTOTAL, CKMB, CKMBINDEX, TROPONINI in the last 168 hours. BNP (last 3 results) No results for input(s): PROBNP in the last 8760 hours. HbA1C: Recent Labs    09/15/17 0627  HGBA1C 5.2   CBG: Recent Labs  Lab 09/14/17 0401  GLUCAP 122*   Lipid Profile: Recent Labs    09/15/17 0631  CHOL 119  HDL 44  LDLCALC 67  TRIG 42  CHOLHDL 2.7   Thyroid Function Tests: No results for input(s): TSH, T4TOTAL, FREET4, T3FREE,  THYROIDAB in the last 72 hours. Anemia Panel: No results for input(s): VITAMINB12, FOLATE, FERRITIN, TIBC, IRON, RETICCTPCT in the last 72 hours. Sepsis Labs: No results for input(s): PROCALCITON, LATICACIDVEN in the last 168 hours.  No results found for this or any previous visit (from the past 240 hour(s)).       Radiology Studies: Ct Head Wo Contrast  Result Date: 09/15/2017 CLINICAL DATA:  84 year old male with several small acute lacunar infarcts diagnosed by MRI yesterday, most notably in the left basal ganglia. EXAM: CT HEAD WITHOUT CONTRAST TECHNIQUE: Contiguous axial images were obtained from the base of the skull through the vertex without intravenous contrast. COMPARISON:  Brain MRI, CTA, CT perfusion, and plain head CT yesterday. FINDINGS: Brain: Subtle  hypodensity in the posterior left lentiform or external capsule corresponding to some of the diffusion restriction demonstrated yesterday. Elsewhere stable gray-white matter differentiation throughout the brain. No midline shift, ventriculomegaly, mass effect, evidence of mass lesion, intracranial hemorrhage or new cortically based acute infarction. Vascular: Calcified atherosclerosis at the skull base. Skull: No acute osseous abnormality identified. Sinuses/Orbits: Visualized paranasal sinuses and mastoids are stable and well pneumatized. Other: No acute orbit or scalp soft tissue findings. IMPRESSION: 1. Subtle evidence of the left basal ganglia lacunar infarct seen by MRI yesterday. No associated hemorrhage or mass effect. The other small lacunes remain occult by CT. 2. No new intracranial abnormality. Electronically Signed   By: Genevie Ann M.D.   On: 09/15/2017 14:37   Dg Swallowing Func-speech Pathology  Result Date: 09/15/2017 Objective Swallowing Evaluation: Type of Study: MBS-Modified Barium Swallow Study  Patient Details Name: Walter Bryant MRN: [id number] Date of Birth: Jul 11, 1930 Today's Date: 09/15/2017 Time: SLP Start Time (ACUTE ONLY): 45 -SLP Stop Time (ACUTE ONLY): 1200 SLP Time Calculation (min) (ACUTE ONLY): 30 min Past Medical History: Past Medical History: Diagnosis Date . A-fib ([place])  . AAA (abdominal aortic aneurysm) ([place])  . Abnormality of gait 12/20/2013 . Anxiety and depression  . Anxiety and depression  . Arthritis  . Barrett's esophagus  . BPH (benign prostatic hyperplasia)  . Degenerative arthritis  . GERD (gastroesophageal reflux disease)  . History of concussion  . HOH (hard of hearing)  . Hyperlipidemia  . Hypertension  . Left tibial fracture  . Macular degeneration Left . Polyneuropathy in other diseases classified elsewhere ([place]) 12/20/2013 . Right clavicle fracture  . Spinal stenosis  . Transient global amnesia  . Vertigo  Past Surgical History: Past Surgical History: Procedure  Laterality Date . ABDOMINAL AORTIC ANEURYSM REPAIR  2016  stent placement . APPENDECTOMY   . CATARACT EXTRACTION    Bilateral . KNEE SURGERY Bilateral   both  knees replaced . LAPAROSCOPIC RIGHT HEMI COLECTOMY   . LUMBAR LAMINECTOMY    L4  . REFRACTIVE SURGERY Left   macular degeneration . TONSILLECTOMY AND ADENOIDECTOMY   . TOTAL HIP ARTHROPLASTY Right   x 2 . 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 Pt admitted for stroke work up with MRI revealing "1. Acute left basal ganglia infarct. 2. Punctate acute infarcts in the right frontal and posterior. 3. Mild-to-moderate chronic small vessel ischemic disease and moderate cerebral atrophy." Daughter reports recent pneumonia. ED visit noted 08/13/17. Most recent CXR 5/29 shows "Worsening left basilar airspace opacity is concerning for recurrent pneumonia"  Subjective: alert, participatory  Assessment / Plan / Recommendation CHL IP CLINICAL IMPRESSIONS 09/15/2017 Clinical Impression Pt presents with a functional oropharyngeal swallow with adequate mastication, inconsistent penetration of thin liquids (Penetration-Aspiration Scale rating of #2 - penetrate is above the vocal cords and ejected from larynx upon completion of the swallow).  There was no aspiration, despite consumption of large, consecutive thin liquid boluses.  13 mm barium pill transfered through pharynx and esophagus without delay.  Recommend continuing a regular diet, advance liquids to thin, meds to be consumed with liquids.  No further SLP f/u for swallowing; speech/language evaluation pending.  SLP Visit Diagnosis Dysphagia, oropharyngeal phase (R13.12) Attention and concentration deficit following -- Frontal lobe and executive function deficit following -- Impact on safety and function No  limitations   CHL IP TREATMENT RECOMMENDATION 09/15/2017 Treatment Recommendations No treatment recommended at this time   Prognosis 09/14/2017 Prognosis for Safe Diet Advancement Good Barriers to Reach Goals -- Barriers/Prognosis Comment -- No flowsheet data found.  CHL IP OTHER RECOMMENDATIONS 09/15/2017 Recommended Consults -- Oral Care Recommendations Oral care BID Other Recommendations --   CHL IP FOLLOW UP RECOMMENDATIONS 09/15/2017 Follow up Recommendations None   No flowsheet data found.     CHL IP ORAL PHASE 09/15/2017 Oral Phase WFL Oral - Pudding Teaspoon -- Oral - Pudding Cup -- Oral - Honey Teaspoon -- Oral - Honey Cup -- Oral - Nectar Teaspoon -- Oral - Nectar Cup -- Oral - Nectar Straw -- Oral - Thin Teaspoon -- Oral - Thin Cup -- Oral - Thin Straw -- Oral - Puree -- Oral - Mech Soft -- Oral - Regular -- Oral - Multi-Consistency -- Oral - Pill -- Oral Phase - Comment --  CHL IP PHARYNGEAL PHASE 09/15/2017 Pharyngeal Phase WFL Pharyngeal- Pudding Teaspoon -- Pharyngeal -- Pharyngeal- Pudding Cup -- Pharyngeal -- Pharyngeal- Honey Teaspoon -- Pharyngeal -- Pharyngeal- Honey Cup -- Pharyngeal -- Pharyngeal- Nectar Teaspoon -- Pharyngeal -- Pharyngeal- Nectar Cup -- Pharyngeal -- Pharyngeal- Nectar Straw -- Pharyngeal -- Pharyngeal- Thin Teaspoon -- Pharyngeal -- Pharyngeal- Thin Cup -- Pharyngeal -- Pharyngeal- Thin Straw -- Pharyngeal -- Pharyngeal- Puree -- Pharyngeal -- Pharyngeal- Mechanical Soft -- Pharyngeal -- Pharyngeal- Regular -- Pharyngeal -- Pharyngeal- Multi-consistency -- Pharyngeal -- Pharyngeal- Pill -- Pharyngeal -- Pharyngeal Comment --  No flowsheet data found. No flowsheet data found. [name] 09/15/2017, 12:59 PM                   Scheduled Meds: .  stroke: mapping our early stages of recovery book   Does not apply Once  . aspirin  300 mg Rectal Daily   Or  . aspirin  325 mg Oral Daily  . calcium-vitamin D  1 tablet Oral Q breakfast  . multivitamin with minerals   1 tablet Oral Daily  . simvastatin  20 mg Oral [id number]   Continuous Infusions: . sodium chloride 75 mL/hr at 09/16/17 0325  . famotidine (PEPCID) IV Stopped (09/16/17 1056)  . heparin 1,300 Units/hr (09/16/17 0605)     LOS: 2 days    I have spent 30 minutes face to face with the patient and on the ward discussing the patients care, assessment, plan and disposition with other care givers. >50% of the time was devoted counseling the patient about the risks and benefits of treatment and coordinating care.     Alannie Amodio Arsenio Loader, MD [facility] Pager [phone]   If 7PM-7AM, please contact night-coverage [email or web address] Password [email or web address] 09/16/2017, 11:01 AM

## 2017-09-16 NOTE — Progress Notes (Signed)
Physical Therapy Treatment Patient Details Name: Walter Bryant MRN: [id number] DOB: [birth year] Today's Date: 09/16/2017    History of Present Illness Pt is an 84 y/o male who presents with RUE weakness and slurred speech. MRI revealed an acute L basilar infarct, as well as R frontal, posterior and occipital lobe infarcts. PMH significant for vertigo, R clavicle fx, polyneuropathy, macular degeneration, L tibial fx, HTN, HOH, Barrett's esophagus, a-fib, AAA.     PT Comments    Patient presents with functional and cognitive decline compared to previously noted sessions. Pt was restless and perseverating on going home and needing help. Pt oriented to person only. Pt required max A +2 for bed mobility and attempting sitting EOB. Pt returned to supine as pt could not be left safely sitting up in recliner at this time. Attempt was made to leave bed in chair position however pt was putting L LE over bed rail and sliding farther down in bed so pt repositioned with head over elevated. Daughter present end of session. Continue to progress as tolerated.   Follow Up Recommendations  CIR;Supervision/Assistance - 24 hour     Equipment Recommendations  Other (comment)(TBD by next venue of care)    Recommendations for Other Services Rehab consult     Precautions / Restrictions Precautions Precautions: Fall Restrictions Weight Bearing Restrictions: No    Mobility  Bed Mobility Overal bed mobility: Needs Assistance Bed Mobility: Supine to Sit;Sit to Supine     Supine to sit: Max assist;+2 for physical assistance;+2 for safety/equipment;HOB elevated Sit to supine: Max assist;+2 for physical assistance;+2 for safety/equipment   General bed mobility comments: assistance required to bring L LE to EOB and to elevate trunk into sitting; pt unable to achieve anterior translation of trunk when attempting to sit   Transfers                 General transfer comment: deferred this session for  safety  Ambulation/Gait                 Stairs             Wheelchair Mobility    Modified Rankin (Stroke Patients Only) Modified Rankin (Stroke Patients Only) Pre-Morbid Rankin Score: No significant disability Modified Rankin: Severe disability     Balance Overall balance assessment: Needs assistance Sitting-balance support: Feet supported;Bilateral upper extremity supported Sitting balance-Leahy Scale: Zero Sitting balance - Comments: pt required max A +2 for safety with sitting EOB; due to posterior bias and pt's restlessness returned pt to supine Postural control: Posterior lean;Right lateral lean                                  Cognition Arousal/Alertness: Awake/alert Behavior During Therapy: Restless;Impulsive Overall Cognitive Status: Impaired/Different from baseline Area of Impairment: Orientation;Attention;Memory;Following commands;Safety/judgement;Awareness;Problem solving                 Orientation Level: Disoriented to;Place;Time;Situation Current Attention Level: Focused Memory: Decreased short-term memory Following Commands: Follows one step commands inconsistently Safety/Judgement: Decreased awareness of safety;Decreased awareness of deficits Awareness: Intellectual Problem Solving: Difficulty sequencing;Requires verbal cues;Requires tactile cues General Comments: pt is very restless and perseverating on "getting out of here!" and asking for help      Exercises      General Comments General comments (skin integrity, edema, etc.): daughter present end of session and asked if pt could be sat up and this therapist explained that  at the moment it is unsafe to leave pt OOB in chair or in chair position in bed       Pertinent Vitals/Pain Pain Assessment: Faces Faces Pain Scale: Hurts little more Pain Location: unspecified Pain Descriptors / Indicators: Grimacing Pain Intervention(s): Monitored during session    Home  Living                      Prior Function            PT Goals (current goals can now be found in the care plan section) Acute Rehab PT Goals PT Goal Formulation: With patient/family Time For Goal Achievement: 09/28/17 Potential to Achieve Goals: Good Progress towards PT goals: Not progressing toward goals - comment(functional and cognitive decline)    Frequency    Min 4X/week      PT Plan Current plan remains appropriate    Co-evaluation              AM-PAC PT "6 Clicks" Daily Activity  Outcome Measure  Difficulty turning over in bed (including adjusting bedclothes, sheets and blankets)?: Unable Difficulty moving from lying on back to sitting on the side of the bed? : Unable Difficulty sitting down on and standing up from a chair with arms (e.g., wheelchair, bedside commode, etc,.)?: Unable Help needed moving to and from a bed to chair (including a wheelchair)?: Total Help needed walking in hospital room?: Total Help needed climbing 3-5 steps with a railing? : Total 6 Click Score: 6    End of Session Equipment Utilized During Treatment: Gait belt Activity Tolerance: Patient tolerated treatment well;Other (comment)(limited by impulsivity and mentation) Patient left: in bed;with call bell/phone within reach;with bed alarm set;with family/visitor present Nurse Communication: Mobility status;Other (comment)(change in function and cognition since previous session) PT Visit Diagnosis: Unsteadiness on feet (R26.81);Hemiplegia and hemiparesis;Other symptoms and signs involving the nervous system (R29.898) Hemiplegia - Right/Left: Right Hemiplegia - dominant/non-dominant: Dominant Hemiplegia - caused by: Cerebral infarction     Time: 9675-9163 PT Time Calculation (min) (ACUTE ONLY): 26 min  Charges:  $Therapeutic Activity: 23-37 mins                    G Codes:       Earney Navy, PTA Pager: [phone]     Darliss Cheney 09/16/2017, 10:31  AM

## 2017-09-16 NOTE — Progress Notes (Signed)
Occupational Therapy Treatment Patient Details Name: Walter Bryant MRN: [id number] DOB: 30-Oct-1930 Today's Date: 09/16/2017    History of present illness Pt is an 84 y/o male who presents with RUE weakness and slurred speech. MRI revealed an acute L basilar infarct, as well as R frontal, posterior and occipital lobe infarcts. PMH significant for vertigo, R clavicle fx, polyneuropathy, macular degeneration, L tibial fx, HTN, HOH, Barrett's esophagus, a-fib, AAA.    OT comments  Pt presents supine in bed, family members present, agreeable to OT tx session. Pt and family both requesting OOB to chair this session and pt motivated to participate. Pt requires MaxA+2 for bed mobility, and Max-totalA+2 for lateral/scoot transfer to drop arm recliner. Pt with significant R side weakness and demonstrating strong R lateral lean while sitting EOB, requiring max-totalA to correct and maintain upright sitting posture. Pt requires multimodal cues and increased time to follow one step commands; currently requires maxA for UB ADLs, max-totalA for LB ADLs. Pt and daughters both appreciative of assist up to chair this session. Noted via chart review that pt has demonstrated functional/cognitive decline this date compared to previous therapy sessions. Will continue to follow for progression of ADLs and mobility as well as to ensure appropriate d/c recommendations if needs change.    Follow Up Recommendations  CIR;Supervision/Assistance - 24 hour    Equipment Recommendations  Other (comment)(TBD in next venue )          Precautions / Restrictions Precautions Precautions: Fall Restrictions Weight Bearing Restrictions: No       Mobility Bed Mobility Overal bed mobility: Needs Assistance Bed Mobility: Supine to Sit     Supine to sit: Max assist;+2 for physical assistance;+2 for safety/equipment;HOB elevated     General bed mobility comments: assistance required to bring L LE to EOB and to elevate trunk  into sitting, verbal cues to use LUE on bed rail to assist   Transfers Overall transfer level: Needs assistance Equipment used: 2 person hand held assist Transfers: Lateral/Scoot Transfers          Lateral/Scoot Transfers: Max assist;Total assist;+2 physical assistance;+2 safety/equipment General transfer comment: due to significant R side weakness and very strong R lateral lean felt it was unsafe to attempt sit<>stand; completed lateral/scoot transfer to the L to drop arm recliner; multimodal cues for LUE placement with pt overall requiring max-totalA+2 for transfer completion, use of bed pad for assist during initial scoots    Balance Overall balance assessment: Needs assistance Sitting-balance support: Feet supported;Bilateral upper extremity supported;Single extremity supported Sitting balance-Leahy Scale: Zero Sitting balance - Comments: pt required max A - MaxA +2 for safety with sitting EOB; pt with very strong R lateral lean requiring max-totalA to correct at times  Postural control: Right lateral lean                                 ADL either performed or assessed with clinical judgement   ADL Overall ADL's : Needs assistance/impaired Eating/Feeding: Set up;Bed level;Sitting Eating/Feeding Details (indicate cue type and reason): supported sitting  Grooming: Set up;Bed level;Sitting Grooming Details (indicate cue type and reason): supported sitting                              Functional mobility during ADLs: +2 for physical assistance;+2 for safety/equipment;Maximal assistance;Total assistance General ADL Comments: pt's daughter present and pt/pt's daugther both  requesting OOB to chair this session. Pt with VERY strong R lateral lean. Completed lateral/scoot transfer to drop arm recliner with max-totalA+2 to the L, both parties appreciative and pt using incentive spirometer with daughter's assist while seated in recliner end of session. Due to  significant R side weakness pt currently requires max-totalA for all other UB/LB ADLs excluding simple grooming and self-feeding ADLs                Cognition Arousal/Alertness: Awake/alert Behavior During Therapy: Restless;WFL for tasks assessed/performed Overall Cognitive Status: Impaired/Different from baseline Area of Impairment: Attention;Memory;Following commands;Safety/judgement;Awareness;Problem solving                   Current Attention Level: Sustained Memory: Decreased short-term memory Following Commands: Follows one step commands inconsistently;Follows one step commands with increased time Safety/Judgement: Decreased awareness of safety;Decreased awareness of deficits Awareness: Intellectual Problem Solving: Difficulty sequencing;Requires verbal cues;Requires tactile cues;Slow processing General Comments: pt requiring multimodal cues to follow commands, but does demonstrate some awareness of events happening during session and intermittently expressing needs                    General Comments pt's daughters present during session     Pertinent Vitals/ Pain       Pain Assessment: No/denies pain Faces Pain Scale: No hurt  Home Living     Available Help at Discharge: Family Type of Home: House                              Lives With: Spouse                  Frequency  Min 2X/week        Progress Toward Goals  OT Goals(current goals can now be found in the care plan section)  Progress towards OT goals: OT to reassess next treatment  Acute Rehab OT Goals Patient Stated Goal: Return to PLOF OT Goal Formulation: With patient/family Time For Goal Achievement: 09/28/17 Potential to Achieve Goals: Good  Plan Discharge plan needs to be updated                     AM-PAC PT "6 Clicks" Daily Activity     Outcome Measure   Help from another person eating meals?: A Little Help from another person taking care of  personal grooming?: A Little Help from another person toileting, which includes using toliet, bedpan, or urinal?: Total Help from another person bathing (including washing, rinsing, drying)?: A Lot Help from another person to put on and taking off regular upper body clothing?: A Lot Help from another person to put on and taking off regular lower body clothing?: Total 6 Click Score: 12    End of Session Equipment Utilized During Treatment: Gait belt  OT Visit Diagnosis: Unsteadiness on feet (R26.81);Other abnormalities of gait and mobility (R26.89)   Activity Tolerance Patient tolerated treatment well   Patient Left in chair;with call bell/phone within reach;with family/visitor present   Nurse Communication Mobility status;Need for lift equipment        Time: 1610-9604 OT Time Calculation (min): 22 min  Charges: OT General Charges $OT Visit: 1 Visit OT Treatments $Therapeutic Activity: 8-22 mins  Lou Cal, [place] Pager [phone] 09/16/2017    Raymondo Band 09/16/2017, 3:14 PM

## 2017-09-16 NOTE — Plan of Care (Signed)
POC discussed with patient and family, denies questions/concerns at this time.

## 2017-09-16 NOTE — Progress Notes (Signed)
Stroke Team Progress Note      SUBJECTIVE  Patient is accompanied  by his  daughters in the room. The patient continues to have  dysarthria and right hemiparesis  Which is slightly improved compared to yesterday afternoon. Speech therapy has seen the patient and cleared him for a dysphagia diet. He is on IV heparin.  OBJECTIVE Most recent Vital Signs: Temp: 98.2 F (36.8 C) (05/31 1202) Temp Source: Oral (05/31 1202) BP: 146/78 (05/31 1202) Pulse Rate: 52 (05/31 1202) Respiratory Rate: 18 O2 Saturdation: 96%  CBG (last 3)  Recent Labs    09/14/17 0401  GLUCAP 122*       Studies:  CT brain : no acute abnormality MRI Brain acute left basal ganglia infarct.Punctate acute infarcts in right frontal and posterior occipital lobes as well. Mild changes of chronic small vessel disease. CTAs no significant extracranial carotid or vertebral stenosis. No significant large vessel intracranial stenosis. 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 ECHO 04/25/77 ; normal systolic function with ejection fraction 55-60%. Mitral annulus calcification. LDLpending HbA1cpending  Physical Exam:    Pleasant elderly Caucasian male currently not in distress . Afebrile. Head is nontraumatic. Neck is supple without bruit.    Cardiac exam no murmur or gallop. Lungs are clear to auscultation. Distal pulses are well felt. Neurological Exam ;  Awake  Alert oriented x 3. severely dysarthric speech   .eye movements full without nystagmus.fundi were not visualized. Vision acuity and fields appear normal. Hearing is normal. Palatal movements are normal. Face asymmetric with moderate right lower face weakness. Tongue midline.  Dense right hemiplegia  With 2/5 strength on   the right. Normal strength in the left... Normal sensation. Gait deferred.  ASSESSMENT Walter Bryant is a 84 y.o. male with  Atrial fibrillation with small bilateral cardioembolic infarct despite anticoagulation with Pradaxa.   Significant worsening of dysarthria and right hemiplegia on 09/15/17 despite being on Pradaxa and repeat CT scan shows no hemorrhage or new strokes Hospital day # 2  TREATMENT/PLAN  patient has shown slight improvement. Continue IV heparin drip for one more day.I'm not inclined to change peroxide to eliquis since patient clearly has had worsening x 2 now  despite being on Pradaxa. Discussed with patient and 2 daughters. They aren't realistic and if there is further neurological worsening, patient is unable to swallow they will be amenable to discuss palliative care options. Patient continues to improve transfer to inpatient rehabilitation in the next 1-2 days... Discussed with Dr. Reesa Chew. Greater than 50% time during this 25 minute visit was spent on counseling and coordination of care about his embolic strokes and discussion about options of anticoagulation and alternatives and answering questions   Antony Contras, MD Medical Director [place] Pager: [phone] 09/16/2017 2:12 PM

## 2017-09-16 NOTE — Progress Notes (Signed)
Pt heparin drip started and second RN Intha verified. Delia Heady RN

## 2017-09-16 NOTE — Progress Notes (Signed)
  Speech Language Pathology Treatment: Dysphagia  Patient Details Name: Walter Bryant MRN: [id number] DOB: 1931/03/12 Today's Date: 09/16/2017 Time: 1601-0932 SLP Time Calculation (min) (ACUTE ONLY): 27 min  Assessment / Plan / Recommendation Clinical Impression  Pt with no discernible improvement since yesterday afternoon - continues with moderate dysarthria, right central CN VII weakness, confusion/irritability, asking repeatedly to go home.  Swallow function is marked by inattention, delayed cough response after swallowing nectar thick liquids, decreased oral control of purees.  Recommend repeating MBS today given decline since yesterday's study.  D/W RN.  MBS pending.   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 Pt admitted for stroke work up with MRI revealing "1. Acute left basal ganglia infarct. 2. Punctate acute infarcts in the right frontal and posterior. 3. Mild-to-moderate chronic small vessel ischemic disease and moderate cerebral atrophy." Daughter reports recent pneumonia. ED visit noted 08/13/17. Most recent CXR 5/29 shows "Worsening left basilar airspace opacity is concerning for recurrent pneumonia"      SLP Plan  MBS       Recommendations  Diet recommendations: NPO                Oral Care Recommendations: Oral care QID SLP Visit Diagnosis: Dysphagia, oropharyngeal phase (R13.12) Plan: MBS       GO                Walter Bryant 09/16/2017, 9:35 AM

## 2017-09-16 NOTE — Progress Notes (Signed)
ANTICOAGULATION CONSULT NOTE - Follow-Up Consult  Pharmacy Consult for heparin Indication: atrial fibrillation with stroke  Patient Measurements: Height: 6\' 2"  (188 cm) Weight: 216 lb 0.8 oz (98 kg) IBW/kg (Calculated) : 82.2  Heparin Dosing Wt: 98 kg  Vital Signs: Temp: 98.2 F (36.8 C) (05/31 1202) Temp Source: Oral (05/31 1202) BP: 146/78 (05/31 1202) Pulse Rate: 52 (05/31 1202)  Labs: Recent Labs    09/14/17 0359 09/14/17 0406 09/16/17 0428 09/16/17 1325  HGB 14.6 14.6 14.0  --   HCT 43.5 43.0 41.6  --   PLT 143*  --  145*  --   APTT 71*  --   --   --   LABPROT 16.8*  --   --   --   INR 1.38  --   --   --   HEPARINUNFRC  --   --  0.26* 0.58  CREATININE 1.03 1.00  --   --     Estimated Creatinine Clearance: 62.8 mL/min (by C-G formula based on SCr of 1 mg/dL).   Assessment: 84 yo male with atrial fibrillation on Pradaxa prior to admission. Patient sustained embolic stroke despite anticoagulation. Last dose of pradaxa was 09/15/17 at 0821. Pharmacy asked to start heparin drip to treat afib in the setting of stroke.   Heparin level this afternoon resulted as slightly SUPRAtherpeutic of the lower goal range with CVA (HL 0.58, goal of 0.3-0.5). Current rate is 1300 units/hr. Of noting, the patient was slightly SUBtherapeutic at 1200 units/hr - will aim for the middle of these two rate and recheck a heparin level in 8 hours.   Goal of Therapy:  Heparin levels 0.3-0.5 Monitor platelets by anticoagulation protocol: Yes   Plan:  - Reduce Heparin to 1250 units/hr (12.5 ml/hr - Will continue to monitor for any signs/symptoms of bleeding and will follow up with heparin level in 8 hours   Thank you for allowing pharmacy to be a part of this patient's care.  Alycia Rossetti, PharmD, BCPS Clinical Pharmacist Pager: [phone] Clinical phone for 09/16/2017 from 7a-3:30p: [phone] If after 3:30p, please call main pharmacy at: [phone] 09/16/2017 2:28 PM

## 2017-09-16 NOTE — Evaluation (Signed)
Speech Language Pathology Evaluation Patient Details Name: Walter Bryant MRN: [id number] DOB: 03-Jun-1930 Today's Date: 09/16/2017 Time: 7829-5621 SLP Time Calculation (min) (ACUTE ONLY): 75 min  Problem List:  Patient Active Problem List   Diagnosis Date Noted  . Acute ischemic stroke ([place]) 09/14/2017  . HTN (hypertension) 09/14/2017  . A-fib ([place]) 09/14/2017  . Hyponatremia 08/17/2017  . Fluid overload 08/16/2017  . Anxiety 08/16/2017  . CAP (community acquired pneumonia) 08/15/2017  . Polyneuropathy in other diseases classified elsewhere ([place]) 12/20/2013  . Abnormality of gait 12/20/2013  . Non-healing right groin open wound 04/09/2013   Past Medical History:  Past Medical History:  Diagnosis Date  . A-fib ([place])   . AAA (abdominal aortic aneurysm) ([place])   . Abnormality of gait 12/20/2013  . Anxiety and depression   . Anxiety and depression   . Arthritis   . Barrett's esophagus   . BPH (benign prostatic hyperplasia)   . Degenerative arthritis   . GERD (gastroesophageal reflux disease)   . History of concussion   . HOH (hard of hearing)   . Hyperlipidemia   . Hypertension   . Left tibial fracture   . Macular degeneration Left  . Polyneuropathy in other diseases classified elsewhere ([place]) 12/20/2013  . Right clavicle fracture   . Spinal stenosis   . Transient global amnesia   . Vertigo    Past Surgical History:  Past Surgical History:  Procedure Laterality Date  . ABDOMINAL AORTIC ANEURYSM REPAIR  2016   stent placement  . APPENDECTOMY    . CATARACT EXTRACTION     Bilateral  . KNEE SURGERY Bilateral    both  knees replaced  . LAPAROSCOPIC RIGHT HEMI COLECTOMY    . LUMBAR LAMINECTOMY     L4   . REFRACTIVE SURGERY Left    macular degeneration  . TONSILLECTOMY AND ADENOIDECTOMY    . TOTAL HIP ARTHROPLASTY Right    x 2  . 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 Pt admitted for stroke work up with MRI revealing "1. Acute left basal ganglia infarct. 2. Punctate acute infarcts in the right frontal and posterior. 3. Mild-to-moderate chronic small vessel ischemic disease and moderate cerebral atrophy." Daughter reports recent pneumonia. ED visit noted 08/13/17. Most recent CXR 5/29 shows "Worsening left basilar airspace opacity is concerning for recurrent pneumonia." Pt underwent MBS 5/30, with results revealing functional swallow, recs for regular diet, thin liquids.  Pt with subsequent decline in neuro function that afternoon with worsening dysarthria and CN deficits.    Assessment / Plan / Recommendation Clinical Impression  Pt presents with a moderate dysarthria of speech.  Fluency, expressive and receptive language are [facility].  Pt with increased confusion and irritability today; generally oriented to person/surroundings, but with increased anxiety.  Recommend acute care SLP to address dysarthria of speech.  Will follow.     SLP Assessment  SLP Recommendation/Assessment: Patient needs continued Speech Lanaguage Pathology Services SLP Visit Diagnosis: Dysarthria and anarthria (R47.1)    Follow Up Recommendations  Other (comment)(tba)    Frequency and Duration min 2x/week  2 weeks      SLP Evaluation Cognition  Overall Cognitive Status: Impaired/Different from baseline Arousal/Alertness: Awake/alert Orientation Level: Disoriented to situation Attention: Focused Focused Attention: Appears intact       Comprehension  Auditory Comprehension Overall Auditory Comprehension: Appears within functional limits for tasks assessed Reading Comprehension Reading Status: Not tested    Expression Expression Primary Mode of Expression: Verbal Verbal Expression Overall Verbal Expression: Appears within functional limits for tasks assessed Written  Expression Dominant Hand: Right Written Expression: Not tested   Oral / Motor  Oral Motor/Sensory Function Overall Oral Motor/Sensory Function: Moderate impairment Facial ROM: Reduced right Facial Symmetry: Abnormal symmetry right;Suspected CN VII (facial) dysfunction Facial Strength: Reduced right Facial Sensation: Reduced right;Suspected CN V (Trigeminal) dysfunction Lingual ROM: Reduced right;Suspected CN XII (hypoglossal) dysfunction Lingual Symmetry: Abnormal symmetry right;Suspected CN XII (hypoglossal) dysfunction Motor Speech Overall Motor Speech: Impaired Respiration: Impaired Level of Impairment: Sentence Phonation: Normal Resonance: Within functional limits Articulation: Impaired 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

## 2017-09-17 DIAGNOSIS — Z515 Encounter for palliative care: Secondary | ICD-10-CM

## 2017-09-17 DIAGNOSIS — R066 Hiccough: Secondary | ICD-10-CM

## 2017-09-17 DIAGNOSIS — Z7189 Other specified counseling: Secondary | ICD-10-CM

## 2017-09-17 LAB — CBC
HCT: 41.8 % (ref 39.0–52.0)
Hemoglobin: 14.2 g/dL (ref 13.0–17.0)
MCH: 31.6 pg (ref 26.0–34.0)
MCHC: 34 g/dL (ref 30.0–36.0)
MCV: 92.9 fL (ref 78.0–100.0)
PLATELETS: 130 10*3/uL — AB (ref 150–400)
RBC: 4.5 MIL/uL (ref 4.22–5.81)
RDW: 14 % (ref 11.5–15.5)
WBC: 7.7 10*3/uL (ref 4.0–10.5)

## 2017-09-17 LAB — HEPARIN LEVEL (UNFRACTIONATED): HEPARIN UNFRACTIONATED: 0.5 [IU]/mL (ref 0.30–0.70)

## 2017-09-17 MED ORDER — ASPIRIN EC 81 MG PO TBEC
81.0000 mg | DELAYED_RELEASE_TABLET | Freq: Every day | ORAL | Status: DC
  Administered 2017-09-18 – 2017-09-19 (×2): 81 mg via ORAL
  Filled 2017-09-17 (×2): qty 1

## 2017-09-17 MED ORDER — APIXABAN 5 MG PO TABS
5.0000 mg | ORAL_TABLET | Freq: Two times a day (BID) | ORAL | Status: DC
  Administered 2017-09-17 – 2017-09-19 (×4): 5 mg via ORAL
  Filled 2017-09-17 (×4): qty 1

## 2017-09-17 MED ORDER — ENSURE ENLIVE PO LIQD
237.0000 mL | Freq: Two times a day (BID) | ORAL | Status: DC
  Administered 2017-09-18 – 2017-09-19 (×4): 237 mL via ORAL

## 2017-09-17 MED ORDER — BOOST / RESOURCE BREEZE PO LIQD CUSTOM
1.0000 | Freq: Three times a day (TID) | ORAL | Status: DC
  Administered 2017-09-17 – 2017-09-19 (×5): 1 via ORAL
  Administered 2017-09-19: 13:00:00 via ORAL

## 2017-09-17 NOTE — Progress Notes (Signed)
Stroke Team Progress Note   SUBJECTIVE Patient wife and daughter are at bedside. Pt is sitting in chair.  Stated that his speech has some improvement, and his right arm and leg muscle strength also improved.  Swelling better today, pending speech evaluation to advance diet.  Still on heparin drip IV, will convert to Eliquis.  Continue aspirin 81 on top of Eliquis.  OBJECTIVE Most recent Vital Signs: Temp: 97.6 F (36.4 C) (06/01 1202) Temp Source: Oral (06/01 1202) BP: 147/86 (06/01 1202) Pulse Rate: 53 (06/01 1202) Respiratory Rate: (!) 22 O2 Saturdation: 98%  CBG (last 3)  No results for input(s): GLUCAP in the last 72 hours.    IMAGING I have personally reviewed the radiological images below and agree with the radiology interpretations.  Ct Angio Head W Or Wo Contrast Ct Angio Neck W Or Wo Contrast Ct Cerebral Perfusion W Contrast 09/14/2017 IMPRESSION:  1. Patent carotid and vertebral arteries. No dissection, aneurysm, or hemodynamically significant stenosis utilizing NASCET criteria.  2. Patent anterior and posterior intracranial circulation. No large vessel occlusion or high-grade stenosis.  3. 5 mm anterior communicating artery aneurysm.  4. No evidence for focal acute stroke on perfusion imaging.  Ct Head Code Stroke Wo Contrast 09/14/2017 IMPRESSION:  1. No acute intracranial abnormality identified.  2. ASPECTS is 10  3. Chronic microvascular ischemic changes and parenchymal volume loss of the brain   Mr Brain Wo Contrast 09/14/2017 IMPRESSION:  1. Acute left basal ganglia infarct.  2. Punctate acute infarcts in the right frontal and posterior occipital lobes.  3. Mild-to-moderate chronic small vessel ischemic disease and moderate cerebral atrophy.   Ct Head Wo Contrast 09/15/2017 IMPRESSION:  1. Subtle evidence of the left basal ganglia lacunar infarct seen by MRI yesterday. No associated hemorrhage or mass effect. The other small lacunes remain occult by CT.   2. No new intracranial abnormality.   Dg Chest 2 View 09/14/2017 IMPRESSION:  Worsening left basilar airspace opacity is concerning for recurrent pneumonia. Mild vascular congestion noted.   ECHO  08/17/17  - Left ventricle: The cavity size was normal. Systolic function was   vigorous. The estimated ejection fraction was in the range of 65%   to 70%. Wall motion was normal; there were no regional wall   motion abnormalities. - Aortic valve: There was mild regurgitation. - Mitral valve: Mildly calcified annulus. There was moderate   regurgitation. - Left atrium: The atrium was moderately dilated. - Right atrium: The atrium was mildly to moderately dilated. - Tricuspid valve: There was mild-moderate regurgitation. - Pulmonary arteries: Systolic pressure was moderately increased.   PA peak pressure: 57 mm Hg (S). Impressions: - No cardiac source of emboli was indentified.   PHYSICAL EXAM  Vitals:   09/16/17 2321 09/17/17 0245 09/17/17 0805 09/17/17 1202  BP: (!) 170/73 (!) 161/96 (!) 138/91 (!) 147/86  Pulse: 60 (!) 58 (!) 54 (!) 53  Resp: 18 (!) 22    Temp: 98.1 F (36.7 C) 97.7 F (36.5 C) 97.9 F (36.6 C) 97.6 F (36.4 C)  TempSrc: Oral Oral Oral Oral  SpO2: 95% 96% 98% 98%  Weight:      Height:        Pleasant elderly Caucasian male currently not in distress . Afebrile. Head is nontraumatic. Neck is supple without bruit.    Cardiac exam no murmur or gallop. Lungs are clear to auscultation. Distal pulses are well felt. Neurological Exam ;  Awake  Alert oriented x 3. Moderate dysarthric speech .  eye movements full without nystagmus.fundi were not visualized. Vision acuity and fields appear normal. Hearing is normal. Palatal movements are normal. Face asymmetric with moderate right lower face weakness. Tongue midline. Right hemiparesis with 2/5 on the right UE, and 3/5 RLE. Normal strength in the left. Normal sensation. No ataxia on the left. Gait deferred.    ASSESSMENT Walter Bryant is a 84 y.o. male with a history of vertigo, spinal stenosis, polyneuropathy, hypertension, hyperlipidemia, hard of hearing, abdominal aortic aneurysm, anxiety, depression and atrial fibrillation with small bilateral cardioembolic infarcts despite anticoagulation with Pradaxa.  Significant worsening of dysarthria and right hemiplegia on 09/15/17 despite being on Pradaxa.  Stroke: Multiple bilateral infarcts - consistent with cardiac emboli with history of atrial fibrillation on pradaxa.  Resultant  Right hemiparesis, dysarthria  CT head - Subtle evidence of the left basal ganglia lacunar infarct seen by MRI .  MRI head - Acute left insular cortex and CR, right MCA/ACA, and b/l MCA/PCA infarcts.   CTA H&N -  5 mm ACOM aneurysm.   2D Echo - EF 65 to 70%.  No cardiac source of emboli identified.  LDL 67  HgbA1c - 5.2  VTE prophylaxis - IV heparin  Pradaxa and aspirin 81 mg daily prior to admission, now on  IV heparin and aspirin suppositories 300 mg. As plan, will change IV heparin to eliquis and continue baby ASA for stroke prevention.   Patient will be counseled to be compliant with his antithrombotic medications  Ongoing aggressive stroke risk factor management  Therapy recommendations:  CIR  Disposition:  Pending  Afib on pradaxa  Stated compliant with pradaxa  Now on heparin IV  Neuro stable and improved  Will change IV heparin to eliquis but also continue baby 17  Hypertension  Stable . Long-term BP goal normotensive  Hyperlipidemia  Lipid lowering medication PTA: Zocor 20 mg daily  LDL 67, goal < 70  Current lipid lowering medication: Zocor 20 mg daily  Continue statin at discharge  Dysphagia  On nectar thick liquid  Speech to follow  Advance diet once able  Other Stroke Risk Factors  Advanced age  Former smoker quit 51 years ago  Other Active Problems  Hyponatremia - chronic  Hospital day # 3     Neurology will sign off. Please call with questions. Pt will follow up with stroke clinic NP at [facility] in about 4 weeks. Thanks for the consult.  Rosalin Hawking, MD PhD Stroke Neurology 09/18/2017 2:57 AM

## 2017-09-17 NOTE — Consult Note (Signed)
Consultation Note Date: 09/17/2017   Patient Name: Walter Bryant  DOB: 11/07/1930  MRN: [id number]  Age / Sex: 84 y.o., male  PCP: Tisovec, Fransico Him, MD Referring Physician: Cristal Deer, MD  Reason for Consultation: Establishing goals of care and Psychosocial/spiritual support  HPI/Patient Profile: 84 y.o. male  with past medical history of A. fib, hypertension, hyperlipidemia, AAA, BPH admitted on 09/14/2017 with altered mental status.  Patient was found to have an acute left basal ganglia, right frontal, right posterior CVA; moderate cerebral atrophy.  Consult ordered for goals of care.   Clinical Assessment and Goals of Care: Patient seen, chart reviewed.  Patient's wife as well as children, grandchildren at the bedside.  Patient was admitted on 09/14/2017, on 09/15/2017 developed worsening speech, increased weakness.  When seen today, 09/17/2017, patient is now more alert, oriented to person place and situation, and following simple commands.  He also has been cleared for a dysphasia 1 diet with thick liquids but with mental status improving, neurology is hopeful for diet advancement.  Family is hopeful for CIR, rehab with the goal to return to independent living at [facility] as an additional resource and source of support; specialized medical care for people with serious illness with a focus on improving quality of life, symptom management.  Patient states that he does have advanced directives that he and his wife filled out approximately 4 years ago.  I did introduce the topic of CODE STATUS as well as defining terms.  Walter Bryant remains a full code by choice versus default; although he then shares he would not want to live on machines.  Patient is able to participate in goals of care discussion himself today.  His healthcare proxy would be his wife, Walter Bryant  [facility] [phone]  Engaged in brief life review: Patient and family are from the [place] area and have resided in [place] most of their lives.  Walter Bryant a retired Stage manager    SUMMARY [place] full code full scope of treatment Patient and family hopeful for transfer to CIR the first of the week then with subsequent return to [place]:  Full code    Symptom Management:   Hiccups: Patient is having intermittent hiccups which are resolving by him holding his breath and bearing down.  Discussed medications to manage hiccups but would be concerned that medications such as baclofen, Thorazine would alter patient's mental status and confuse his clinical picture in terms of recovery versus med effect.  Reglan also is an option should hiccups worsen, remain consistent  Palliative Prophylaxis:   Aspiration, Bowel Regimen, Delirium Protocol, Eye Care, Frequent Pain Assessment, Oral Care and Turn Reposition  Additional Recommendations (Limitations, Scope, Preferences):  Full Scope Treatment  Psycho-social/Spiritual:   Desire for further Chaplaincy support:no  Additional Recommendations: Referral to Community Resources   Prognosis:   Unable to determine  Discharge Planning: CIR     Primary Diagnoses: Present on Admission: . Acute  ischemic stroke ([place]) . HTN (hypertension) . A-fib [facility])   I have reviewed the medical record, interviewed the patient and family, and examined the patient. The following aspects are pertinent.  Past Medical History:  Diagnosis Date  . A-fib ([place])   . AAA (abdominal aortic aneurysm) ([place])   . Abnormality of gait 12/20/2013  . Anxiety and depression   . Anxiety and depression   . Arthritis   . Barrett's esophagus   . BPH (benign prostatic hyperplasia)   . Degenerative arthritis   . GERD (gastroesophageal reflux disease)   . History of concussion   . HOH (hard of hearing)     . Hyperlipidemia   . Hypertension   . Left tibial fracture   . Macular degeneration Left  . Polyneuropathy in other diseases classified elsewhere ([place]) 12/20/2013  . Right clavicle fracture   . Spinal stenosis   . Transient global amnesia   . Vertigo    Social History   Socioeconomic History  . Marital status: Married    Spouse name: Not on file  . Number of children: 4  . Years of education: Not on file  . Highest education level: Not on file  Occupational History  . Occupation: retired/phy/radiologist  Social Needs  . Financial resource strain: Not on file  . Food insecurity:    Worry: Not on file    Inability: Not on file  . Transportation needs:    Medical: Not on file    Non-medical: Not on file  Tobacco Use  . Smoking status: Former Smoker    Last attempt to quit: 04/19/1966    Years since quitting: 51.4  . Smokeless tobacco: Never Used  Substance and Sexual Activity  . Alcohol use: No  . Drug use: No  . Sexual activity: Not on file  Lifestyle  . Physical activity:    Days per week: Not on file    Minutes per session: Not on file  . Stress: Not on file  Relationships  . Social connections:    Talks on phone: Not on file    Gets together: Not on file    Attends religious service: Not on file    Active member of club or organization: Not on file    Attends meetings of clubs or organizations: Not on file    Relationship status: Not on file  Other Topics Concern  . Not on file  Social History Narrative  . Not on file   Family History  Problem Relation Age of Onset  . Heart disease Sister        AFIB  . Neuropathy Neg Hx    Scheduled Meds: .  stroke: mapping our early stages of recovery book   Does not apply Once  . aspirin  300 mg Rectal Daily   Or  . aspirin  325 mg Oral Daily  . calcium-vitamin D  1 tablet Oral Q breakfast  . multivitamin with minerals  1 tablet Oral Daily  . simvastatin  20 mg Oral [id number]   Continuous Infusions: . sodium  chloride 75 mL/hr at 09/17/17 0734  . famotidine (PEPCID) IV Stopped (09/17/17 1017)  . heparin 1,250 Units/hr (09/16/17 1440)   PRN Meds:.acetaminophen **OR** acetaminophen (TYLENOL) oral liquid 160 mg/5 mL **OR** acetaminophen, ALPRAZolam, LORazepam, RESOURCE THICKENUP CLEAR, senna-docusate, traZODone Medications Prior to Admission:  Prior to Admission medications   Medication Sig Start Date End Date Taking? Authorizing Provider  ALPRAZolam (XANAX) 0.25 MG tablet Take 1 tablet (0.25 mg total)  by mouth daily as needed for anxiety. 08/17/17  Yes Elwyn Reach, MD  Ascorbic Acid (VITAMIN C) 1000 MG tablet Take 1,000 mg by mouth daily.   Yes [provider]  aspirin 81 MG tablet Take 81 mg by mouth at bedtime.    Yes [provider]  Calcium-Magnesium-Vitamin D (CALCIUM 500 PO) Take 1 tablet by mouth daily.   Yes [provider]  dabigatran (PRADAXA) 150 MG CAPS capsule Take 150 mg by mouth 2 (two) times daily.   Yes [provider]  fexofenadine (ALLEGRA) 180 MG tablet Take 180 mg by mouth every morning.   Yes [provider]  fluticasone (FLONASE) 50 MCG/ACT nasal spray Place 2 sprays into both nostrils daily for 7 days. Patient taking differently: Place 2 sprays into both nostrils daily as needed for allergies.  08/13/17 09/14/17 Yes Long, Wonda Olds, MD  furosemide (LASIX) 20 MG tablet Take 1 tablet (20 mg total) by mouth daily. 08/17/17  Yes Elwyn Reach, MD  ibuprofen (ADVIL,MOTRIN) 200 MG tablet Take 200-600 mg by mouth every 6 (six) hours as needed for moderate pain.    Yes [provider]  lisinopril (PRINIVIL,ZESTRIL) 5 MG tablet Take 5 mg by mouth daily after lunch.    Yes [provider]  LUTEIN PO Take 25 mg by mouth daily after lunch.    Yes [provider]  metoprolol succinate (TOPROL-XL) 50 MG 24 hr tablet Take 50 mg by mouth 2 (two) times daily. Take with or immediately following a meal.    Yes [provider]  Multiple Vitamin (MULTIVITAMIN) tablet Take 1 tablet by mouth daily.   Yes [provider]  Omega-3 Fatty Acids (OMEGA 3 PO) Take 1 tablet by mouth daily after lunch.    Yes [provider]  omeprazole (PRILOSEC) 20 MG capsule Take 20 mg by mouth every morning.    Yes [provider]  POTASSIUM PO Take 1 tablet by mouth daily after lunch.    Yes [provider]  pregabalin (LYRICA) 75 MG capsule Take 75 mg by mouth 4 (four) times daily as needed (pain).    Yes [provider]  ranitidine (ZANTAC) 150 MG tablet Take 150 mg by mouth 2 (two) times daily. Take in the afternoon and bedtime   Yes [provider]  Selenium 200 MCG TABS Take 1 tablet by mouth daily after lunch.    Yes [provider]  simvastatin (ZOCOR) 20 MG tablet Take 20 mg by mouth every morning.  01/09/13  Yes [provider]  sodium chloride (OCEAN) 0.65 % SOLN nasal spray Place 1 spray into both nostrils every morning.   Yes [provider]  tamsulosin (FLOMAX) 0.4 MG CAPS capsule Take 0.4 mg by mouth at bedtime.    Yes [provider]  zinc gluconate 50 MG tablet Take 50 mg by mouth every morning.    Yes [provider]  atorvastatin (LIPITOR) 80 MG tablet Take 1 tablet (80 mg total) by mouth daily. 09/15/17 09/15/18  Damita Lack, MD   Allergies  Allergen Reactions  . Other     vicroyl sutures  . Morphine Other (See Comments)    Edgy   . Oxycodone Other (See Comments)    Edgy    Review of Systems  Unable to perform ROS: Other    Physical Exam  Constitutional: He appears well-developed and well-nourished.  HENT:  Head: Normocephalic and atraumatic.  Cardiovascular: Normal rate.  Pulmonary/Chest:  Effort normal.  Abdominal: Soft.  Neurological:  Right-sided weakness Mild dysarthria  Skin: Skin is warm and dry.  Psychiatric:  No overt agitation Patient is alert, oriented to self, understands that  he has atrial fib and has had a stroke and is in the hospital  Nursing note and vitals reviewed.   Vital Signs: BP (!) 147/86 (BP Location: Right Arm)   Pulse (!) 53   Temp 97.6 F (36.4 C) (Oral)   Resp (!) 22   Ht 6\' 2"  (1.88 m)   Wt 98 kg (216 lb 0.8 oz)   SpO2 98%   BMI 27.74 kg/m  Pain Scale: Faces   Pain Score: 0-No pain   SpO2: SpO2: 98 % O2 Device:SpO2: 98 % O2 Flow Rate: .   IO: Intake/output summary:   Intake/Output Summary (Last 24 hours) at 09/17/2017 1536 Last data filed at 09/17/2017 1165 Gross per 24 hour  Intake 433.95 ml  Output 400 ml  Net 33.95 ml    LBM: Last BM Date: 09/13/17 Baseline Weight: Weight: 93.9 kg (207 lb) Most recent weight: Weight: 98 kg (216 lb 0.8 oz)     Palliative Assessment/Data:   Flowsheet Rows     Most Recent Value  Intake Tab  Referral Department  Hospitalist  Unit at Time of Referral  Med/Surg Unit  Palliative Care Primary Diagnosis  Neurology  Date Notified  09/16/17  Reason for referral  Clarify Goals of Care  Date of Admission  10/15/17  Date first seen by Palliative Care  09/17/17  # of days Palliative referral response time  1 Day(s)  # of days IP prior to Palliative referral  -29  Clinical Assessment  Palliative Performance Scale Score  40%  Pain Max last 24 hours  Not able to report  Pain Min Last 24 hours  Not able to report  Dyspnea Max Last 24 Hours  Not able to report  Dyspnea Min Last 24 hours  Not able to report  Nausea Max Last 24 Hours  Not able to report  Nausea Min Last 24 Hours  Not able to report  Anxiety Max Last 24 Hours  Not able to report  Anxiety Min Last 24 Hours  Not able to report  Other Max Last 24 Hours  Not able to report  Psychosocial & Spiritual Assessment  Palliative Care Outcomes  Patient/Family meeting held?  Yes  Who was at the meeting?  pt, wife, children  Palliative Care follow-up planned  Yes, Facility      Time In: 7903 Time Out: 1550 Time Total: 65 min Greater than  50%  of this time was spent counseling and coordinating care related to the above assessment and plan.  Signed by: Dory Horn, NP   Please contact Palliative Medicine Team phone at [phone] for questions and concerns.  For individual provider: See Shea Evans

## 2017-09-17 NOTE — Plan of Care (Signed)
  Problem: Education: Goal: Knowledge of General Education information will improve Outcome: Progressing   Problem: Health Behavior/Discharge Planning: Goal: Ability to manage health-related needs will improve Outcome: Progressing   Problem: Clinical Measurements: Goal: Ability to maintain clinical measurements within normal limits will improve Outcome: Progressing Goal: Will remain free from infection Outcome: Progressing Goal: Diagnostic test results will improve Outcome: Progressing Goal: Respiratory complications will improve Outcome: Progressing Goal: Cardiovascular complication will be avoided Outcome: Progressing   Problem: Activity: Goal: Risk for activity intolerance will decrease Outcome: Progressing   Problem: Nutrition: Goal: Adequate nutrition will be maintained Outcome: Progressing   Problem: Coping: Goal: Level of anxiety will decrease Outcome: Progressing   Problem: Elimination: Goal: Will not experience complications related to bowel motility Outcome: Progressing Goal: Will not experience complications related to urinary retention Outcome: Progressing   Problem: Safety: Goal: Ability to remain free from injury will improve Outcome: Progressing   Problem: Skin Integrity: Goal: Risk for impaired skin integrity will decrease Outcome: Progressing   Problem: Education: Goal: Knowledge of disease or condition will improve Outcome: Progressing Goal: Knowledge of secondary prevention will improve Outcome: Progressing Goal: Knowledge of patient specific risk factors addressed and post discharge goals established will improve Outcome: Progressing   Problem: Coping: Goal: Will verbalize positive feelings about self Outcome: Progressing   Problem: Health Behavior/Discharge Planning: Goal: Ability to manage health-related needs will improve Outcome: Progressing   Problem: Self-Care: Goal: Ability to participate in self-care as condition permits will  improve Outcome: Progressing Goal: Verbalization of feelings and concerns over difficulty with self-care will improve Outcome: Progressing   Problem: Ischemic Stroke/TIA Tissue Perfusion: Goal: Complications of ischemic stroke/TIA will be minimized Outcome: Progressing

## 2017-09-17 NOTE — Progress Notes (Signed)
PROGRESS NOTE    Walter Bryant [facility]  ZJI:[id number] DOB: 11-09-1930 DOA: 09/14/2017 PCP: Haywood Pao, MD   Brief Narrative:  84 year old male with a history of atrial fibrillation on Pradaxa, AAA, essential hypertension, TGA came to the hospital with complaints of right arm weakness and numbness.  Stroke team was consulted and patient underwent routine stroke work-up.  MRI of the brain revealed acute left basilar, right frontal, right posterior and occipital lobe infarct.  At this point patient is already on maximum therapy, therefore neurology recommended continuing Pradaxa.  Statin was changed to high intensity.  He was evaluated by speech and was thought to be an mild aspiration risk therefore recommending regular, nectar thick liquid.  Whole meds with pure.  Patient to be fed with supervision.  Physical therapy recommended patient would benefit from inpatient treatment therefore CIR was consulted. During the hospital stay patient's symptoms were very fluctuating in terms of his right upper extremity weakness and slurred speech.  Due to this it was causing a lot of anxiety for the patient therefore he was prescribed low-dose Xanax. Patient's symptoms have been fluctuating well in the hospital.  He was evaluated by physical therapy who recommended CIR.  As patient was being evaluated yesterday by CIR he developed worsening of his speech and weakness therefore he was made n.p.o. again and had CT of the head done which was negative for any brain bleeding.  September 17, 2017: Patient is experiencing increased anxiety and family is quite concerned asking for something long acting for anxiety control..  I will start him on BuSpar in addition to the as needed Xanax Also reports poor p.o. intake.  I will add Ensure with nectar thick liquid per recommendation of the speech therapist Complains of hiccups but wife would like to hold off starting him on anything since it is not that bad but it is quite  frequent Neurology is following Assessment & Plan:   Principal Problem:   Acute ischemic stroke [facility]) Active Problems:   HTN (hypertension)   A-fib ([place])   Right upper extremity weakness and dysarthria, fluctuating symptoms Acute CVA-acute left basilar, right frontal, right posterior and occipital lobe infarct - Unfortunately patient is still having fluctuating symptoms along with slurred speech.  Currently he has been made n.p.o. again and will get repeat speech and study evaluation.  Plan is to resume Pradaxa and aspirin once he is able to swallow orally.  In the meantime he is on heparin drip.  Neurology is following, appreciate input.  Continue high intensity steroids - Echocardiogram shows ejection fraction 65 to 70%, moderate dilated cardiomyopathy but no source of emboli identified. -Hemoglobin A1c was noted to be 5.2, lipid panel showed LDL of 67.  Given his fluctuating symptoms and the severity of it I spoke with the patient's family about speaking with palliative care service to establish goals of care especially if he does not do well with speech and swallow.  Consult has been placed.  Family is appreciative of this.  More family is coming in town today therefore it will help to determine this.  Persistent atrial fibrillation -Currently made n.p.o. again therefore on heparin drip.  Eventually will resume Pradaxa.  Needs to see outpatient cardiologist at the time of discharge.  Essential hypertension -Currently on hold, will resume at the time of discharge  -Anxiety    I will start him on BuSpar in addition to the as needed Xanax  DVT prophylaxis: On heparin drip Code Status: Full code Family  Communication: Spoke with daughter at bedside Disposition Plan: Maintain inpatient stay  Consultants:   Neurology  PMNR  Procedures:   None  Antimicrobials:   None   Subjective: Patient has frequent episodes of anxiety/panic attacks given his fluctuation of symptoms  of weakness and slurred speech which makes him more anxious. family is quite concerned asking for something long acting for anxiety control..  I will start him on BuSpar in addition to the as needed Xanax   ROS: General = no fevers, chills, dizziness, malaise, fatigue HEENT/EYES = negative for pain, redness, loss of vision, double vision, blurred vision, loss of hearing, sore throat, hoarseness, dysphagia Cardiovascular= negative for chest pain, palpitation, murmurs, lower extremity swelling Respiratory/lungs= negative for shortness of breath, cough, hemoptysis, wheezing, mucus production Gastrointestinal= negative for nausea, vomiting,, abdominal pain, melena, hematemesis Genitourinary= negative for Dysuria, Hematuria, Change in Urinary Frequency MSK = Negative for arthralgia, myalgias, Back Pain, Joint swelling  Neurology= slurred speech, weakness in the right upper extremity Psychiatry= Negative for anxiety, depression, suicidal and homocidal ideation Allergy/Immunology= Medication/Food allergy as listed  Skin= Negative for Rash, lesions, ulcers, itching   Objective: Vitals:   09/16/17 2321 09/17/17 0245 09/17/17 0805 09/17/17 1202  BP: (!) 170/73 (!) 161/96 (!) 138/91 (!) 147/86  Pulse: 60 (!) 58 (!) 54 (!) 53  Resp: 18 (!) 22    Temp: 98.1 F (36.7 C) 97.7 F (36.5 C) 97.9 F (36.6 C) 97.6 F (36.4 C)  TempSrc: Oral Oral Oral Oral  SpO2: 95% 96% 98% 98%  Weight:      Height:        Intake/Output Summary (Last 24 hours) at 09/17/2017 1228 Last data filed at 09/17/2017 0622 Gross per 24 hour  Intake 433.95 ml  Output 400 ml  Net 33.95 ml   Filed Weights   09/14/17 1319 09/14/17 1824  Weight: 93.9 kg (207 lb) 98 kg (216 lb 0.8 oz)    Examination:  General exam: Slightly drowsy this morning as he had just received Ativan because of severe anxiety this morning.  Easily arousable Respiratory system: Clear to auscultation. Respiratory effort normal. Cardiovascular system:  S1 & S2 heard, RRR. No JVD, murmurs, rubs, gallops or clicks. No pedal edema. Gastrointestinal system: Abdomen is nondistended, soft and nontender. No organomegaly or masses felt. Normal bowel sounds heard. Central nervous system: Alert and oriented.  Right upper extremity weakness 3/5, rest of the extremities 4+/5.  Also has slurred speech Extremities: No edema Skin: No rashes, lesions or ulcers Psychiatry: Very anxious every time he wakes up.    Data Reviewed:   CBC: Recent Labs  Lab 09/14/17 0359 09/14/17 0406 09/16/17 0428 09/17/17 0325  WBC 5.8  --  8.2 7.7  NEUTROABS 3.6  --   --   --   HGB 14.6 14.6 14.0 14.2  HCT 43.5 43.0 41.6 41.8  MCV 92.0  --  92.7 92.9  PLT 143*  --  145* 458*   Basic Metabolic Panel: Recent Labs  Lab 09/14/17 0359 09/14/17 0406  NA 132* 131*  K 4.5 4.3  CL 98* 96*  CO2 25  --   GLUCOSE 122* 117*  BUN 18 18  CREATININE 1.03 1.00  CALCIUM 9.3  --    GFR: Estimated Creatinine Clearance: 62.8 mL/min (by C-G formula based on SCr of 1 mg/dL). Liver Function Tests: Recent Labs  Lab 09/14/17 0359  AST 29  ALT 23  ALKPHOS 58  BILITOT 1.0  PROT 6.1*  ALBUMIN 3.5  No results for input(s): LIPASE, AMYLASE in the last 168 hours. No results for input(s): AMMONIA in the last 168 hours. Coagulation Profile: Recent Labs  Lab 09/14/17 0359  INR 1.38   Cardiac Enzymes: No results for input(s): CKTOTAL, CKMB, CKMBINDEX, TROPONINI in the last 168 hours. BNP (last 3 results) No results for input(s): PROBNP in the last 8760 hours. HbA1C: Recent Labs    09/15/17 0627  HGBA1C 5.2   CBG: Recent Labs  Lab 09/14/17 0401  GLUCAP 122*   Lipid Profile: Recent Labs    09/15/17 0631  CHOL 119  HDL 44  LDLCALC 67  TRIG 42  CHOLHDL 2.7   Thyroid Function Tests: No results for input(s): TSH, T4TOTAL, FREET4, T3FREE, THYROIDAB in the last 72 hours. Anemia Panel: No results for input(s): VITAMINB12, FOLATE, FERRITIN, TIBC, IRON,  RETICCTPCT in the last 72 hours. Sepsis Labs: No results for input(s): PROCALCITON, LATICACIDVEN in the last 168 hours.  No results found for this or any previous visit (from the past 240 hour(s)).       Radiology Studies: Ct Head Wo Contrast  Result Date: 09/15/2017 CLINICAL DATA:  84 year old male with several small acute lacunar infarcts diagnosed by MRI yesterday, most notably in the left basal ganglia. EXAM: CT HEAD WITHOUT CONTRAST TECHNIQUE: Contiguous axial images were obtained from the base of the skull through the vertex without intravenous contrast. COMPARISON:  Brain MRI, CTA, CT perfusion, and plain head CT yesterday. FINDINGS: Brain: Subtle hypodensity in the posterior left lentiform or external capsule corresponding to some of the diffusion restriction demonstrated yesterday. Elsewhere stable gray-white matter differentiation throughout the brain. No midline shift, ventriculomegaly, mass effect, evidence of mass lesion, intracranial hemorrhage or new cortically based acute infarction. Vascular: Calcified atherosclerosis at the skull base. Skull: No acute osseous abnormality identified. Sinuses/Orbits: Visualized paranasal sinuses and mastoids are stable and well pneumatized. Other: No acute orbit or scalp soft tissue findings. IMPRESSION: 1. Subtle evidence of the left basal ganglia lacunar infarct seen by MRI yesterday. No associated hemorrhage or mass effect. The other small lacunes remain occult by CT. 2. No new intracranial abnormality. Electronically Signed   By: Genevie Ann M.D.   On: 09/15/2017 14:37   Dg Swallowing Func-speech Pathology  Result Date: 09/16/2017 Objective Swallowing Evaluation: Type of Study: MBS-Modified Barium Swallow Study  Patient Details Name: Walter Bryant MRN: [id number] Date of Birth: [birth year] Today's Date: 09/16/2017 Time: SLP Start Time (ACUTE ONLY): 1030 -SLP Stop Time (ACUTE ONLY): 1100 SLP Time Calculation (min) (ACUTE ONLY): 30 min Past Medical  History: Past Medical History: Diagnosis Date . A-fib ([place])  . AAA (abdominal aortic aneurysm) ([place])  . Abnormality of gait 12/20/2013 . Anxiety and depression  . Anxiety and depression  . Arthritis  . Barrett's esophagus  . BPH (benign prostatic hyperplasia)  . Degenerative arthritis  . GERD (gastroesophageal reflux disease)  . History of concussion  . HOH (hard of hearing)  . Hyperlipidemia  . Hypertension  . Left tibial fracture  . Macular degeneration Left . Polyneuropathy in other diseases classified elsewhere ([place]) 12/20/2013 . Right clavicle fracture  . Spinal stenosis  . Transient global amnesia  . Vertigo  Past Surgical History: Past Surgical History: Procedure Laterality Date . ABDOMINAL AORTIC ANEURYSM REPAIR  2016  stent placement . APPENDECTOMY   . CATARACT EXTRACTION    Bilateral . KNEE SURGERY Bilateral   both  knees replaced . LAPAROSCOPIC RIGHT HEMI COLECTOMY   . LUMBAR LAMINECTOMY    L4  .  REFRACTIVE SURGERY Left   macular degeneration . TONSILLECTOMY AND ADENOIDECTOMY   . TOTAL HIP ARTHROPLASTY Right   x 2 . WRIST FRACTURE SURGERY Right  HPI: Pt is a 84 yo male, retired [employer], now living at [place], with hx of atrial fibrillation on Pradaxa, AAA, anxiety and depression, hypertension, hyperlipidemia, TGA's in the past, severe peripheral neuropathy. Pt admitted for stroke work up with MRI revealing "1. Acute left basal ganglia infarct. 2. Punctate acute infarcts in the right frontal and posterior. 3. Mild-to-moderate chronic small vessel ischemic disease and moderate cerebral atrophy." Daughter reports recent pneumonia. ED visit noted 08/13/17. Most recent CXR 5/29 shows "Worsening left basilar airspace opacity is concerning for recurrent pneumonia." Pt underwent MBS 5/30, with results revealing functional swallow, recs for regular diet, thin liquids.  Pt with subsequent decline in neuro function that afternoon with worsening dysarthria and CN deficits.  Subjective: lethargic Assessment / Plan /  Recommendation CHL IP CLINICAL IMPRESSIONS 09/16/2017 Clinical Impression Pt presents with a moderate oral, mild pharyngeal dysphagia, which is a decline in function since yesterday's study.  Today the oral phase is c/b marked discoordination with diffuse spreading of bolus within oral cavity, prolonged oral preparation, piecemeal bolusing.  Thin liquids, when consumed in large boluses, were noted to be aspirated before the swallow with no associated cough response.  Nectars and purees are tolerated best, particularly given pt's lethargy.  Recommend resuming this modified diet - dysphagia 1, nectar thick liquids; give meds whole with puree.  Discussed results of study and recommendations, risks for aspiration, with pt's daughter, Delsa Sale, after the study.  She verbalizes understanding of precautions and rationale.  SLP will follow for dysphagia treatment.   SLP Visit Diagnosis Dysphagia, oropharyngeal phase (R13.12) Attention and concentration deficit following -- Frontal lobe and executive function deficit following -- Impact on safety and function Mild aspiration risk   CHL IP TREATMENT RECOMMENDATION 09/16/2017 Treatment Recommendations Therapy as outlined in treatment plan below   Prognosis 09/16/2017 Prognosis for Safe Diet Advancement Good Barriers to Reach Goals -- Barriers/Prognosis Comment -- CHL IP DIET RECOMMENDATION 09/16/2017 SLP Diet Recommendations Dysphagia 1 (Puree) solids;Nectar thick liquid Liquid Administration via Cup Medication Administration Whole meds with puree Compensations Minimize environmental distractions;Slow rate;Small sips/bites Postural Changes --   CHL IP OTHER RECOMMENDATIONS 09/16/2017 Recommended Consults -- Oral Care Recommendations Oral care BID Other Recommendations --   CHL IP FOLLOW UP RECOMMENDATIONS 09/16/2017 Follow up Recommendations Other (comment)   CHL IP FREQUENCY AND DURATION 09/16/2017 Speech Therapy Frequency (ACUTE ONLY) min 2x/week Treatment Duration 2 weeks      CHL IP  ORAL PHASE 09/16/2017 Oral Phase Impaired Oral - Pudding Teaspoon -- Oral - Pudding Cup -- Oral - Honey Teaspoon -- Oral - Honey Cup -- Oral - Nectar Teaspoon -- Oral - Nectar Cup Weak lingual manipulation;Reduced posterior propulsion;Lingual/palatal residue;Piecemeal swallowing;Delayed oral transit;Decreased bolus cohesion Oral - Nectar Straw -- Oral - Thin Teaspoon -- Oral - Thin Cup Weak lingual manipulation;Piecemeal swallowing;Delayed oral transit;Decreased bolus cohesion Oral - Thin Straw -- Oral - Puree Weak lingual manipulation;Reduced posterior propulsion;Lingual/palatal residue;Piecemeal swallowing;Delayed oral transit;Decreased bolus cohesion Oral - Mech Soft -- Oral - Regular -- Oral - Multi-Consistency -- Oral - Pill -- Oral Phase - Comment --  CHL IP PHARYNGEAL PHASE 09/16/2017 Pharyngeal Phase Impaired Pharyngeal- Pudding Teaspoon -- Pharyngeal -- Pharyngeal- Pudding Cup -- Pharyngeal -- Pharyngeal- Honey Teaspoon -- Pharyngeal -- Pharyngeal- Honey Cup -- Pharyngeal -- Pharyngeal- Nectar Teaspoon -- Pharyngeal -- Pharyngeal- Nectar Cup Delayed swallow initiation-vallecula;Reduced tongue  base retraction;Penetration/Aspiration before swallow;Pharyngeal residue - valleculae Pharyngeal Material enters airway, remains ABOVE vocal cords then ejected out Pharyngeal- Nectar Straw -- Pharyngeal -- Pharyngeal- Thin Teaspoon -- Pharyngeal -- Pharyngeal- Thin Cup Delayed swallow initiation-pyriform sinuses;Penetration/Aspiration before swallow;Trace aspiration Pharyngeal Material enters airway, passes BELOW cords without attempt by patient to eject out (silent aspiration) Pharyngeal- Thin Straw -- Pharyngeal -- Pharyngeal- Puree Delayed swallow initiation-vallecula;Other (Comment) Pharyngeal -- Pharyngeal- Mechanical Soft -- Pharyngeal -- Pharyngeal- Regular -- Pharyngeal -- Pharyngeal- Multi-consistency -- Pharyngeal -- Pharyngeal- Pill -- Pharyngeal -- Pharyngeal Comment --  No flowsheet data found. No flowsheet  data found. [name] 09/16/2017, 11:51 AM                   Scheduled Meds: .  stroke: mapping our early stages of recovery book   Does not apply Once  . aspirin  300 mg Rectal Daily   Or  . aspirin  325 mg Oral Daily  . calcium-vitamin D  1 tablet Oral Q breakfast  . multivitamin with minerals  1 tablet Oral Daily  . simvastatin  20 mg Oral [id number]   Continuous Infusions: . sodium chloride 75 mL/hr at 09/17/17 0734  . famotidine (PEPCID) IV Stopped (09/17/17 1017)  . heparin 1,250 Units/hr (09/16/17 1440)     LOS: 3 days    I have spent 25 minutes face to face with the patient and on the ward discussing the patients care, assessment, plan and disposition with other care givers. >50% of the time was devoted counseling the patient about the risks and benefits of treatment and coordinating care.     Cristal Deer, MD [facility] Pager   If 7PM-7AM, please contact night-coverage [email or web address] Password [facility] 09/17/2017, 12:28 PM

## 2017-09-17 NOTE — Progress Notes (Signed)
   09/17/17 0800  Clinical Encounter Type  Visited With Family  Visit Type Initial;Spiritual support  Referral From Family;Nurse  Consult/Referral To Chaplain  Spiritual Encounters  Spiritual Needs  (requesting a priest)   Responded to a request from the family on 5/31 to call a Idelle Crouch.  I visited with the family and they stated they attend [facility] in [facility]  I was able to reach the [place] and he will come today.  I let the unit secretary know to pass on to the family. Chaplain Katherene Ponto

## 2017-09-17 NOTE — Progress Notes (Signed)
ANTICOAGULATION CONSULT NOTE - Follow-Up Consult  Pharmacy Consult for heparin Indication: atrial fibrillation with stroke  Patient Measurements: Height: 6\' 2"  (188 cm) Weight: 216 lb 0.8 oz (98 kg) IBW/kg (Calculated) : 82.2  Heparin Dosing Wt: 98 kg  Vital Signs: Temp: 97.9 F (36.6 C) (06/01 0805) Temp Source: Oral (06/01 0805) BP: 138/91 (06/01 0805) Pulse Rate: 54 (06/01 0805)  Labs: Recent Labs    09/16/17 0428 09/16/17 1325 09/16/17 2241 09/17/17 0325 09/17/17 0627  HGB 14.0  --   --  14.2  --   HCT 41.6  --   --  41.8  --   PLT 145*  --   --  130*  --   HEPARINUNFRC 0.26* 0.58 0.43  --  0.50    Estimated Creatinine Clearance: 62.8 mL/min (by C-G formula based on SCr of 1 mg/dL).   Assessment: 84 yo male with atrial fibrillation on Pradaxa prior to admission. Patient sustained embolic stroke despite anticoagulation. Last dose of pradaxa was 09/15/17 at 0821. Pharmacy asked to start heparin drip to treat afib in the setting of stroke.   Heparin level therapeutic x2 on infusion at 1250 units/hr. Hgb stable, plts low. No bleeding noted.  Goal of Therapy:  Heparin levels 0.3-0.5 Monitor platelets by anticoagulation protocol: Yes   Plan:  Continue Heparin at 1250 units/hr Daily heparin level and CBC Follow long term anticoagulation plans and GOC  Thank you for allowing pharmacy to be a part of this patient's care.  Nhan Qualley D. Shilynn Hoch, PharmD, BCPS Clinical Pharmacist [phone] Please check AMION for all [place] numbers 09/17/2017 10:36 AM

## 2017-09-18 DIAGNOSIS — F411 Generalized anxiety disorder: Secondary | ICD-10-CM

## 2017-09-18 LAB — CBC
HEMATOCRIT: 45.4 % (ref 39.0–52.0)
HEMOGLOBIN: 15.4 g/dL (ref 13.0–17.0)
MCH: 31.3 pg (ref 26.0–34.0)
MCHC: 33.9 g/dL (ref 30.0–36.0)
MCV: 92.3 fL (ref 78.0–100.0)
Platelets: 135 10*3/uL — ABNORMAL LOW (ref 150–400)
RBC: 4.92 MIL/uL (ref 4.22–5.81)
RDW: 13.9 % (ref 11.5–15.5)
WBC: 7.4 10*3/uL (ref 4.0–10.5)

## 2017-09-18 LAB — HEPARIN LEVEL (UNFRACTIONATED): HEPARIN UNFRACTIONATED: 1.04 [IU]/mL — AB (ref 0.30–0.70)

## 2017-09-18 MED ORDER — LORAZEPAM 2 MG/ML IJ SOLN
0.5000 mg | INTRAMUSCULAR | Status: DC | PRN
  Administered 2017-09-18 – 2017-09-19 (×2): 0.5 mg via INTRAVENOUS
  Filled 2017-09-18 (×2): qty 1

## 2017-09-18 MED ORDER — TRAZODONE HCL 50 MG PO TABS
50.0000 mg | ORAL_TABLET | Freq: Every day | ORAL | Status: DC
  Administered 2017-09-18: 50 mg via ORAL
  Filled 2017-09-18: qty 1

## 2017-09-18 MED ORDER — BUSPIRONE HCL 10 MG PO TABS
10.0000 mg | ORAL_TABLET | Freq: Two times a day (BID) | ORAL | Status: DC
  Administered 2017-09-18 – 2017-09-19 (×3): 10 mg via ORAL
  Filled 2017-09-18 (×4): qty 1

## 2017-09-18 MED ORDER — FAMOTIDINE 20 MG PO TABS
20.0000 mg | ORAL_TABLET | Freq: Two times a day (BID) | ORAL | Status: DC
  Administered 2017-09-18 – 2017-09-19 (×3): 20 mg via ORAL
  Filled 2017-09-18 (×3): qty 1

## 2017-09-18 NOTE — Progress Notes (Signed)
Daily Progress Note   Patient Name: Walter Bryant       Date: 09/18/2017 DOB: [birth year]  Age: 84 y.o. MRN#: [id number] Attending Physician: Cristal Deer, MD Primary Care Physician: Haywood Pao, MD Admit Date: 09/14/2017  Reason for Consultation/Follow-up: Establishing goals of care and Psychosocial/spiritual support  Subjective: Patient seen, chart reviewed.  Patient continues to be quite anxious requiring multiple PRN's alternating Xanax with Ativan.  He has been started on BuSpar 10 mg twice daily which should be very helpful but will likely take several weeks for full effect.  Family verbalizing multiple concerns regarding feeding and whether he will be admitted to CIR.  Patient himself is asking for Dr. Tessa Lerner.  Patient continues to verbalize no appetite.  He was able to eat ice cream and pudding on 09/17/2017 without difficulty  Length of Stay: 4  Current Medications: Scheduled Meds:  .  stroke: mapping our early stages of recovery book   Does not apply Once  . apixaban  5 mg Oral BID  . aspirin EC  81 mg Oral Daily  . busPIRone  10 mg Oral BID  . calcium-vitamin D  1 tablet Oral Q breakfast  . famotidine  20 mg Oral BID  . feeding supplement  1 Container Oral TID BM  . feeding supplement (ENSURE ENLIVE)  237 mL Oral BID BM  . multivitamin with minerals  1 tablet Oral Daily  . simvastatin  20 mg Oral BH-q7a    Continuous Infusions: . sodium chloride 50 mL/hr at 09/17/17 1815    PRN Meds: acetaminophen **OR** acetaminophen (TYLENOL) oral liquid 160 mg/5 mL **OR** acetaminophen, ALPRAZolam, LORazepam, RESOURCE THICKENUP CLEAR, senna-docusate, traZODone  Physical Exam  Constitutional: He appears well-developed and well-nourished.  HENT:  Head: Normocephalic and  atraumatic.  Neck: Normal range of motion.  Cardiovascular: Normal rate.  Abdominal: Soft.  Musculoskeletal:  Right sided weakness  Neurological:  somnalent  Skin: Skin is warm and dry.  Psychiatric:  Pt has been very anxious  Nursing note and vitals reviewed.           Vital Signs: BP 110/69 (BP Location: Left Arm)   Pulse 67   Temp (!) 97.5 F (36.4 C) (Oral)   Resp 15   Ht 6\' 2"  (1.88 m)   Wt 98 kg (216 lb 0.8  oz)   SpO2 100%   BMI 27.74 kg/m  SpO2: SpO2: 100 % O2 Device: O2 Device: Room Air O2 Flow Rate:    Intake/output summary:   Intake/Output Summary (Last 24 hours) at 09/18/2017 1711 Last data filed at 09/17/2017 2019 Gross per 24 hour  Intake -  Output 926 ml  Net -926 ml   LBM: Last BM Date: 09/13/17 Baseline Weight: Weight: 93.9 kg (207 lb) Most recent weight: Weight: 98 kg (216 lb 0.8 oz)       Palliative Assessment/Data:    Flowsheet Rows     Most Recent Value  Intake Tab  Referral Department  Hospitalist  Unit at Time of Referral  Med/Surg Unit  Palliative Care Primary Diagnosis  Neurology  Date Notified  09/16/17  Reason for referral  Clarify Goals of Care  Date of Admission  10/15/17  Date first seen by Palliative Care  09/17/17  # of days Palliative referral response time  1 Day(s)  # of days IP prior to Palliative referral  -29  Clinical Assessment  Palliative Performance Scale Score  40%  Pain Max last 24 hours  Not able to report  Pain Min Last 24 hours  Not able to report  Dyspnea Max Last 24 Hours  Not able to report  Dyspnea Min Last 24 hours  Not able to report  Nausea Max Last 24 Hours  Not able to report  Nausea Min Last 24 Hours  Not able to report  Anxiety Max Last 24 Hours  Not able to report  Anxiety Min Last 24 Hours  Not able to report  Other Max Last 24 Hours  Not able to report  Psychosocial & Spiritual Assessment  Palliative Care Outcomes  Patient/Family meeting held?  Yes  Who was at the meeting?  pt, wife,  children  Palliative Care follow-up planned  Yes, Facility      Patient Active Problem List   Diagnosis Date Noted  . Goals of care, counseling/discussion   . Hiccoughs   . Palliative care by specialist   . Acute ischemic stroke ([place]) 09/14/2017  . HTN (hypertension) 09/14/2017  . A-fib ([place]) 09/14/2017  . Hyponatremia 08/17/2017  . Fluid overload 08/16/2017  . Anxiety 08/16/2017  . CAP (community acquired pneumonia) 08/15/2017  . Polyneuropathy in other diseases classified elsewhere ([place]) 12/20/2013  . Abnormality of gait 12/20/2013  . Non-healing right groin open wound 04/09/2013    Palliative Care Assessment & Plan   Patient Profile: 84 y.o. male  with past medical history of A. fib, hypertension, hyperlipidemia, AAA, BPH admitted on 09/14/2017 with altered mental status.  Patient was found to have an acute left basal ganglia, right frontal, right posterior CVA; moderate cerebral atrophy.  Consult ordered for goals of care  Recommendations/Plan:  Recommend repeat modified barium swallow to ascertain patient's functional ability to swallow.  After repeat modified barium swallow, revisit whether to place a core track temporarily for increased nutrition.  Family in agreement with this plan  Will discontinue Xanax and rely on Ativan IV 0.5 mg as needed.  Nighttime seems to be worse for patient's anxiety.  His son was at the bedside through last night and states patient was waking up every hour on the hour asking if it was time to either had Xanax or Ativan.    Will also schedule trazodone nightly as opposed to as needed to see if he can rest better and not rely as much on the Ativan in order to be  more alert  Family remains very hopeful for CIR  Recommended to the  family that patient obtain sleep study.  Per family, there is seems to be a high probability that he has had untreated obstructive sleep apnea as evidenced by snoring, fatigue, rapidly falling asleep when watching  the TV or engaging in other activities  Goals of Care and Additional Recommendations:  Limitations on Scope of Treatment: Full Scope Treatment  Code Status:    Code Status Orders  (From admission, onward)        Start     Ordered   09/14/17 0536  Full code  Continuous     09/14/17 0539    Code Status History    Date Active Date Inactive Code Status Order ID Comments User Context   08/15/2017 1642 08/17/2017 2019 Full Code [id number]  Hosie Poisson, MD Inpatient    Advance Directive Documentation     Most Recent Value  Type of Advance Directive  Healthcare Power of Attorney  Pre-existing out of facility DNR order (yellow form or pink MOST form)  -  "MOST" Form in Place?  -       Prognosis:   Unable to determine  Discharge Planning:  Hopefully to CIR   Thank you for allowing the Palliative Medicine Team to assist in the care of this patient.   Time In: 1600 Time Out: 1640 Total Time 40 min Prolonged Time Billed  no       Greater than 50%  of this time was spent counseling and coordinating care related to the above assessment and plan.  Dory Horn, NP  Please contact Palliative Medicine Team phone at [phone] for questions and concerns.

## 2017-09-18 NOTE — Progress Notes (Signed)
PROGRESS NOTE    Vivaan Helseth [facility]  UXL:[id number] DOB: January 07, 1931 DOA: 09/14/2017 PCP: Haywood Pao, MD   Brief Narrative:  84 year old male with a history of atrial fibrillation on Pradaxa, AAA, essential hypertension, TGA came to the hospital with complaints of right arm weakness and numbness.  Stroke team was consulted and patient underwent routine stroke work-up.  MRI of the brain revealed acute left basilar, right frontal, right posterior and occipital lobe infarct.  At this point patient is already on maximum therapy, therefore neurology recommended continuing Pradaxa.  Statin was changed to high intensity.  He was evaluated by speech and was thought to be an mild aspiration risk therefore recommending regular, nectar thick liquid.  Whole meds with pure.  Patient to be fed with supervision.  Physical therapy recommended patient would benefit from inpatient treatment therefore CIR was consulted. During the hospital stay patient's symptoms were very fluctuating in terms of his right upper extremity weakness and slurred speech.  Due to this it was causing a lot of anxiety for the patient therefore he was prescribed low-dose Xanax. Patient's symptoms have been fluctuating well in the hospital.  He was evaluated by physical therapy who recommended CIR.  As patient was being evaluated yesterday by CIR he developed worsening of his speech and weakness therefore he was made n.p.o. again and had CT of the head done which was negative for any brain bleeding.  September 17, 2017:  Patient is experiencing increased anxiety and family is quite concerned asking for something long acting for anxiety control..  I will start him on BuSpar in addition to the as needed Xanax Also reports poor p.o. intake.  I will add Ensure with nectar thick liquid per recommendation of the speech therapist Complains of hiccups but wife would like to hold off starting him on anything since it is not that bad but it is quite  frequent Neurology is following September 18, 2017: Patient is still quite anxious when he is awake he is received Ativan which has helped to calm him down for short..  I had ordered BuSpar but for some reason the order did not come to yesterday so I will reorder the BuSpar. He had difficulty urinating with frequency bladder scan was done and it shows a residual of 473.  I will order a Foley catheter. Assessment & Plan:   Principal Problem:   Acute ischemic stroke ([place]) Active Problems:   HTN (hypertension)   A-fib ([facility])   Goals of care, counseling/discussion   Hiccoughs   Palliative care by specialist   Right upper extremity weakness and dysarthria, fluctuating symptoms Acute CVA-acute left basilar, right frontal, right posterior and occipital lobe infarct - Unfortunately patient is still having fluctuating symptoms along with slurred speech.  Currently he has been made n.p.o. again and will get repeat speech and study evaluation.  Plan is to resume Pradaxa and aspirin once he is able to swallow orally.  In the meantime he is on heparin drip.  Neurology is following, appreciate input.  Continue high intensity steroids - Echocardiogram shows ejection fraction 65 to 70%, moderate dilated cardiomyopathy but no source of emboli identified. -Hemoglobin A1c was noted to be 5.2, lipid panel showed LDL of 67.  Given his fluctuating symptoms and the severity of it I spoke with the patient's family about speaking with palliative care service to establish goals of care especially if he does not do well with speech and swallow.  Consult has been placed.  Family is appreciative  of this.  More family is coming in town today therefore it will help to determine this.  Persistent atrial fibrillation -Currently made n.p.o. again therefore on heparin drip.  Eventually will resume Pradaxa.  Needs to see outpatient cardiologist at the time of discharge.  Essential hypertension -Currently on hold, will resume at  the time of discharge  -Anxiety    I will start him on BuSpar in addition to the as needed Xanax -Urinary retention will start him on a Foley catheter DVT prophylaxis: On heparin drip Code Status: Full code Family Communication: Spoke with daughter at bedside Disposition Plan: Maintain inpatient stay  Consultants:   Neurology  PMNR  Procedures:   None  Antimicrobials:   None   Subjective: Patient has frequent episodes of anxiety/panic attacks given his fluctuation of symptoms of weakness and slurred speech which makes him more anxious. family is quite concerned asking for something long acting for anxiety control..  I will start him on BuSpar in addition to the as needed Xanax   ROS: General = no fevers, chills, dizziness, malaise, fatigue HEENT/EYES = negative for pain, redness, loss of vision, double vision, blurred vision, loss of hearing, sore throat, hoarseness, dysphagia Cardiovascular= negative for chest pain, palpitation, murmurs, lower extremity swelling Respiratory/lungs= negative for shortness of breath, cough, hemoptysis, wheezing, mucus production Gastrointestinal= negative for nausea, vomiting,, abdominal pain, melena, hematemesis Genitourinary= negative for Dysuria, Hematuria, Change in Urinary Frequency MSK = Negative for arthralgia, myalgias, Back Pain, Joint swelling  Neurology= slurred speech, weakness in the right upper extremity Psychiatry= Negative for anxiety, depression, suicidal and homocidal ideation Allergy/Immunology= Medication/Food allergy as listed  Skin= Negative for Rash, lesions, ulcers, itching   Objective: Vitals:   09/17/17 1944 09/17/17 2359 09/18/17 0815 09/18/17 1116  BP: (!) 178/92 (!) 169/85 (!) 176/82 110/69  Pulse: (!) 59 (!) 59 (!) 56 67  Resp: 18 18 19 15   Temp: (!) 97.5 F (36.4 C) (!) 97.5 F (36.4 C) (!) 97.5 F (36.4 C)   TempSrc: Oral Axillary Oral   SpO2: 99% 98% 98% 100%  Weight:      Height:         Intake/Output Summary (Last 24 hours) at 09/18/2017 1538 Last data filed at 09/17/2017 2019 Gross per 24 hour  Intake -  Output 926 ml  Net -926 ml   Filed Weights   09/14/17 1319 09/14/17 1824  Weight: 93.9 kg (207 lb) 98 kg (216 lb 0.8 oz)    Examination:  General exam: Slightly drowsy this morning as he had just received Ativan because of severe anxiety this morning.  Easily arousable Respiratory system: Clear to auscultation. Respiratory effort normal. Cardiovascular system: S1 & S2 heard, RRR. No JVD, murmurs, rubs, gallops or clicks. No pedal edema. Gastrointestinal system: Abdomen is nondistended, soft and nontender. No organomegaly or masses felt. Normal bowel sounds heard. Central nervous system: Alert and oriented.  Right upper extremity weakness 3/5, rest of the extremities 4+/5.  Also has slurred speech Extremities: No edema Skin: No rashes, lesions or ulcers Psychiatry: Very anxious every time he wakes up.    Data Reviewed:   CBC: Recent Labs  Lab 09/14/17 0359 09/14/17 0406 09/16/17 0428 09/17/17 0325 09/18/17 0728  WBC 5.8  --  8.2 7.7 7.4  NEUTROABS 3.6  --   --   --   --   HGB 14.6 14.6 14.0 14.2 15.4  HCT 43.5 43.0 41.6 41.8 45.4  MCV 92.0  --  92.7 92.9 92.3  PLT  143*  --  145* 130* 937*   Basic Metabolic Panel: Recent Labs  Lab 09/14/17 0359 09/14/17 0406  NA 132* 131*  K 4.5 4.3  CL 98* 96*  CO2 25  --   GLUCOSE 122* 117*  BUN 18 18  CREATININE 1.03 1.00  CALCIUM 9.3  --    GFR: Estimated Creatinine Clearance: 62.8 mL/min (by C-G formula based on SCr of 1 mg/dL). Liver Function Tests: Recent Labs  Lab 09/14/17 0359  AST 29  ALT 23  ALKPHOS 58  BILITOT 1.0  PROT 6.1*  ALBUMIN 3.5   No results for input(s): LIPASE, AMYLASE in the last 168 hours. No results for input(s): AMMONIA in the last 168 hours. Coagulation Profile: Recent Labs  Lab 09/14/17 0359  INR 1.38   Cardiac Enzymes: No results for input(s): CKTOTAL, CKMB,  CKMBINDEX, TROPONINI in the last 168 hours. BNP (last 3 results) No results for input(s): PROBNP in the last 8760 hours. HbA1C: No results for input(s): HGBA1C in the last 72 hours. CBG: Recent Labs  Lab 09/14/17 0401  GLUCAP 122*   Lipid Profile: No results for input(s): CHOL, HDL, LDLCALC, TRIG, CHOLHDL, LDLDIRECT in the last 72 hours. Thyroid Function Tests: No results for input(s): TSH, T4TOTAL, FREET4, T3FREE, THYROIDAB in the last 72 hours. Anemia Panel: No results for input(s): VITAMINB12, FOLATE, FERRITIN, TIBC, IRON, RETICCTPCT in the last 72 hours. Sepsis Labs: No results for input(s): PROCALCITON, LATICACIDVEN in the last 168 hours.  No results found for this or any previous visit (from the past 240 hour(s)).       Radiology Studies: No results found.      Scheduled Meds: .  stroke: mapping our early stages of recovery book   Does not apply Once  . apixaban  5 mg Oral BID  . aspirin EC  81 mg Oral Daily  . busPIRone  10 mg Oral BID  . calcium-vitamin D  1 tablet Oral Q breakfast  . famotidine  20 mg Oral BID  . feeding supplement  1 Container Oral TID BM  . feeding supplement (ENSURE ENLIVE)  237 mL Oral BID BM  . multivitamin with minerals  1 tablet Oral Daily  . simvastatin  20 mg Oral BH-q7a   Continuous Infusions: . sodium chloride 50 mL/hr at 09/17/17 1815     LOS: 4 days    I have spent 25 minutes face to face with the patient and on the ward discussing the patients care, assessment, plan and disposition with other care givers. >50% of the time was devoted counseling the patient about the risks and benefits of treatment and coordinating care.     Cristal Deer, MD [facility] Pager   If 7PM-7AM, please contact night-coverage [email or web address] Password [facility] 09/18/2017, 3:38 PM

## 2017-09-18 NOTE — Plan of Care (Signed)
Progressing daily

## 2017-09-19 ENCOUNTER — Inpatient Hospital Stay (HOSPITAL_COMMUNITY)
Admission: RE | Admit: 2017-09-19 | Discharge: 2017-10-11 | DRG: 057 | Disposition: A | Discharge status: Skilled Nursing Facility | Payer: Medicare Other | Source: Intra-hospital | Attending: Physical Medicine & Rehabilitation | Admitting: Physical Medicine & Rehabilitation

## 2017-09-19 ENCOUNTER — Encounter (HOSPITAL_COMMUNITY): Payer: Self-pay | Admitting: *Deleted

## 2017-09-19 ENCOUNTER — Other Ambulatory Visit: Payer: Self-pay

## 2017-09-19 ENCOUNTER — Inpatient Hospital Stay (HOSPITAL_COMMUNITY): Payer: Medicare Other

## 2017-09-19 DIAGNOSIS — Z96641 Presence of right artificial hip joint: Secondary | ICD-10-CM | POA: Diagnosis present

## 2017-09-19 DIAGNOSIS — K2270 Barrett's esophagus without dysplasia: Secondary | ICD-10-CM | POA: Diagnosis not present

## 2017-09-19 DIAGNOSIS — I69351 Hemiplegia and hemiparesis following cerebral infarction affecting right dominant side: Secondary | ICD-10-CM | POA: Diagnosis not present

## 2017-09-19 DIAGNOSIS — I69322 Dysarthria following cerebral infarction: Secondary | ICD-10-CM | POA: Diagnosis not present

## 2017-09-19 DIAGNOSIS — M159 Polyosteoarthritis, unspecified: Secondary | ICD-10-CM | POA: Diagnosis not present

## 2017-09-19 DIAGNOSIS — Z8249 Family history of ischemic heart disease and other diseases of the circulatory system: Secondary | ICD-10-CM

## 2017-09-19 DIAGNOSIS — N390 Urinary tract infection, site not specified: Secondary | ICD-10-CM | POA: Diagnosis not present

## 2017-09-19 DIAGNOSIS — M1990 Unspecified osteoarthritis, unspecified site: Secondary | ICD-10-CM | POA: Diagnosis present

## 2017-09-19 DIAGNOSIS — I4891 Unspecified atrial fibrillation: Secondary | ICD-10-CM | POA: Diagnosis present

## 2017-09-19 DIAGNOSIS — Z515 Encounter for palliative care: Secondary | ICD-10-CM | POA: Diagnosis not present

## 2017-09-19 DIAGNOSIS — Z9842 Cataract extraction status, left eye: Secondary | ICD-10-CM | POA: Diagnosis not present

## 2017-09-19 DIAGNOSIS — I10 Essential (primary) hypertension: Secondary | ICD-10-CM | POA: Diagnosis present

## 2017-09-19 DIAGNOSIS — B952 Enterococcus as the cause of diseases classified elsewhere: Secondary | ICD-10-CM | POA: Diagnosis not present

## 2017-09-19 DIAGNOSIS — R1310 Dysphagia, unspecified: Secondary | ICD-10-CM

## 2017-09-19 DIAGNOSIS — R1312 Dysphagia, oropharyngeal phase: Secondary | ICD-10-CM | POA: Diagnosis present

## 2017-09-19 DIAGNOSIS — Z87891 Personal history of nicotine dependence: Secondary | ICD-10-CM

## 2017-09-19 DIAGNOSIS — Z9841 Cataract extraction status, right eye: Secondary | ICD-10-CM | POA: Diagnosis not present

## 2017-09-19 DIAGNOSIS — R42 Dizziness and giddiness: Secondary | ICD-10-CM | POA: Diagnosis not present

## 2017-09-19 DIAGNOSIS — H832X9 Labyrinthine dysfunction, unspecified ear: Secondary | ICD-10-CM | POA: Diagnosis not present

## 2017-09-19 DIAGNOSIS — R339 Retention of urine, unspecified: Secondary | ICD-10-CM | POA: Diagnosis not present

## 2017-09-19 DIAGNOSIS — M4800 Spinal stenosis, site unspecified: Secondary | ICD-10-CM | POA: Diagnosis present

## 2017-09-19 DIAGNOSIS — K4090 Unilateral inguinal hernia, without obstruction or gangrene, not specified as recurrent: Secondary | ICD-10-CM | POA: Diagnosis not present

## 2017-09-19 DIAGNOSIS — G609 Hereditary and idiopathic neuropathy, unspecified: Secondary | ICD-10-CM | POA: Diagnosis not present

## 2017-09-19 DIAGNOSIS — F329 Major depressive disorder, single episode, unspecified: Secondary | ICD-10-CM | POA: Diagnosis present

## 2017-09-19 DIAGNOSIS — M2550 Pain in unspecified joint: Secondary | ICD-10-CM | POA: Diagnosis not present

## 2017-09-19 DIAGNOSIS — I714 Abdominal aortic aneurysm, without rupture: Secondary | ICD-10-CM | POA: Diagnosis not present

## 2017-09-19 DIAGNOSIS — Z7951 Long term (current) use of inhaled steroids: Secondary | ICD-10-CM

## 2017-09-19 DIAGNOSIS — I69391 Dysphagia following cerebral infarction: Secondary | ICD-10-CM

## 2017-09-19 DIAGNOSIS — Z885 Allergy status to narcotic agent status: Secondary | ICD-10-CM | POA: Diagnosis not present

## 2017-09-19 DIAGNOSIS — K5900 Constipation, unspecified: Secondary | ICD-10-CM | POA: Diagnosis present

## 2017-09-19 DIAGNOSIS — G629 Polyneuropathy, unspecified: Secondary | ICD-10-CM | POA: Diagnosis present

## 2017-09-19 DIAGNOSIS — G4700 Insomnia, unspecified: Secondary | ICD-10-CM | POA: Diagnosis present

## 2017-09-19 DIAGNOSIS — I480 Paroxysmal atrial fibrillation: Secondary | ICD-10-CM

## 2017-09-19 DIAGNOSIS — I639 Cerebral infarction, unspecified: Secondary | ICD-10-CM | POA: Diagnosis not present

## 2017-09-19 DIAGNOSIS — E785 Hyperlipidemia, unspecified: Secondary | ICD-10-CM | POA: Diagnosis present

## 2017-09-19 DIAGNOSIS — N401 Enlarged prostate with lower urinary tract symptoms: Secondary | ICD-10-CM | POA: Diagnosis present

## 2017-09-19 DIAGNOSIS — H3530 Unspecified macular degeneration: Secondary | ICD-10-CM | POA: Diagnosis present

## 2017-09-19 DIAGNOSIS — N400 Enlarged prostate without lower urinary tract symptoms: Secondary | ICD-10-CM | POA: Diagnosis not present

## 2017-09-19 DIAGNOSIS — K219 Gastro-esophageal reflux disease without esophagitis: Secondary | ICD-10-CM | POA: Diagnosis present

## 2017-09-19 DIAGNOSIS — Z7189 Other specified counseling: Secondary | ICD-10-CM | POA: Diagnosis not present

## 2017-09-19 DIAGNOSIS — Z7901 Long term (current) use of anticoagulants: Secondary | ICD-10-CM

## 2017-09-19 DIAGNOSIS — R338 Other retention of urine: Secondary | ICD-10-CM | POA: Diagnosis present

## 2017-09-19 DIAGNOSIS — R066 Hiccough: Secondary | ICD-10-CM | POA: Diagnosis not present

## 2017-09-19 DIAGNOSIS — I482 Chronic atrial fibrillation: Secondary | ICD-10-CM | POA: Diagnosis not present

## 2017-09-19 DIAGNOSIS — R471 Dysarthria and anarthria: Secondary | ICD-10-CM | POA: Diagnosis present

## 2017-09-19 DIAGNOSIS — Z7401 Bed confinement status: Secondary | ICD-10-CM | POA: Diagnosis not present

## 2017-09-19 DIAGNOSIS — H9190 Unspecified hearing loss, unspecified ear: Secondary | ICD-10-CM | POA: Diagnosis present

## 2017-09-19 DIAGNOSIS — I6381 Other cerebral infarction due to occlusion or stenosis of small artery: Secondary | ICD-10-CM | POA: Diagnosis present

## 2017-09-19 DIAGNOSIS — F419 Anxiety disorder, unspecified: Secondary | ICD-10-CM | POA: Diagnosis present

## 2017-09-19 DIAGNOSIS — Z7982 Long term (current) use of aspirin: Secondary | ICD-10-CM | POA: Diagnosis not present

## 2017-09-19 DIAGNOSIS — Z9849 Cataract extraction status, unspecified eye: Secondary | ICD-10-CM | POA: Diagnosis not present

## 2017-09-19 DIAGNOSIS — R7309 Other abnormal glucose: Secondary | ICD-10-CM | POA: Diagnosis not present

## 2017-09-19 DIAGNOSIS — F5101 Primary insomnia: Secondary | ICD-10-CM | POA: Diagnosis not present

## 2017-09-19 LAB — CBC
HCT: 43.5 % (ref 39.0–52.0)
Hemoglobin: 14.6 g/dL (ref 13.0–17.0)
MCH: 31.3 pg (ref 26.0–34.0)
MCHC: 33.6 g/dL (ref 30.0–36.0)
MCV: 93.1 fL (ref 78.0–100.0)
Platelets: 131 10*3/uL — ABNORMAL LOW (ref 150–400)
RBC: 4.67 MIL/uL (ref 4.22–5.81)
RDW: 14 % (ref 11.5–15.5)
WBC: 7.3 10*3/uL (ref 4.0–10.5)

## 2017-09-19 LAB — BASIC METABOLIC PANEL
ANION GAP: 9 (ref 5–15)
BUN: 10 mg/dL (ref 6–20)
CHLORIDE: 106 mmol/L (ref 101–111)
CO2: 24 mmol/L (ref 22–32)
CREATININE: 0.92 mg/dL (ref 0.61–1.24)
Calcium: 9.1 mg/dL (ref 8.9–10.3)
GFR calc non Af Amer: >60 mL/min (ref >60)
Glucose, Bld: 92 mg/dL (ref 65–99)
POTASSIUM: 3.8 mmol/L (ref 3.5–5.1)
SODIUM: 139 mmol/L (ref 135–145)

## 2017-09-19 LAB — URINALYSIS, ROUTINE W REFLEX MICROSCOPIC
Bacteria, UA: NONE SEEN
Mucus: NONE SEEN
RBC / HPF: >50 RBC/hpf — ABNORMAL HIGH (ref 0–5)

## 2017-09-19 MED ORDER — CHLORHEXIDINE GLUCONATE 0.12 % MT SOLN
15.0000 mL | Freq: Two times a day (BID) | OROMUCOSAL | Status: DC
  Administered 2017-09-19 – 2017-10-11 (×31): 15 mL via OROMUCOSAL
  Filled 2017-09-19 (×42): qty 15

## 2017-09-19 MED ORDER — APIXABAN 5 MG PO TABS: 5.0000 mg | ORAL_TABLET | Freq: Two times a day (BID) | ORAL | Status: AC

## 2017-09-19 MED ORDER — BUSPIRONE HCL 5 MG PO TABS
10.0000 mg | ORAL_TABLET | Freq: Two times a day (BID) | ORAL | Status: DC
  Administered 2017-09-19 – 2017-09-30 (×23): 10 mg via ORAL
  Filled 2017-09-19 (×21): qty 2

## 2017-09-19 MED ORDER — FAMOTIDINE 20 MG PO TABS
20.0000 mg | ORAL_TABLET | Freq: Two times a day (BID) | ORAL | Status: DC
  Administered 2017-09-19 – 2017-09-21 (×4): 20 mg via ORAL
  Filled 2017-09-19 (×4): qty 1

## 2017-09-19 MED ORDER — SIMVASTATIN 20 MG PO TABS
20.0000 mg | ORAL_TABLET | Freq: Every day | ORAL | Status: DC
  Administered 2017-09-20 – 2017-10-11 (×22): 20 mg via ORAL
  Filled 2017-09-19 (×22): qty 1

## 2017-09-19 MED ORDER — BOOST / RESOURCE BREEZE PO LIQD CUSTOM
1.0000 | Freq: Three times a day (TID) | ORAL | Status: DC
  Administered 2017-09-20: 1 via ORAL

## 2017-09-19 MED ORDER — ENSURE ENLIVE PO LIQD: 237.0000 mL | Freq: Two times a day (BID) | ORAL | Status: DC

## 2017-09-19 MED ORDER — RESOURCE THICKENUP CLEAR PO POWD
ORAL | Status: DC | PRN
  Filled 2017-09-19: qty 125

## 2017-09-19 MED ORDER — ASPIRIN EC 81 MG PO TBEC
81.0000 mg | DELAYED_RELEASE_TABLET | Freq: Every day | ORAL | Status: DC
  Administered 2017-09-20 – 2017-10-11 (×22): 81 mg via ORAL
  Filled 2017-09-19 (×22): qty 1

## 2017-09-19 MED ORDER — CALCIUM CARBONATE-VITAMIN D 500-200 MG-UNIT PO TABS
1.0000 | ORAL_TABLET | Freq: Every day | ORAL | Status: DC
  Administered 2017-09-20 – 2017-10-11 (×22): 1 via ORAL
  Filled 2017-09-19 (×22): qty 1

## 2017-09-19 MED ORDER — ACETAMINOPHEN 325 MG PO TABS
650.0000 mg | ORAL_TABLET | ORAL | Status: DC | PRN
  Administered 2017-09-24 – 2017-10-03 (×6): 650 mg via ORAL
  Filled 2017-09-19 (×6): qty 2

## 2017-09-19 MED ORDER — JEVITY 1.2 CAL PO LIQD
1000.0000 mL | ORAL | Status: DC
  Filled 2017-09-19 (×3): qty 1000

## 2017-09-19 MED ORDER — PRO-STAT SUGAR FREE PO LIQD: 30.0000 mL | Freq: Every day | ORAL | Status: DC

## 2017-09-19 MED ORDER — SENNOSIDES-DOCUSATE SODIUM 8.6-50 MG PO TABS
1.0000 | ORAL_TABLET | Freq: Every evening | ORAL | Status: DC | PRN
  Administered 2017-09-22: 1 via ORAL
  Filled 2017-09-19: qty 1

## 2017-09-19 MED ORDER — BUSPIRONE HCL 10 MG PO TABS: 10.0000 mg | ORAL_TABLET | Freq: Two times a day (BID) | ORAL | Status: DC

## 2017-09-19 MED ORDER — TRAZODONE HCL 50 MG PO TABS
50.0000 mg | ORAL_TABLET | Freq: Every day | ORAL | Status: DC
  Administered 2017-09-19 – 2017-09-21 (×3): 50 mg via ORAL
  Filled 2017-09-19 (×3): qty 1

## 2017-09-19 MED ORDER — ACETAMINOPHEN 650 MG RE SUPP: 650.0000 mg | RECTAL | Status: DC | PRN

## 2017-09-19 MED ORDER — ORAL CARE MOUTH RINSE
15.0000 mL | Freq: Two times a day (BID) | OROMUCOSAL | Status: DC
  Administered 2017-09-20 – 2017-10-07 (×15): 15 mL via OROMUCOSAL

## 2017-09-19 MED ORDER — TRAZODONE HCL 50 MG PO TABS: 50.0000 mg | ORAL_TABLET | Freq: Every day | ORAL | Status: DC

## 2017-09-19 MED ORDER — APIXABAN 5 MG PO TABS
5.0000 mg | ORAL_TABLET | Freq: Two times a day (BID) | ORAL | Status: DC
  Administered 2017-09-19 – 2017-10-11 (×44): 5 mg via ORAL
  Filled 2017-09-19 (×44): qty 1

## 2017-09-19 MED ORDER — ADULT MULTIVITAMIN W/MINERALS CH
1.0000 | ORAL_TABLET | Freq: Every day | ORAL | Status: DC
  Administered 2017-09-20 – 2017-10-11 (×22): 1 via ORAL
  Filled 2017-09-19 (×22): qty 1

## 2017-09-19 MED ORDER — JEVITY 1.2 CAL PO LIQD
ORAL | Status: AC
  Filled 2017-09-19: qty 237

## 2017-09-19 MED ORDER — ACETAMINOPHEN 160 MG/5ML PO SOLN
650.0000 mg | ORAL | Status: DC | PRN
  Administered 2017-09-24 – 2017-09-27 (×3): 650 mg
  Filled 2017-09-19 (×4): qty 20.3

## 2017-09-19 NOTE — Discharge Summary (Signed)
Physician Discharge Summary  [facility] DJM:[id number] DOB: Aug 25, 1930 DOA: 09/14/2017  PCP: Haywood Pao, MD  Admit date: 09/14/2017 Discharge date: 09/19/2017  Admitted From: home Disposition:  CIR  Recommendations for Outpatient Follow-up:  1. Follow up with PCP in 1-2 weeks  Home Health: none Equipment/Devices: none  Discharge Condition: stable CODE STATUS: Full code Diet recommendation: per SLP, now on tube feeds  HPI: Per Dr. Alcario Drought, Walter Bryant is a 84 y.o. male with medical history significant of A.Fib on pradaxa (last dose yesterday evening before bed), AAA, HTN, TGA's in past.  Patient went to bed LKW 10pm last evening.  Woke up this AM around 3am to use bathroom and realized he had R arm weakness.  EMS called.  Initially had some R grip weakness.  Progressed over next 15-20 min to involve R face, arm, leg, and developed very slurred speech.  Still able to follow all commands. SBP 150s, HR 50s. No seizure like activity. ED Course: CTA shows no emergent large vessel occlusion.  Perfusion study has poor contrast timing.  No bleed on CT head.  Not given TPA due to 1) being out of window with LKW at 10pm and 2) he is on pradaxa anyhow and he and wife confirms him taking it last evening.  Hospital Course: Right upper extremity weakness and dysarthria, fluctuating symptoms Acute CVA-acute left basilar, right frontal, right posterior and occipital lobe infarct - as above, patient was admitted to the hospital with acute CVA.  Neurology was consulted and followed patient while hospitalized.  He underwent a 2D echo which showed ejection fraction of 65 to 70%, moderate dilated cardiomyopathy but no source of emboli identified.  Hemoglobin A1c was 5.2, lipid panel showed an LDL of 67.  He is already on statin.  Due to fluctuating symptoms, dysarthria, persistent right-sided weakness and concerning short-term outcome, palliative care was consulted as well and followed patient.   Per neurology, his Pradaxa was discontinued and patient was started on Eliquis.  He is also to continue aspirin 81 mg.  He will be discharged to inpatient rehab. Dysphagia -following the stroke, patient with persistent dysphagia, he was seen by speech therapy and he is able to do well with a dysphagia diet, however is not clear that he will be able to meet his nutritional needs.  Dietary was also consulted.  After discussion with dietitian, family, decision was made to place a core track tube as a temporary solution until he gets more rehab and see if later on this can be discontinued. Persistent atrial fibrillation -rate controlled with metoprolol, anticoagulation with Eliquis Essential hypertension -continue home medications Anxiety -she was started on BuSpar and trazodone, appreciate palliative's help.  Continue.   Discharge Diagnoses:  Principal Problem:   Acute ischemic stroke [facility]) Active Problems:   Anxiety state   HTN (hypertension)   A-fib (HCC)   Goals of care, counseling/discussion   Hiccoughs   Palliative care by specialist   Dysphagia, post-stroke   PAF (paroxysmal atrial fibrillation) [facility])   Hyperlipidemia     Discharge Instructions  Discharge Instructions    Ambulatory referral to Neurology   Complete by:  As directed    Follow up with stroke clinic NP (Jessica Vanschaick or Cecille Rubin, if both not available, consider Zachery Dauer, or Ahern) at [facility] in about 4 weeks. Thanks.     Allergies as of 09/19/2017      Reactions   Other    vicroyl sutures   Morphine  Other (See Comments)   Edgy    Oxycodone Other (See Comments)   Edgy       Medication List    STOP taking these medications   PRADAXA 150 MG Caps capsule Generic drug:  dabigatran   simvastatin 20 MG tablet Commonly known as:  ZOCOR     TAKE these medications   ALPRAZolam 0.25 MG tablet Commonly known as:  XANAX Take 1 tablet (0.25 mg total) by mouth daily as needed for anxiety.     apixaban 5 MG Tabs tablet Commonly known as:  ELIQUIS Take 1 tablet (5 mg total) by mouth 2 (two) times daily.   aspirin 81 MG tablet Take 81 mg by mouth at bedtime.   atorvastatin 80 MG tablet Commonly known as:  LIPITOR Take 1 tablet (80 mg total) by mouth daily.   busPIRone 10 MG tablet Commonly known as:  BUSPAR Take 1 tablet (10 mg total) by mouth 2 (two) times daily.   CALCIUM 500 PO Take 1 tablet by mouth daily.   fexofenadine 180 MG tablet Commonly known as:  ALLEGRA Take 180 mg by mouth every morning.   fluticasone 50 MCG/ACT nasal spray Commonly known as:  FLONASE Place 2 sprays into both nostrils daily for 7 days. What changed:    when to take this  reasons to take this   furosemide 20 MG tablet Commonly known as:  LASIX Take 1 tablet (20 mg total) by mouth daily.   ibuprofen 200 MG tablet Commonly known as:  ADVIL,MOTRIN Take 200-600 mg by mouth every 6 (six) hours as needed for moderate pain.   lisinopril 5 MG tablet Commonly known as:  PRINIVIL,ZESTRIL Take 5 mg by mouth daily after lunch.   LUTEIN PO Take 25 mg by mouth daily after lunch.   metoprolol succinate 50 MG 24 hr tablet Commonly known as:  TOPROL-XL Take 50 mg by mouth 2 (two) times daily. Take with or immediately following a meal.   multivitamin tablet Take 1 tablet by mouth daily.   OMEGA 3 PO Take 1 tablet by mouth daily after lunch.   omeprazole 20 MG capsule Commonly known as:  PRILOSEC Take 20 mg by mouth every morning.   POTASSIUM PO Take 1 tablet by mouth daily after lunch.   pregabalin 75 MG capsule Commonly known as:  LYRICA Take 75 mg by mouth 4 (four) times daily as needed (pain).   ranitidine 150 MG tablet Commonly known as:  ZANTAC Take 150 mg by mouth 2 (two) times daily. Take in the afternoon and bedtime   Selenium 200 MCG Tabs Take 1 tablet by mouth daily after lunch.   sodium chloride 0.65 % Soln nasal spray Commonly known as:  OCEAN Place 1 spray  into both nostrils every morning.   tamsulosin 0.4 MG Caps capsule Commonly known as:  FLOMAX Take 0.4 mg by mouth at bedtime.   traZODone 50 MG tablet Commonly known as:  DESYREL Take 1 tablet (50 mg total) by mouth at bedtime.   vitamin C 1000 MG tablet Take 1,000 mg by mouth daily.   zinc gluconate 50 MG tablet Take 50 mg by mouth every morning.      Follow-up Information    Tisovec, Fransico Him, MD. Schedule an appointment as soon as possible for a visit in 2 week(s).   Specialty:  Internal Medicine Contact information: [address] [place] [address] [facility]. Schedule an appointment  as soon as possible for a visit in 4 week(s).   Specialty:  Radiology Contact information: [address] [place] [phone]          Consultations:  Neurology   Palliative   Procedures/Studies:  2D echo  ECHO  08/17/17  - Left ventricle: The cavity size was normal. Systolic function was vigorous. The estimated ejection fraction was in the range of 65% to 70%. Wall motion was normal; there were no regional wall motion abnormalities. - Aortic valve: There was mild regurgitation. - Mitral valve: Mildly calcified annulus. There was moderate regurgitation. - Left atrium: The atrium was moderately dilated. - Right atrium: The atrium was mildly to moderately dilated. - Tricuspid valve: There was mild-moderate regurgitation. - Pulmonary arteries: Systolic pressure was moderately increased. PA peak pressure: 57 mm Hg (S). Impressions: - No cardiac source of emboli was indentified.  Ct Angio Head W Or Wo Contrast  Result Date: 09/14/2017 CLINICAL DATA:  84 y/o M; right-sided weakness, facial droop, slurred speech. EXAM: CT ANGIOGRAPHY HEAD AND NECK CT PERFUSION BRAIN TECHNIQUE: Multidetector CT imaging of the head and neck was performed using the standard protocol during  bolus administration of intravenous contrast. Multiplanar CT image reconstructions and MIPs were obtained to evaluate the vascular anatomy. Carotid stenosis measurements (when applicable) are obtained utilizing NASCET criteria, using the distal internal carotid diameter as the denominator. Multiphase CT imaging of the brain was performed following IV bolus contrast injection. Subsequent parametric perfusion maps were calculated using RAPID software. CONTRAST:  189mL ISOVUE-370 IOPAMIDOL (ISOVUE-370) INJECTION 76% COMPARISON:  09/08/2017 CT head. FINDINGS: CTA NECK FINDINGS Aortic arch: Standard branching. Imaged portion shows no evidence of aneurysm or dissection. No significant stenosis of the major arch vessel origins. Moderate calcific atherosclerosis of aortic arch. Right carotid system: No evidence of dissection, stenosis (50% or greater) or occlusion. Non stenotic calcific atherosclerosis of the carotid bifurcation. Left carotid system: No evidence of dissection, stenosis (50% or greater) or occlusion. Non stenotic calcific atherosclerosis of the carotid bifurcation. Vertebral arteries: Left dominant. No evidence of dissection, stenosis (50% or greater) or occlusion. Skeleton: Moderate cervical spondylosis with multilevel disc and facet degenerative changes. No high-grade bony canal stenosis. Other neck: Left submandibular gland atrophy. Upper chest: Negative. 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 Calcific atherosclerosis of the carotid siphons with mild bilateral paraclinoid stenosis. Posterior circulation: No significant stenosis, proximal occlusion, aneurysm, or vascular malformation. Calcific atherosclerosis of vertebral arteries of mild right V4 stenosis. Venous sinuses: As permitted by contrast timing, patent. Anatomic variants: Patent anterior  communicating artery. No posterior communicating artery identified, likely hypoplastic or absent. Review of the MIP images confirms the above findings CT Brain Perfusion Findings: CBF (<30%) Volume: 56mL Perfusion (Tmax>6.0s) volume: 68mL Mismatch Volume: NA. Infarction Location:The AIF and VOF curves had a slow down slope and did not reach equilibrium within scan time. Quantification may be mildly inaccurate, however, there is no asymmetric perfusion abnormality to indicate infarction. IMPRESSION: 1. Patent carotid and vertebral arteries. No dissection, aneurysm, or hemodynamically significant stenosis utilizing NASCET criteria. 2. Patent anterior and posterior intracranial circulation. No large vessel occlusion or high-grade stenosis. 3. 5 mm anterior communicating artery aneurysm. 4. No evidence for focal acute stroke on perfusion imaging. These results were called by telephone at the time of interpretation on 09/14/2017 at 5:00 am  to Dr. Amie Portland , who verbally acknowledged these results. Electronically Signed   By: Kristine Garbe M.D.   On: 09/14/2017 05:10   Dg Chest 2 View  Result Date: 09/14/2017 CLINICAL DATA:  Status post CVA. Recent pulmonary infiltrates. EXAM: CHEST - 2 VIEW COMPARISON:  Chest radiograph performed 08/15/2017 FINDINGS: Worsening left basilar airspace opacity is concerning for recurrent pneumonia. Mild vascular congestion is noted. Mild peribronchial thickening is seen. No significant pleural effusion or pneumothorax is identified. The cardiomediastinal silhouette is borderline normal in size. No acute osseous abnormalities are identified. Mild degenerative change is noted at the left glenohumeral joint. IMPRESSION: Worsening left basilar airspace opacity is concerning for recurrent pneumonia. Mild vascular congestion noted. Electronically Signed   By: Garald Balding M.D.   On: 09/14/2017 06:31   Ct Head Wo Contrast  Result Date: 09/15/2017 CLINICAL DATA:  84 year old  male with several small acute lacunar infarcts diagnosed by MRI yesterday, most notably in the left basal ganglia. EXAM: CT HEAD WITHOUT CONTRAST TECHNIQUE: Contiguous axial images were obtained from the base of the skull through the vertex without intravenous contrast. COMPARISON:  Brain MRI, CTA, CT perfusion, and plain head CT yesterday. FINDINGS: Brain: Subtle hypodensity in the posterior left lentiform or external capsule corresponding to some of the diffusion restriction demonstrated yesterday. Elsewhere stable gray-white matter differentiation throughout the brain. No midline shift, ventriculomegaly, mass effect, evidence of mass lesion, intracranial hemorrhage or new cortically based acute infarction. Vascular: Calcified atherosclerosis at the skull base. Skull: No acute osseous abnormality identified. Sinuses/Orbits: Visualized paranasal sinuses and mastoids are stable and well pneumatized. Other: No acute orbit or scalp soft tissue findings. IMPRESSION: 1. Subtle evidence of the left basal ganglia lacunar infarct seen by MRI yesterday. No associated hemorrhage or mass effect. The other small lacunes remain occult by CT. 2. No new intracranial abnormality. Electronically Signed   By: Genevie Ann M.D.   On: 09/15/2017 14:37   Ct Angio Neck W Or Wo Contrast  Result Date: 09/14/2017 CLINICAL DATA:  84 y/o M; right-sided weakness, facial droop, slurred speech. EXAM: CT ANGIOGRAPHY HEAD AND NECK CT PERFUSION BRAIN TECHNIQUE: Multidetector CT imaging of the head and neck was performed using the standard protocol during bolus administration of intravenous contrast. Multiplanar CT image reconstructions and MIPs were obtained to evaluate the vascular anatomy. Carotid stenosis measurements (when applicable) are obtained utilizing NASCET criteria, using the distal internal carotid diameter as the denominator. Multiphase CT imaging of the brain was performed following IV bolus contrast injection. Subsequent parametric  perfusion maps were calculated using RAPID software. CONTRAST:  110mL ISOVUE-370 IOPAMIDOL (ISOVUE-370) INJECTION 76% COMPARISON:  09/08/2017 CT head. FINDINGS: CTA NECK FINDINGS Aortic arch: Standard branching. Imaged portion shows no evidence of aneurysm or dissection. No significant stenosis of the major arch vessel origins. Moderate calcific atherosclerosis of aortic arch. Right carotid system: No evidence of dissection, stenosis (50% or greater) or occlusion. Non stenotic calcific atherosclerosis of the carotid bifurcation. Left carotid system: No evidence of dissection, stenosis (50% or greater) or occlusion. Non stenotic calcific atherosclerosis of the carotid bifurcation. Vertebral arteries: Left dominant. No evidence of dissection, stenosis (50% or greater) or occlusion. Skeleton: Moderate cervical spondylosis with multilevel disc and facet degenerative changes. No high-grade bony canal stenosis. Other neck: Left submandibular gland atrophy. Upper chest: Negative. 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 Calcific atherosclerosis of the carotid siphons with mild bilateral paraclinoid stenosis. Posterior circulation: No significant stenosis, proximal occlusion, aneurysm, or vascular malformation. Calcific atherosclerosis of vertebral arteries of mild right V4 stenosis. Venous sinuses: As permitted by contrast timing, patent. Anatomic variants: Patent anterior communicating artery. No posterior communicating artery identified, likely hypoplastic or absent. Review of the MIP images confirms the above findings CT Brain Perfusion Findings: CBF (<30%) Volume: 37mL Perfusion (Tmax>6.0s) volume: 70mL Mismatch Volume: NA. Infarction Location:The AIF and VOF curves had a slow down slope and did not reach equilibrium within scan time. Quantification may be mildly  inaccurate, however, there is no asymmetric perfusion abnormality to indicate infarction. IMPRESSION: 1. Patent carotid and vertebral arteries. No dissection, aneurysm, or hemodynamically significant stenosis utilizing NASCET criteria. 2. Patent anterior and posterior intracranial circulation. No large vessel occlusion or high-grade stenosis. 3. 5 mm anterior communicating artery aneurysm. 4. No evidence for focal acute stroke on perfusion imaging. These results were called by telephone at the time of interpretation on 09/14/2017 at 5:00 am to Dr. Amie Portland , who verbally acknowledged these results. Electronically Signed   By: Kristine Garbe M.D.   On: 09/14/2017 05:10   Mr Brain Wo Contrast  Result Date: 09/14/2017 CLINICAL DATA:  Right-sided weakness and slurred speech. EXAM: MRI HEAD WITHOUT CONTRAST TECHNIQUE: Multiplanar, multiecho pulse sequences of the brain and surrounding structures were obtained without intravenous contrast. COMPARISON:  Head CT, CTA, and perfusion 09/14/2017 FINDINGS: Brain: There is an acute lateral lenticulostriate territory infarct involving the posterior left basal ganglia, corona radiata, and external capsule. Punctate acute infarcts are also present in both occipital lobes and posterior right frontal cortex. No intracranial hemorrhage, mass, midline shift, or extra-axial fluid collection is identified. There is moderate generalized cerebral atrophy. Periventricular white matter T2 hyperintensities are nonspecific but compatible with mild-to-moderate chronic small vessel ischemic disease. There is a chronic infarct in the right pons. Vascular: Major intracranial vascular flow voids are preserved. Skull and upper cervical spine: Unremarkable bone marrow signal. Sinuses/Orbits: Bilateral cataract extraction. Trace left maxillary sinus mucosal thickening. Trace bilateral mastoid fluid. Other: None. IMPRESSION: 1. Acute left basal ganglia infarct. 2. Punctate acute  infarcts in the right frontal and posterior occipital lobes. 3. Mild-to-moderate chronic small vessel ischemic disease and moderate cerebral atrophy. Electronically Signed   By: Logan Bores M.D.   On: 09/14/2017 08:08   Ct Cerebral Perfusion W Contrast  Result Date: 09/14/2017 CLINICAL DATA:  84 y/o M; right-sided weakness, facial droop, slurred speech. EXAM: CT ANGIOGRAPHY HEAD AND NECK CT PERFUSION BRAIN TECHNIQUE: Multidetector CT imaging of the head and neck was performed using the standard protocol during bolus administration of intravenous contrast. Multiplanar CT image reconstructions and MIPs were obtained to evaluate the vascular anatomy. Carotid stenosis measurements (when applicable) are obtained utilizing NASCET criteria, using the distal internal carotid diameter as the denominator. Multiphase CT imaging of the brain was performed following IV bolus contrast injection. Subsequent parametric perfusion maps were calculated using RAPID software. CONTRAST:  136mL ISOVUE-370 IOPAMIDOL (ISOVUE-370) INJECTION 76% COMPARISON:  09/08/2017 CT head. FINDINGS: CTA NECK FINDINGS Aortic arch: Standard branching. Imaged portion shows no evidence of aneurysm or dissection. No significant stenosis of the major arch vessel origins. Moderate calcific atherosclerosis of aortic arch. Right carotid system: No evidence of dissection, stenosis (50% or greater) or occlusion. Non stenotic calcific atherosclerosis of the carotid bifurcation. Left carotid system: No evidence of dissection, stenosis (50% or greater) or occlusion. Non stenotic calcific atherosclerosis of the carotid  bifurcation. Vertebral arteries: Left dominant. No evidence of dissection, stenosis (50% or greater) or occlusion. Skeleton: Moderate cervical spondylosis with multilevel disc and facet degenerative changes. No high-grade bony canal stenosis. Other neck: Left submandibular gland atrophy. Upper chest: Negative. 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 Calcific atherosclerosis of the carotid siphons with mild bilateral paraclinoid stenosis. Posterior circulation: No significant stenosis, proximal occlusion, aneurysm, or vascular malformation. Calcific atherosclerosis of vertebral arteries of mild right V4 stenosis. Venous sinuses: As permitted by contrast timing, patent. Anatomic variants: Patent anterior communicating artery. No posterior communicating artery identified, likely hypoplastic or absent. Review of the MIP images confirms the above findings CT Brain Perfusion Findings: CBF (<30%) Volume: 40mL Perfusion (Tmax>6.0s) volume: 52mL Mismatch Volume: NA. Infarction Location:The AIF and VOF curves had a slow down slope and did not reach equilibrium within scan time. Quantification may be mildly inaccurate, however, there is no asymmetric perfusion abnormality to indicate infarction. IMPRESSION: 1. Patent carotid and vertebral arteries. No dissection, aneurysm, or hemodynamically significant stenosis utilizing NASCET criteria. 2. Patent anterior and posterior intracranial circulation. No large vessel occlusion or high-grade stenosis. 3. 5 mm anterior communicating artery aneurysm. 4. No evidence for focal acute stroke on perfusion imaging. These results were called by telephone at the time of interpretation on 09/14/2017 at 5:00 am to Dr. Amie Portland , who verbally acknowledged these results. Electronically Signed   By: Kristine Garbe M.D.   On: 09/14/2017 05:10   Dg Swallowing Func-speech Pathology  Result Date: 09/16/2017 Objective Swallowing Evaluation: Type of Study: MBS-Modified Barium Swallow Study  Patient Details Name: FRANCIS DOENGES MRN: [id number] Date of Birth: 04/01/1931 Today's Date: 09/16/2017 Time: SLP Start Time (ACUTE ONLY): 1030 -SLP Stop Time (ACUTE ONLY): 1100 SLP Time  Calculation (min) (ACUTE ONLY): 30 min Past Medical History: Past Medical History: Diagnosis Date . A-fib ([place])  . AAA (abdominal aortic aneurysm) ([place])  . Abnormality of gait 12/20/2013 . Anxiety and depression  . Anxiety and depression  . Arthritis  . Barrett's esophagus  . BPH (benign prostatic hyperplasia)  . Degenerative arthritis  . GERD (gastroesophageal reflux disease)  . History of concussion  . HOH (hard of hearing)  . Hyperlipidemia  . Hypertension  . Left tibial fracture  . Macular degeneration Left . Polyneuropathy in other diseases classified elsewhere ([place]) 12/20/2013 . Right clavicle fracture  . Spinal stenosis  . Transient global amnesia  . Vertigo  Past Surgical History: Past Surgical History: Procedure Laterality Date . ABDOMINAL AORTIC ANEURYSM REPAIR  2016  stent placement . APPENDECTOMY   . CATARACT EXTRACTION    Bilateral . KNEE SURGERY Bilateral   both  knees replaced . LAPAROSCOPIC RIGHT HEMI COLECTOMY   . LUMBAR LAMINECTOMY    L4  . REFRACTIVE SURGERY Left   macular degeneration . TONSILLECTOMY AND ADENOIDECTOMY   . TOTAL HIP ARTHROPLASTY Right   x 2 . WRIST FRACTURE SURGERY Right  HPI: Pt is a 84 yo male, retired [employer], now living at [place], with hx of atrial fibrillation on Pradaxa, AAA, anxiety and depression, hypertension, hyperlipidemia, TGA's in the past, severe peripheral neuropathy. Pt admitted for stroke work up with MRI revealing "1. Acute left basal ganglia infarct. 2. Punctate acute infarcts in the right frontal and posterior. 3. Mild-to-moderate chronic small vessel ischemic disease and moderate cerebral atrophy." Daughter reports recent pneumonia. ED  visit noted 08/13/17. Most recent CXR 5/29 shows "Worsening left basilar airspace opacity is concerning for recurrent pneumonia." Pt underwent MBS 5/30, with results revealing functional swallow, recs for regular diet, thin liquids.  Pt with subsequent decline in neuro function that afternoon with worsening dysarthria and CN  deficits.  Subjective: lethargic Assessment / Plan / Recommendation CH[facility] IP CLINICAL IMPRESSIONS 09/16/2017 Clinical Impression Pt presents with a moderate oral, mild pharyngeal dysphagia, which is a decline in function since yesterday's study.  Today the oral phase is c/b marked discoordination with diffuse spreading of bolus within oral cavity, prolonged oral preparation, piecemeal bolusing.  Thin liquids, when consumed in large boluses, were noted to be aspirated before the swallow with no associated cough response.  Nectars and purees are tolerated best, particularly given pt's lethargy.  Recommend resuming this modified diet - dysphagia 1, nectar thick liquids; give meds whole with puree.  Discussed results of study and recommendations, risks for aspiration, with pt's daughter, Delsa Sale, after the study.  She verbalizes understanding of precautions and rationale.  SLP will follow for dysphagia treatment.   SLP Visit Diagnosis Dysphagia, oropharyngeal phase (R13.12) Attention and concentration deficit following -- Frontal lobe and executive function deficit following -- Impact on safety and function Mild aspiration risk   CHL IP TREATMENT RECOMMENDATION 09/16/2017 Treatment Recommendations Therapy as outlined in treatment plan below   Prognosis 09/16/2017 Prognosis for Safe Diet Advancement Good Barriers to Reach Goals -- Barriers/Prognosis Comment -- CHL IP DIET RECOMMENDATION 09/16/2017 SLP Diet Recommendations Dysphagia 1 (Puree) solids;Nectar thick liquid Liquid Administration via Cup Medication Administration Whole meds with puree Compensations Minimize environmental distractions;Slow rate;Small sips/bites Postural Changes --   CHL IP OTHER RECOMMENDATIONS 09/16/2017 Recommended Consults -- Oral Care Recommendations Oral care BID Other Recommendations --   CHL IP FOLLOW UP RECOMMENDATIONS 09/16/2017 Follow up Recommendations Other (comment)   CHL IP FREQUENCY AND DURATION 09/16/2017 Speech Therapy Frequency (ACUTE ONLY)  min 2x/week Treatment Duration 2 weeks      CHL IP ORAL PHASE 09/16/2017 Oral Phase Impaired Oral - Pudding Teaspoon -- Oral - Pudding Cup -- Oral - Honey Teaspoon -- Oral - Honey Cup -- Oral - Nectar Teaspoon -- Oral - Nectar Cup Weak lingual manipulation;Reduced posterior propulsion;Lingual/palatal residue;Piecemeal swallowing;Delayed oral transit;Decreased bolus cohesion Oral - Nectar Straw -- Oral - Thin Teaspoon -- Oral - Thin Cup Weak lingual manipulation;Piecemeal swallowing;Delayed oral transit;Decreased bolus cohesion Oral - Thin Straw -- Oral - Puree Weak lingual manipulation;Reduced posterior propulsion;Lingual/palatal residue;Piecemeal swallowing;Delayed oral transit;Decreased bolus cohesion Oral - Mech Soft -- Oral - Regular -- Oral - Multi-Consistency -- Oral - Pill -- Oral Phase - Comment --  CHL IP PHARYNGEAL PHASE 09/16/2017 Pharyngeal Phase Impaired Pharyngeal- Pudding Teaspoon -- Pharyngeal -- Pharyngeal- Pudding Cup -- Pharyngeal -- Pharyngeal- Honey Teaspoon -- Pharyngeal -- Pharyngeal- Honey Cup -- Pharyngeal -- Pharyngeal- Nectar Teaspoon -- Pharyngeal -- Pharyngeal- Nectar Cup Delayed swallow initiation-vallecula;Reduced tongue base retraction;Penetration/Aspiration before swallow;Pharyngeal residue - valleculae Pharyngeal Material enters airway, remains ABOVE vocal cords then ejected out Pharyngeal- Nectar Straw -- Pharyngeal -- Pharyngeal- Thin Teaspoon -- Pharyngeal -- Pharyngeal- Thin Cup Delayed swallow initiation-pyriform sinuses;Penetration/Aspiration before swallow;Trace aspiration Pharyngeal Material enters airway, passes BELOW cords without attempt by patient to eject out (silent aspiration) Pharyngeal- Thin Straw -- Pharyngeal -- Pharyngeal- Puree Delayed swallow initiation-vallecula;Other (Comment) Pharyngeal -- Pharyngeal- Mechanical Soft -- Pharyngeal -- Pharyngeal- Regular -- Pharyngeal -- Pharyngeal- Multi-consistency -- Pharyngeal -- Pharyngeal- Pill -- Pharyngeal -- Pharyngeal  Comment --  No flowsheet data found. No flowsheet  data found. [name] 09/16/2017, 11:51 AM              Dg Swallowing Func-speech Pathology  Result Date: 09/15/2017 Objective Swallowing Evaluation: Type of Study: MBS-Modified Barium Swallow Study  Patient Details Name: OTHA MONICAL MRN: [id number] Date of Birth: [birth year] Today's Date: 09/15/2017 Time: SLP Start Time (ACUTE ONLY): 10 -SLP Stop Time (ACUTE ONLY): 1200 SLP Time Calculation (min) (ACUTE ONLY): 30 min Past Medical History: Past Medical History: Diagnosis Date . A-fib ([place])  . AAA (abdominal aortic aneurysm) ([place])  . Abnormality of gait 12/20/2013 . Anxiety and depression  . Anxiety and depression  . Arthritis  . Barrett's esophagus  . BPH (benign prostatic hyperplasia)  . Degenerative arthritis  . GERD (gastroesophageal reflux disease)  . History of concussion  . HOH (hard of hearing)  . Hyperlipidemia  . Hypertension  . Left tibial fracture  . Macular degeneration Left . Polyneuropathy in other diseases classified elsewhere ([place]) 12/20/2013 . Right clavicle fracture  . Spinal stenosis  . Transient global amnesia  . Vertigo  Past Surgical History: Past Surgical History: Procedure Laterality Date . ABDOMINAL AORTIC ANEURYSM REPAIR  2016  stent placement . APPENDECTOMY   . CATARACT EXTRACTION    Bilateral . KNEE SURGERY Bilateral   both  knees replaced . LAPAROSCOPIC RIGHT HEMI COLECTOMY   . LUMBAR LAMINECTOMY    L4  . REFRACTIVE SURGERY Left   macular degeneration . TONSILLECTOMY AND ADENOIDECTOMY   . TOTAL HIP ARTHROPLASTY Right   x 2 . WRIST FRACTURE SURGERY Right  HPI: Pt is a 84 yo male, retired [employer], now living at [facility], with hx of atrial fibrillation on Pradaxa, AAA, anxiety and depression, hypertension, hyperlipidemia, TGA's in the past, severe peripheral neuropathy. Pt admitted for stroke work up with MRI revealing "1. Acute left basal ganglia infarct. 2. Punctate acute infarcts in the right frontal and posterior. 3.  Mild-to-moderate chronic small vessel ischemic disease and moderate cerebral atrophy." Daughter reports recent pneumonia. ED visit noted 08/13/17. Most recent CXR 5/29 shows "Worsening left basilar airspace opacity is concerning for recurrent pneumonia"  Subjective: alert, participatory Assessment / Plan / Recommendation CHL IP CLINICAL IMPRESSIONS 09/15/2017 Clinical Impression Pt presents with a functional oropharyngeal swallow with adequate mastication, inconsistent penetration of thin liquids (Penetration-Aspiration Scale rating of #2 - penetrate is above the vocal cords and ejected from larynx upon completion of the swallow).  There was no aspiration, despite consumption of large, consecutive thin liquid boluses.  13 mm barium pill transfered through pharynx and esophagus without delay.  Recommend continuing a regular diet, advance liquids to thin, meds to be consumed with liquids.  No further SLP f/u for swallowing; speech/language evaluation pending.  SLP Visit Diagnosis Dysphagia, oropharyngeal phase (R13.12) Attention and concentration deficit following -- Frontal lobe and executive function deficit following -- Impact on safety and function No limitations   CHL IP TREATMENT RECOMMENDATION 09/15/2017 Treatment Recommendations No treatment recommended at this time   Prognosis 09/14/2017 Prognosis for Safe Diet Advancement Good Barriers to Reach Goals -- Barriers/Prognosis Comment -- No flowsheet data found.  CHL IP OTHER RECOMMENDATIONS 09/15/2017 Recommended Consults -- Oral Care Recommendations Oral care BID Other Recommendations --   CHL IP FOLLOW UP RECOMMENDATIONS 09/15/2017 Follow up Recommendations None   No flowsheet data found.     CHL IP ORAL PHASE 09/15/2017 Oral Phase WFL Oral - Pudding Teaspoon -- Oral - Pudding Cup -- Oral - Honey Teaspoon -- Oral - Honey Cup -- Oral -  Nectar Teaspoon -- Oral - Nectar Cup -- Oral - Nectar Straw -- Oral - Thin Teaspoon -- Oral - Thin Cup -- Oral - Thin Straw -- Oral -  Puree -- Oral - Mech Soft -- Oral - Regular -- Oral - Multi-Consistency -- Oral - Pill -- Oral Phase - Comment --  CHL IP PHARYNGEAL PHASE 09/15/2017 Pharyngeal Phase WFL Pharyngeal- Pudding Teaspoon -- Pharyngeal -- Pharyngeal- Pudding Cup -- Pharyngeal -- Pharyngeal- Honey Teaspoon -- Pharyngeal -- Pharyngeal- Honey Cup -- Pharyngeal -- Pharyngeal- Nectar Teaspoon -- Pharyngeal -- Pharyngeal- Nectar Cup -- Pharyngeal -- Pharyngeal- Nectar Straw -- Pharyngeal -- Pharyngeal- Thin Teaspoon -- Pharyngeal -- Pharyngeal- Thin Cup -- Pharyngeal -- Pharyngeal- Thin Straw -- Pharyngeal -- Pharyngeal- Puree -- Pharyngeal -- Pharyngeal- Mechanical Soft -- Pharyngeal -- Pharyngeal- Regular -- Pharyngeal -- Pharyngeal- Multi-consistency -- Pharyngeal -- Pharyngeal- Pill -- Pharyngeal -- Pharyngeal Comment --  No flowsheet data found. No flowsheet data found. [name] 09/15/2017, 12:59 PM              Ct Head Code Stroke Wo Contrast  Result Date: 09/14/2017 CLINICAL DATA:  Code stroke. 84 y/o M; right-sided weakness, facial droop, slurred speech. EXAM: CT HEAD WITHOUT CONTRAST TECHNIQUE: Contiguous axial images were obtained from the base of the skull through the vertex without intravenous contrast. COMPARISON:  None. FINDINGS: Brain: No evidence of acute infarction, hemorrhage, hydrocephalus, extra-axial collection or mass lesion/mass effect. Chronic microvascular ischemic changes and parenchymal volume loss of the brain. Vascular: Calcific atherosclerosis of vertebral arteries and carotid siphons. No hyperdense vessel identified. Skull: Normal. Negative for fracture or focal lesion. Sinuses/Orbits: No acute finding. Other: None. ASPECTS [facility] Stroke Program Early CT Score) - Ganglionic level infarction (caudate, lentiform nuclei, internal capsule, insula, M1-M3 cortex): 7 - Supraganglionic infarction (M4-M6 cortex): 3 Total score (0-10 with 10 being normal): 10 IMPRESSION: 1. No acute intracranial  abnormality identified. 2. ASPECTS is 10 3. Chronic microvascular ischemic changes and parenchymal volume loss of the brain These results were communicated to Dr. Rory Percy at [facility] 5/29/2019by text page via the [facility] messaging system. Electronically Signed   By: Kristine Garbe M.D.   On: 09/14/2017 04:18      Subjective: - no chest pain, shortness of breath, no abdominal pain, nausea or vomiting.   Discharge Exam: Vitals:   09/19/17 0843 09/19/17 1250  BP: (!) 171/85 (!) 153/78  Pulse: (!) 55 (!) 53  Resp: 20 20  Temp: 97.7 F (36.5 C) 97.9 F (36.6 C)  SpO2: 96% 98%    General: Pt is alert, awake, not in acute distress Cardiovascular: RRR, S1/S2 +, no rubs, no gallops Respiratory: CTA bilaterally, no wheezing, no rhonchi Abdominal: Soft, NT, ND, bowel sounds +   The results of significant diagnostics from this hospitalization (including imaging, microbiology, ancillary and laboratory) are listed below for reference.     Microbiology: No results found for this or any previous visit (from the past 240 hour(s)).   Labs: BNP (last 3 results) Recent Labs    08/15/17 1132  BNP 272.5*   Basic Metabolic Panel: Recent Labs  Lab 09/14/17 0359 09/14/17 0406 09/19/17 0830  NA 132* 131* 139  K 4.5 4.3 3.8  CL 98* 96* 106  CO2 25  --  24  GLUCOSE 122* 117* 92  BUN 18 18 10   CREATININE 1.03 1.00 0.92  CALCIUM 9.3  --  9.1   Liver Function Tests: Recent Labs  Lab 09/14/17 0359  AST 29  ALT  23  ALKPHOS 58  BILITOT 1.0  PROT 6.1*  ALBUMIN 3.5   No results for input(s): LIPASE, AMYLASE in the last 168 hours. No results for input(s): AMMONIA in the last 168 hours. CBC: Recent Labs  Lab 09/14/17 0359 09/14/17 0406 09/16/17 0428 09/17/17 0325 09/18/17 0728 09/19/17 0830  WBC 5.8  --  8.2 7.7 7.4 7.3  NEUTROABS 3.6  --   --   --   --   --   HGB 14.6 14.6 14.0 14.2 15.4 14.6  HCT 43.5 43.0 41.6 41.8 45.4 43.5  MCV 92.0  --  92.7 92.9 92.3 93.1  PLT  143*  --  145* 130* 135* 131*   Cardiac Enzymes: No results for input(s): CKTOTAL, CKMB, CKMBINDEX, TROPONINI in the last 168 hours. BNP: Invalid input(s): POCBNP CBG: Recent Labs  Lab 09/14/17 0401  GLUCAP 122*   D-Dimer No results for input(s): DDIMER in the last 72 hours. Hgb A1c No results for input(s): HGBA1C in the last 72 hours. Lipid Profile No results for input(s): CHOL, HDL, LDLCALC, TRIG, CHOLHDL, LDLDIRECT in the last 72 hours. Thyroid function studies No results for input(s): TSH, T4TOTAL, T3FREE, THYROIDAB in the last 72 hours.  Invalid input(s): FREET3 Anemia work up No results for input(s): VITAMINB12, FOLATE, FERRITIN, TIBC, IRON, RETICCTPCT in the last 72 hours.   Time coordinating discharge: 40 minutes  SIGNED:  Marzetta Board, MD  [facility] 09/19/2017, 3:42 PM Pager [phone]  If 7PM-7AM, please contact night-coverage [email or web address] Password [email or web address]

## 2017-09-19 NOTE — Consult Note (Signed)
   [facility] CM Inpatient Consult   09/19/2017  Hilman Kissling [facility] 08-02-1930 [id number]    Patient screened for potential [facility] Care Management services due to hospital readmission.  Chart reviewed. Noted discharge plan is for CIR.  No identifiable [facility] Care Management needs noted at this time.   Marthenia Rolling, MSN-Ed, RN,BSN [facility] Liaison [phone]

## 2017-09-19 NOTE — Progress Notes (Signed)
Patient ID: Walter Bryant, male   DOB: [birth year], 84 y.o.   MRN: [id number] admiat to unit via bed. Oriented to unit, rehab routine and plan of care. Reviewed team conference schedule and MD rounds. Margarito Liner

## 2017-09-19 NOTE — Progress Notes (Signed)
Physical Medicine and Rehabilitation Consult Reason for Consult: Right side weakness and slurred speech Referring Physician: [facility]   HPI: Walter Bryant is a 84 y.o. right-handed male with history of atrial fibrillation maintained on Pradaxa, AAA, hypertension.  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  Independent with assistive device prior to admission.  Presented 09/14/2017 with right-sided weakness and slurred speech as well as bouts of dizziness.  Cranial CT scan negative.  CT angiogram of head and neck with no dissection or aneurysm.  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  Punctate acute infarcts in the right frontal and posterior occipital lobes.  Echocardiogram pending.  Patient did not receive TPA.  Neurology follow-up currently maintained on Pradaxa as prior to admission with the addition of aspirin therapy.  Regular consistency diet with nectar thick liquids.  Physical therapy evaluation completed with recommendations of physical medicine rehab consult.   Review of Systems  Constitutional: Negative for chills and fever.  HENT: Positive for hearing loss.   Eyes: Negative for double vision.  Respiratory: Negative for cough and shortness of breath.   Cardiovascular: Negative for chest pain, palpitations and leg swelling.  Gastrointestinal: Positive for constipation. Negative for nausea and vomiting.       GERD  Genitourinary: Positive for urgency.  Neurological: Positive for dizziness, sensory change, speech change and focal weakness.       Vertigo  Psychiatric/Behavioral: Positive for depression.       Anxiety  All other systems reviewed and are negative.      Past Medical History:  Diagnosis Date  . A-fib ([place])   . AAA (abdominal aortic aneurysm) ([place])   . Abnormality of gait 12/20/2013  . Anxiety and depression   . Anxiety and  depression   . Arthritis   . Barrett's esophagus   . BPH (benign prostatic hyperplasia)   . Degenerative arthritis   . GERD (gastroesophageal reflux disease)   . History of concussion   . HOH (hard of hearing)   . Hyperlipidemia   . Hypertension   . Left tibial fracture   . Macular degeneration Left  . Polyneuropathy in other diseases classified elsewhere ([place]) 12/20/2013  . Right clavicle fracture   . Spinal stenosis   . Transient global amnesia   . Vertigo         Past Surgical History:  Procedure Laterality Date  . ABDOMINAL AORTIC ANEURYSM REPAIR  2016   stent placement  . APPENDECTOMY    . CATARACT EXTRACTION     Bilateral  . KNEE SURGERY Bilateral    both  knees replaced  . LAPAROSCOPIC RIGHT HEMI COLECTOMY    . LUMBAR LAMINECTOMY     L4   . REFRACTIVE SURGERY Left    macular degeneration  . TONSILLECTOMY AND ADENOIDECTOMY    . TOTAL HIP ARTHROPLASTY Right    x 2  . WRIST FRACTURE SURGERY Right         Family History  Problem Relation Age of Onset  . Heart disease Sister        AFIB  . Neuropathy Neg Hx    Social History:  reports that he quit smoking about 51 years ago. He has never used smokeless tobacco. He reports that he does not drink alcohol or use drugs. Allergies:       Allergies  Allergen Reactions  . Other     vicroyl  sutures  . Morphine Other (See Comments)    Edgy   . Oxycodone Other (See Comments)    Edgy          Medications Prior to Admission  Medication Sig Dispense Refill  . ALPRAZolam (XANAX) 0.25 MG tablet Take 1 tablet (0.25 mg total) by mouth daily as needed for anxiety. 30 tablet 0  . Ascorbic Acid (VITAMIN C) 1000 MG tablet Take 1,000 mg by mouth daily.    Marland Kitchen aspirin 81 MG tablet Take 81 mg by mouth at bedtime.     . Calcium-Magnesium-Vitamin D (CALCIUM 500 PO) Take 1 tablet by mouth daily.    . dabigatran (PRADAXA) 150 MG CAPS capsule Take 150 mg by mouth 2 (two) times  daily.    . fexofenadine (ALLEGRA) 180 MG tablet Take 180 mg by mouth every morning.    . fluticasone (FLONASE) 50 MCG/ACT nasal spray Place 2 sprays into both nostrils daily for 7 days. (Patient taking differently: Place 2 sprays into both nostrils daily as needed for allergies. ) 1 g 0  . furosemide (LASIX) 20 MG tablet Take 1 tablet (20 mg total) by mouth daily. 30 tablet 0  . ibuprofen (ADVIL,MOTRIN) 200 MG tablet Take 200-600 mg by mouth every 6 (six) hours as needed for moderate pain.     Marland Kitchen lisinopril (PRINIVIL,ZESTRIL) 5 MG tablet Take 5 mg by mouth daily after lunch.     . LUTEIN PO Take 25 mg by mouth daily after lunch.     . metoprolol succinate (TOPROL-XL) 50 MG 24 hr tablet Take 50 mg by mouth 2 (two) times daily. Take with or immediately following a meal.     . Multiple Vitamin (MULTIVITAMIN) tablet Take 1 tablet by mouth daily.    . Omega-3 Fatty Acids (OMEGA 3 PO) Take 1 tablet by mouth daily after lunch.     Marland Kitchen omeprazole (PRILOSEC) 20 MG capsule Take 20 mg by mouth every morning.     Marland Kitchen POTASSIUM PO Take 1 tablet by mouth daily after lunch.     . pregabalin (LYRICA) 75 MG capsule Take 75 mg by mouth 4 (four) times daily as needed (pain).     . ranitidine (ZANTAC) 150 MG tablet Take 150 mg by mouth 2 (two) times daily. Take in the afternoon and bedtime    . Selenium 200 MCG TABS Take 1 tablet by mouth daily after lunch.     . simvastatin (ZOCOR) 20 MG tablet Take 20 mg by mouth every morning.     . sodium chloride (OCEAN) 0.65 % SOLN nasal spray Place 1 spray into both nostrils every morning.    . tamsulosin (FLOMAX) 0.4 MG CAPS capsule Take 0.4 mg by mouth at bedtime.     Marland Kitchen zinc gluconate 50 MG tablet Take 50 mg by mouth every morning.       Home: Home Living Family/patient expects to be discharged to:: Private residence Living Arrangements: Spouse/significant other Available Help at Discharge: Family, Available 24 hours/day Type of Home:  House Home Access: Level entry [place]: One level Bathroom Shower/Tub: [place]: Environmental consultant - 4 wheels, Shower seat, Grab bars - toilet, Grab bars - tub/shower, Bedside commode Additional Comments: From [employer]  Functional History: Prior Function Level of Independence: Needs assistance Gait / Transfers Assistance Needed: Rollator for mobility ADL's / Homemaking Assistance Needed: Aide assisting with bathing due to hx of falls in shower and balance deficits Functional Status:  Mobility: Bed Mobility  Overal bed mobility: Needs Assistance Bed Mobility: Supine to Sit, Sit to Supine Supine to sit: Min assist Sit to supine: Mod assist General bed mobility comments: Pt declining bed mobility at this time Transfers Overall transfer level: Needs assistance Equipment used: Rolling walker (2 wheeled) Transfers: Sit to/from Stand Sit to Stand: Mod assist General transfer comment: Pt declining transfers at this time Ambulation/Gait General Gait Details: Not tested  ADL: ADL Overall ADL's : Needs assistance/impaired Eating/Feeding: Set up, Bed level Grooming: Set up, Bed level General ADL Comments: Pt declining bed mobility or transfers at this time. Reports he feels he is slowly returning to baseline but still feels RLE is weak.   Cognition: Cognition Overall Cognitive Status: Within Functional Limits for tasks assessed Orientation Level: Oriented X4 Cognition Arousal/Alertness: Awake/alert Behavior During Therapy: WFL for tasks assessed/performed Overall Cognitive Status: Within Functional Limits for tasks assessed  Blood pressure (!) 150/87, pulse 72, temperature 98.2 F (36.8 C), temperature source Oral, resp. rate 18, height 6\' 2"  (1.88 m), weight 98 kg (216 lb 0.8 oz), SpO2 96 %. Physical Exam  HENT:  Head: Normocephalic.  Eyes: Pupils are equal, round, and reactive to light.  Neck: Normal range of motion.    Cardiovascular: Normal rate.  Respiratory: Effort normal.  GI: Soft.  Musculoskeletal: He exhibits no edema.  Neurological:  Alert.  Speech is a bit dysarthric but intelligible.  Follows full commands. Right central 7. RUE 4- to 4/5 with decreased FTN, RLE: 3/5 prox to 4/5 distally. Stocking glove sensory changes in both legs to knees.  Psychiatric:  Pleasant but anxious    LabResultsLast24Hours  No results found for this or any previous visit (from the past 24 hour(s)).    ImagingResults(Last48hours)  Ct Angio Head W Or Wo Contrast  Result Date: 09/14/2017 CLINICAL DATA:  84 y/o M; right-sided weakness, facial droop, slurred speech. EXAM: CT ANGIOGRAPHY HEAD AND NECK CT PERFUSION BRAIN TECHNIQUE: Multidetector CT imaging of the head and neck was performed using the standard protocol during bolus administration of intravenous contrast. Multiplanar CT image reconstructions and MIPs were obtained to evaluate the vascular anatomy. Carotid stenosis measurements (when applicable) are obtained utilizing NASCET criteria, using the distal internal carotid diameter as the denominator. Multiphase CT imaging of the brain was performed following IV bolus contrast injection. Subsequent parametric perfusion maps were calculated using RAPID software. CONTRAST:  147mL ISOVUE-370 IOPAMIDOL (ISOVUE-370) INJECTION 76% COMPARISON:  09/08/2017 CT head. FINDINGS: CTA NECK FINDINGS Aortic arch: Standard branching. Imaged portion shows no evidence of aneurysm or dissection. No significant stenosis of the major arch vessel origins. Moderate calcific atherosclerosis of aortic arch. Right carotid system: No evidence of dissection, stenosis (50% or greater) or occlusion. Non stenotic calcific atherosclerosis of the carotid bifurcation. Left carotid system: No evidence of dissection, stenosis (50% or greater) or occlusion. Non stenotic calcific atherosclerosis of the carotid bifurcation. Vertebral arteries: Left  dominant. No evidence of dissection, stenosis (50% or greater) or occlusion. Skeleton: Moderate cervical spondylosis with multilevel disc and facet degenerative changes. No high-grade bony canal stenosis. Other neck: Left submandibular gland atrophy. Upper chest: Negative. 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 Calcific atherosclerosis of the carotid siphons with mild bilateral paraclinoid stenosis. Posterior circulation: No significant stenosis, proximal occlusion, aneurysm, or vascular malformation. Calcific atherosclerosis of vertebral arteries of mild right V4 stenosis. Venous sinuses: As permitted  by contrast timing, patent. Anatomic variants: Patent anterior communicating artery. No posterior communicating artery identified, likely hypoplastic or absent. Review of the MIP images confirms the above findings CT Brain Perfusion Findings: CBF (<30%) Volume: 50mL Perfusion (Tmax>6.0s) volume: 2mL Mismatch Volume: NA. Infarction Location:The AIF and VOF curves had a slow down slope and did not reach equilibrium within scan time. Quantification may be mildly inaccurate, however, there is no asymmetric perfusion abnormality to indicate infarction. IMPRESSION: 1. Patent carotid and vertebral arteries. No dissection, aneurysm, or hemodynamically significant stenosis utilizing NASCET criteria. 2. Patent anterior and posterior intracranial circulation. No large vessel occlusion or high-grade stenosis. 3. 5 mm anterior communicating artery aneurysm. 4. No evidence for focal acute stroke on perfusion imaging. These results were called by telephone at the time of interpretation on 09/14/2017 at 5:00 am to Dr. Amie Portland , who verbally acknowledged these results. Electronically Signed   By: Kristine Garbe M.D.   On: 09/14/2017 05:10   Dg Chest 2 View  Result  Date: 09/14/2017 CLINICAL DATA:  Status post CVA. Recent pulmonary infiltrates. EXAM: CHEST - 2 VIEW COMPARISON:  Chest radiograph performed 08/15/2017 FINDINGS: Worsening left basilar airspace opacity is concerning for recurrent pneumonia. Mild vascular congestion is noted. Mild peribronchial thickening is seen. No significant pleural effusion or pneumothorax is identified. The cardiomediastinal silhouette is borderline normal in size. No acute osseous abnormalities are identified. Mild degenerative change is noted at the left glenohumeral joint. IMPRESSION: Worsening left basilar airspace opacity is concerning for recurrent pneumonia. Mild vascular congestion noted. Electronically Signed   By: Garald Balding M.D.   On: 09/14/2017 06:31   Ct Angio Neck W Or Wo Contrast  Result Date: 09/14/2017 CLINICAL DATA:  84 y/o M; right-sided weakness, facial droop, slurred speech. EXAM: CT ANGIOGRAPHY HEAD AND NECK CT PERFUSION BRAIN TECHNIQUE: Multidetector CT imaging of the head and neck was performed using the standard protocol during bolus administration of intravenous contrast. Multiplanar CT image reconstructions and MIPs were obtained to evaluate the vascular anatomy. Carotid stenosis measurements (when applicable) are obtained utilizing NASCET criteria, using the distal internal carotid diameter as the denominator. Multiphase CT imaging of the brain was performed following IV bolus contrast injection. Subsequent parametric perfusion maps were calculated using RAPID software. CONTRAST:  151mL ISOVUE-370 IOPAMIDOL (ISOVUE-370) INJECTION 76% COMPARISON:  09/08/2017 CT head. FINDINGS: CTA NECK FINDINGS Aortic arch: Standard branching. Imaged portion shows no evidence of aneurysm or dissection. No significant stenosis of the major arch vessel origins. Moderate calcific atherosclerosis of aortic arch. Right carotid system: No evidence of dissection, stenosis (50% or greater) or occlusion. Non stenotic calcific  atherosclerosis of the carotid bifurcation. Left carotid system: No evidence of dissection, stenosis (50% or greater) or occlusion. Non stenotic calcific atherosclerosis of the carotid bifurcation. Vertebral arteries: Left dominant. No evidence of dissection, stenosis (50% or greater) or occlusion. Skeleton: Moderate cervical spondylosis with multilevel disc and facet degenerative changes. No high-grade bony canal stenosis. Other neck: Left submandibular gland atrophy. Upper chest: Negative. 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 Calcific atherosclerosis of the carotid siphons with mild bilateral paraclinoid stenosis. Posterior circulation: No significant stenosis, proximal occlusion, aneurysm, or vascular malformation. Calcific atherosclerosis of vertebral arteries of mild right V4 stenosis. Venous sinuses: As permitted by contrast timing, patent. Anatomic variants: Patent anterior communicating artery. No posterior communicating artery identified, likely hypoplastic  or absent. Review of the MIP images confirms the above findings CT Brain Perfusion Findings: CBF (<30%) Volume: 27mL Perfusion (Tmax>6.0s) volume: 78mL Mismatch Volume: NA. Infarction Location:The AIF and VOF curves had a slow down slope and did not reach equilibrium within scan time. Quantification may be mildly inaccurate, however, there is no asymmetric perfusion abnormality to indicate infarction. IMPRESSION: 1. Patent carotid and vertebral arteries. No dissection, aneurysm, or hemodynamically significant stenosis utilizing NASCET criteria. 2. Patent anterior and posterior intracranial circulation. No large vessel occlusion or high-grade stenosis. 3. 5 mm anterior communicating artery aneurysm. 4. No evidence for focal acute stroke on perfusion imaging. These results were called by telephone at the  time of interpretation on 09/14/2017 at 5:00 am to Dr. Amie Portland , who verbally acknowledged these results. Electronically Signed   By: Kristine Garbe M.D.   On: 09/14/2017 05:10   Mr Brain Wo Contrast  Result Date: 09/14/2017 CLINICAL DATA:  Right-sided weakness and slurred speech. EXAM: MRI HEAD WITHOUT CONTRAST TECHNIQUE: Multiplanar, multiecho pulse sequences of the brain and surrounding structures were obtained without intravenous contrast. COMPARISON:  Head CT, CTA, and perfusion 09/14/2017 FINDINGS: Brain: There is an acute lateral lenticulostriate territory infarct involving the posterior left basal ganglia, corona radiata, and external capsule. Punctate acute infarcts are also present in both occipital lobes and posterior right frontal cortex. No intracranial hemorrhage, mass, midline shift, or extra-axial fluid collection is identified. There is moderate generalized cerebral atrophy. Periventricular white matter T2 hyperintensities are nonspecific but compatible with mild-to-moderate chronic small vessel ischemic disease. There is a chronic infarct in the right pons. Vascular: Major intracranial vascular flow voids are preserved. Skull and upper cervical spine: Unremarkable bone marrow signal. Sinuses/Orbits: Bilateral cataract extraction. Trace left maxillary sinus mucosal thickening. Trace bilateral mastoid fluid. Other: None. IMPRESSION: 1. Acute left basal ganglia infarct. 2. Punctate acute infarcts in the right frontal and posterior occipital lobes. 3. Mild-to-moderate chronic small vessel ischemic disease and moderate cerebral atrophy. Electronically Signed   By: Logan Bores M.D.   On: 09/14/2017 08:08   Ct Cerebral Perfusion W Contrast  Result Date: 09/14/2017 CLINICAL DATA:  84 y/o M; right-sided weakness, facial droop, slurred speech. EXAM: CT ANGIOGRAPHY HEAD AND NECK CT PERFUSION BRAIN TECHNIQUE: Multidetector CT imaging of the head and neck was performed using the  standard protocol during bolus administration of intravenous contrast. Multiplanar CT image reconstructions and MIPs were obtained to evaluate the vascular anatomy. Carotid stenosis measurements (when applicable) are obtained utilizing NASCET criteria, using the distal internal carotid diameter as the denominator. Multiphase CT imaging of the brain was performed following IV bolus contrast injection. Subsequent parametric perfusion maps were calculated using RAPID software. CONTRAST:  184mL ISOVUE-370 IOPAMIDOL (ISOVUE-370) INJECTION 76% COMPARISON:  09/08/2017 CT head. FINDINGS: CTA NECK FINDINGS Aortic arch: Standard branching. Imaged portion shows no evidence of aneurysm or dissection. No significant stenosis of the major arch vessel origins. Moderate calcific atherosclerosis of aortic arch. Right carotid system: No evidence of dissection, stenosis (50% or greater) or occlusion. Non stenotic calcific atherosclerosis of the carotid bifurcation. Left carotid system: No evidence of dissection, stenosis (50% or greater) or occlusion. Non stenotic calcific atherosclerosis of the carotid bifurcation. Vertebral arteries: Left dominant. No evidence of dissection, stenosis (50% or greater) or occlusion. Skeleton: Moderate cervical spondylosis with multilevel disc and facet degenerative changes. No high-grade bony canal stenosis. Other neck: Left submandibular gland atrophy. Upper chest: Negative. Review of the MIP images confirms the above findings CTA HEAD FINDINGS Anterior circulation:  5 x 4 x 4 mm anterior communicating artery aneurysm (series 11, image 47 and series 14, image 123). Calcific atherosclerosis of the carotid siphons with mild bilateral paraclinoid stenosis. Posterior circulation: No significant stenosis, proximal occlusion, aneurysm, or vascular malformation. Calcific atherosclerosis of vertebral arteries of mild right V4 stenosis. Venous sinuses: As permitted by contrast timing, patent. Anatomic  variants: Patent anterior communicating artery. No posterior communicating artery identified, likely hypoplastic or absent. Review of the MIP images confirms the above findings CT Brain Perfusion Findings: CBF (<30%) Volume: 75mL Perfusion (Tmax>6.0s) volume: 36mL Mismatch Volume: NA. Infarction Location:The AIF and VOF curves had a slow down slope and did not reach equilibrium within scan time. Quantification may be mildly inaccurate, however, there is no asymmetric perfusion abnormality to indicate infarction. IMPRESSION: 1. Patent carotid and vertebral arteries. No dissection, aneurysm, or hemodynamically significant stenosis utilizing NASCET criteria. 2. Patent anterior and posterior intracranial circulation. No large vessel occlusion or high-grade stenosis. 3. 5 mm anterior communicating artery aneurysm. 4. No evidence for focal acute stroke on perfusion imaging. These results were called by telephone at the time of interpretation on 09/14/2017 at 5:00 am to Dr. Amie Portland , who verbally acknowledged these results. Electronically Signed   By: Kristine Garbe M.D.   On: 09/14/2017 05:10   Ct Head Code Stroke Wo Contrast  Result Date: 09/14/2017 CLINICAL DATA:  Code stroke. 84 y/o M; right-sided weakness, facial droop, slurred speech. EXAM: CT HEAD WITHOUT CONTRAST TECHNIQUE: Contiguous axial images were obtained from the base of the skull through the vertex without intravenous contrast. COMPARISON:  None. FINDINGS: Brain: No evidence of acute infarction, hemorrhage, hydrocephalus, extra-axial collection or mass lesion/mass effect. Chronic microvascular ischemic changes and parenchymal volume loss of the brain. Vascular: Calcific atherosclerosis of vertebral arteries and carotid siphons. No hyperdense vessel identified. Skull: Normal. Negative for fracture or focal lesion. Sinuses/Orbits: No acute finding. Other: None. ASPECTS [facility] Stroke Program Early CT Score) - Ganglionic level infarction  (caudate, lentiform nuclei, internal capsule, insula, M1-M3 cortex): 7 - Supraganglionic infarction (M4-M6 cortex): 3 Total score (0-10 with 10 being normal): 10 IMPRESSION: 1. No acute intracranial abnormality identified. 2. ASPECTS is 10 3. Chronic microvascular ischemic changes and parenchymal volume loss of the brain These results were communicated to Dr. Rory Percy at [place] 5/29/2019by text page via the [facility] messaging system. Electronically Signed   By: Kristine Garbe M.D.   On: 09/14/2017 04:18     Assessment/Plan: Diagnosis: left basal ganglia infarct with right hemiparesis 1. Does the need for close, 24 hr/day medical supervision in concert with the patient's rehab needs make it unreasonable for this patient to be served in a less intensive setting? Yes 2. Co-Morbidities requiring supervision/potential complications: afib, htn, anxiety 3. Due to bladder management, bowel management, safety, skin/wound care, disease management, medication administration and patient education, does the patient require 24 hr/day rehab nursing? Yes 4. Does the patient require coordinated care of a physician, rehab nurse, PT (1-2 hrs/day, 5 days/week), OT (1-2 hrs/day, 5 days/week) and SLP (1-2 hrs/day, 5 days/week) to address physical and functional deficits in the context of the above medical diagnosis(es)? Yes Addressing deficits in the following areas: balance, endurance, locomotion, strength, transferring, bowel/bladder control, bathing, dressing, feeding, grooming, toileting, speech, swallowing and psychosocial support 5. Can the patient actively participate in an intensive therapy program of at least 3 hrs of therapy per day at least 5 days per week? Yes 6. The potential for patient to make measurable gains while on inpatient  rehab is excellent 7. Anticipated functional outcomes upon discharge from inpatient rehab are modified independent  with PT, modified independent with OT, modified independent  with SLP. 8. Estimated rehab length of stay to reach the above functional goals is: 8-14 days 9. Anticipated D/C setting: Home 10. Anticipated post D/C treatments: HH therapy and Outpatient therapy 11. Overall Rehab/Functional Prognosis: excellent  RECOMMENDATIONS: This patient's condition is appropriate for continued rehabilitative care in the following setting: CIR Patient has agreed to participate in recommended program. Yes Note that insurance prior authorization may be required for reimbursement for recommended care.  Comment: Rehab Admissions Coordinator to follow up.  Thanks,  Meredith Staggers, MD, Mellody Drown  I have personally performed a face to face diagnostic evaluation of this patient. Additionally, I have reviewed and concur with the physician assistant's documentation above.      Lavon Paganini Angiulli, PA-C 09/15/2017          Revision History                        Routing History

## 2017-09-19 NOTE — Discharge Instructions (Signed)

## 2017-09-19 NOTE — Progress Notes (Signed)
  Speech Language Pathology Treatment: Dysphagia  Patient Details Name: Walter Bryant MRN: [id number] DOB: 1930/06/02 Today's Date: 09/19/2017 Time: 2119-4174 SLP Time Calculation (min) (ACUTE ONLY): 8 min  Assessment / Plan / Recommendation Clinical Impression  Provided education pt's family regarding results and recommendations from MBSS today, including diet texture upgrade. Reviewed free water protocol rationale and guidelines.  Wife voiced that she is not comfortable giving water at this time, but will share information with children and is comfortable with trained nursing staff administering water as part of [place] free water protocol.  Per family Cortrak to be placed this afternoon for nutritional support.  Encouraged pt to continue PO as well.  SLP to follow during acute stay and at next level of care for swallowing therapy, diet modification and compensatory strategies, po trials, ongoing assessment, education, and repeat instrumental evaluation as warranted.   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 Pt admitted for stroke work up with MRI revealing "1. Acute left basal ganglia infarct. 2. Punctate acute infarcts in the right frontal and posterior. 3. Mild-to-moderate chronic small vessel ischemic disease and moderate cerebral atrophy." Daughter reports recent pneumonia. ED visit noted 08/13/17. Most recent CXR 5/29 shows "Worsening left basilar airspace opacity is concerning for recurrent pneumonia."  Pt has had fluctuating presentation of deficits since with increasing dysarthria and dysphagia.  Diet has changed multiple times given inconsistent clinical presentation.      SLP Plan  Continue with current plan of care       Recommendations  Diet recommendations: Dysphagia 2 (fine chop);Nectar-thick liquid Liquids provided via:  Cup Medication Administration: Whole meds with puree Supervision: Patient able to self feed;Intermittent supervision to cue for compensatory strategies Compensations: Slow rate;Small sips/bites            Oral Care Recommendations: Oral care BID Follow up Recommendations: Inpatient Rehab SLP Visit Diagnosis: Dysphagia, oropharyngeal phase (R13.12) Plan: Continue with current plan of care       [place], [place], [place] 09/19/2017, 2:28 PM

## 2017-09-19 NOTE — Progress Notes (Addendum)
Initial Nutrition Assessment  DOCUMENTATION CODES:   Not applicable  INTERVENTION:  Jevity 1.2 @ 20 ml/hr advancing 10 ml every 4 hours until goal rate of 70 ml met + Prostat once per day; total regimen providing 2116 kcal, 108 g protein, and 1360 ml H2O - meeting 100% of kcal and protein needs.   NUTRITION DIAGNOSIS:   Inadequate oral intake related to poor appetite, dysphagia, decreased appetite as evidenced by per patient/family report, meal completion < 25%.  GOAL:   Patient will meet greater than or equal to 90% of their needs  MONITOR:   PO intake, Supplement acceptance, Diet advancement, I & O's, Skin, TF tolerance, Labs, Weight trends  REASON FOR ASSESSMENT:   Consult Assessment of nutrition requirement/status  ASSESSMENT:   84 y.o. M admitted on 09/14/17 for acute ischemic stroke. PMH of A fib, AAA, HTN, HLD, hx of TGAs, HOH, anxiety/depression, veritgo, degenerative arthritis, barrett's esophagus, BPH, and GERD.   Pt in chair with daughter at bedside. Daughter reports what pt at for lunch was the most he has eaten since PTA; pt ate only a couple of spoonfuls of meal and would only sip on supplements given - pt reports feeling nauseous after eating more food than he has been. Pt appetite and intake has not improved since admission and pt is eating 0-10% of meals/supplements; pt without proper nutrition for 5 days. Before this dietetic intern was able to ask if a feeding tube was mentioned to family, daughter requested cortrak to aide his PO intake while he recovers.   Pt and daughter report no recent weight loss. Pt and daughter report pt eating small breakfast and lunch, and a "good sized dinner"; daughter reports PTA pt had "a healthy appetite".   Discussed with MD the possibility of placing a cortrak; cortrak to be placed 6/3.    Medications reviewed: calcium-vitamin D, pepcid, BB TID, EE BID, MVI, buspar, desyrel, ativan.   Labs reviewed: platelets 131 (L).   Net  I/Os since admit: - 700 ml  NUTRITION - FOCUSED PHYSICAL EXAM:  Suspect depletions due to normal age wasting sarcopenia.     Most Recent Value  Orbital Region  No depletion  Upper Arm Region  Mild depletion  Thoracic and Lumbar Region  No depletion  Buccal Region  No depletion  Temple Region  Mild depletion  Clavicle Bone Region  Mild depletion  Clavicle and Acromion Bone Region  No depletion  Scapular Bone Region  Unable to assess  Dorsal Hand  Moderate depletion  Patellar Region  No depletion  Anterior Thigh Region  Mild depletion  Posterior Calf Region  No depletion  Edema (RD Assessment)  None  Hair  Reviewed  Eyes  Reviewed  Mouth  Reviewed  Skin  Reviewed  Nails  Reviewed       Diet Order:   Diet Order           DIET - DYS 1 Room service appropriate? Yes; Fluid consistency: Nectar Thick  Diet effective now          EDUCATION NEEDS:   Education needs have been addressed  Skin:  Skin Assessment: Reviewed RN Assessment  Last BM:  09/13/17  Height:   Ht Readings from Last 1 Encounters:  09/14/17 6' 2"  (1.88 m)    Weight:   Wt Readings from Last 1 Encounters:  09/14/17 216 lb 0.8 oz (98 kg)   UBW: 214-222 lbs since 2014 %UBW: 100%  Ideal Body Weight:  86.36 kg  BMI:  Body mass index is 27.74 kg/m.  Estimated Nutritional Needs:   Kcal:  2100-2300 kcal  Protein:  105-120 grams  Fluid:  >/= 2.1 L or per MD    Hope Budds, Dietetic Intern

## 2017-09-19 NOTE — Progress Notes (Signed)
Cortrak Tube Team Note:  Consult received to place a Cortrak feeding tube.   A 10 F Cortrak tube was placed in the right nare and secured with a nasal bridle at 70 cm. Per the Cortrak monitor reading the tube tip is gastric.   No x-ray is required. RN may begin using tube.   If the tube becomes dislodged please keep the tube and contact the Cortrak team at [email or web address] (password [email or web address]) for replacement.  If after hours and replacement cannot be delayed, place a NG tube and confirm placement with an abdominal x-ray.    Mariana Single RD, LDN Clinical Nutrition Pager # [phone]

## 2017-09-19 NOTE — Progress Notes (Signed)
Pink colored urine noted in foley. Pt has no s/s of pain or discomfort. Will continue to monitor.

## 2017-09-19 NOTE — Progress Notes (Signed)
Inpatient Rehabilitation-Admissions Coordinator   Spoke with pt and family in the room. Pt wanting to come to CIR today. Noted pt scheduled for MBS today. Will await results prior to pursing admit to CIR today. Call if questions.   Jhonnie Garner, OTR/L  Rehab Admissions Coordinator  [phone] 09/19/2017 10:55 AM

## 2017-09-19 NOTE — Procedures (Signed)
Objective Swallowing Evaluation: Type of Study: MBS-Modified Barium Swallow Study   Patient Details  Name: ABDUL BEIRNE MRN: [id number] Date of Birth: [birth year]  Today's Date: 09/19/2017 Time: SLP Start Time (ACUTE ONLY): 1030 -SLP Stop Time (ACUTE ONLY): 1100  SLP Time Calculation (min) (ACUTE ONLY): 30 min   Past Medical History:  Past Medical History:  Diagnosis Date  . A-fib ([place])   . AAA (abdominal aortic aneurysm) ([place])   . Abnormality of gait 12/20/2013  . Anxiety and depression   . Anxiety and depression   . Arthritis   . Barrett's esophagus   . BPH (benign prostatic hyperplasia)   . Degenerative arthritis   . GERD (gastroesophageal reflux disease)   . History of concussion   . HOH (hard of hearing)   . Hyperlipidemia   . Hypertension   . Left tibial fracture   . Macular degeneration Left  . Polyneuropathy in other diseases classified elsewhere ([place]) 12/20/2013  . Right clavicle fracture   . Spinal stenosis   . Transient global amnesia   . Vertigo    Past Surgical History:  Past Surgical History:  Procedure Laterality Date  . ABDOMINAL AORTIC ANEURYSM REPAIR  2016   stent placement  . APPENDECTOMY    . CATARACT EXTRACTION     Bilateral  . KNEE SURGERY Bilateral    both  knees replaced  . LAPAROSCOPIC RIGHT HEMI COLECTOMY    . LUMBAR LAMINECTOMY     L4   . REFRACTIVE SURGERY Left    macular degeneration  . TONSILLECTOMY AND ADENOIDECTOMY    . TOTAL HIP ARTHROPLASTY Right    x 2  . 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 Pt admitted for stroke work up with MRI revealing "1. Acute left basal ganglia infarct. 2. Punctate acute infarcts in the right frontal and posterior. 3. Mild-to-moderate chronic small vessel ischemic disease and moderate cerebral atrophy." Daughter reports  recent pneumonia. ED visit noted 08/13/17. Most recent CXR 5/29 shows "Worsening left basilar airspace opacity is concerning for recurrent pneumonia."  Pt has had fluctuating presentation of deficits since with increasing dysarthria and dysphagia.  Diet has changed multiple times given inconsistent clinical presentation.   Subjective: Pt awake, alert    Assessment / Plan / Recommendation  CHL IP CLINICAL IMPRESSIONS 09/19/2017  Clinical Impression Pt presents with moderate oropharyngeal dysphagia c/b decreased labial seal, reduced lingual strength and coordination, premature spillage, delayed swallow initiation, and incomplete laryngeal closure.  These deficits resulted in anterior loss of liquid, poor bolus formation and transit, and audible aspiration of thin and nectar thick liquids. Reflexive cough response was insufficient to clear aspiration.  Small cup sips prevented aspiration of nectar thick liquid.  Small sips did not prevent aspiration of thin liquid.  Chin tuck prevented aspiration of thin liquids, but static penetration was still noted. There was no penetration or aspiraiton of puree, solid, or honey thick liquid trials. Pt was unable to masticate regular solid texture.  Pt achieved adequate oral clearance of puree and simulate ground textures. Recommend ground diet (dysphagia 2) with nectar thick liquid by cup only.  SLP Visit Diagnosis Dysphagia, oropharyngeal phase (R13.12)  Attention and concentration deficit following --  Frontal lobe and executive function deficit following --  Impact on safety and function Moderate aspiration risk  CHL IP TREATMENT RECOMMENDATION 09/19/2017  Treatment Recommendations F/U MBS in 3-5 days (Comment);Therapy as outlined in treatment plan below     Prognosis 09/19/2017  Prognosis for Safe Diet Advancement Good  Barriers to Reach Goals --  Barriers/Prognosis Comment --    CHL IP DIET RECOMMENDATION 09/19/2017  SLP Diet Recommendations Free water  protocol after oral care;Dysphagia 2 (Fine chop) solids;Nectar thick liquid  Liquid Administration via Cup  Medication Administration Crushed with puree  Compensations Slow rate;Small sips/bites  Postural Changes --      CHL IP OTHER RECOMMENDATIONS 09/19/2017  Recommended Consults --  Oral Care Recommendations Oral care BID  Other Recommendations Prohibited food (jello, ice cream, thin soups);Remove water pitcher;Order thickener from pharmacy      CHL IP FOLLOW UP RECOMMENDATIONS 09/19/2017  Follow up Recommendations (No Data)      CHL IP FREQUENCY AND DURATION 09/19/2017  Speech Therapy Frequency (ACUTE ONLY) min 2x/week  Treatment Duration 2 weeks           CHL IP ORAL PHASE 09/19/2017  Oral Phase Impaired  Oral - Honey Cup Lingual pumping;Weak lingual manipulation;Holding of bolus;Delayed oral transit  Oral - Nectar Cup Premature spillage;Weak lingual manipulation;Lingual pumping;Holding of bolus;Left anterior bolus loss;Right anterior bolus loss  Oral - Thin Cup Right anterior bolus loss;Left anterior bolus loss;Weak lingual manipulation;Lingual pumping;Holding of bolus;Premature spillage  Oral - Mech Soft Impaired mastication;Weak lingual manipulation;Lingual pumping;Holding of bolus  Oral - Regular Impaired mastication    CHL IP PHARYNGEAL PHASE 09/19/2017  Pharyngeal Phase Impaired  Pharyngeal- Honey Cup [facility]   Material does not enter airway  Pharyngeal- Nectar Cup Delayed swallow initiation-pyriform sinuses;Reduced airway/laryngeal closure;Penetration/Aspiration before swallow;Trace aspiration   Material enters airway, passes BELOW cords and not ejected out despite cough attempt by patient;Material enters airway, remains ABOVE vocal cords then ejected out  Pharyngeal- Thin Cup Delayed swallow initiation-pyriform sinuses;Reduced airway/laryngeal closure;Penetration/Aspiration before swallow;Moderate aspiration   Material enters airway, passes BELOW cords and not ejected out  despite cough attempt by patient  Pharyngeal- Puree [facility]   Material does not enter airway  Pharyngeal- Mechanical Soft WFL   Material does not enter airway  Pharyngeal- Regular NT   Removed prior to swallow 2/2 oral deficits     CHL IP CERVICAL ESOPHAGEAL PHASE 09/19/2017  Cervical Esophageal Phase [facility]    No flowsheet data found.  Celedonio Savage, MA, CCC-SLP 09/19/2017, 1:59 PM

## 2017-09-19 NOTE — Progress Notes (Signed)
Physical Therapy Treatment Patient Details Name: Walter Bryant MRN: [id number] DOB: [birth year] Today's Date: 09/19/2017    History of Present Illness Pt is an 84 y/o male who presents with RUE weakness and slurred speech. MRI revealed an acute L basilar infarct, as well as R frontal, posterior and occipital lobe infarcts. PMH significant for vertigo, R clavicle fx, polyneuropathy, macular degeneration, L tibial fx, HTN, HOH, Barrett's esophagus, a-fib, AAA.     PT Comments    Patient required max A +2 for lateral scoot transfer EOB to recliner. Family present during session. Continue to progress as tolerated.    Follow Up Recommendations  CIR;Supervision/Assistance - 24 hour     Equipment Recommendations  Other (comment)(TBD by next venue of care)    Recommendations for Other Services Rehab consult     Precautions / Restrictions Precautions Precautions: Fall Restrictions Weight Bearing Restrictions: No    Mobility  Bed Mobility Overal bed mobility: Needs Assistance Bed Mobility: Supine to Sit     Supine to sit: Max assist;+2 for physical assistance;+2 for safety/equipment;HOB elevated     General bed mobility comments: assist to bring R LE to EOB and to elevate trunk into sitting; pt assisted with use of bed rail; cues for sequencing and increased time   Transfers Overall transfer level: Needs assistance   Transfers: Lateral/Scoot Transfers;Sit to/from Stand Sit to Stand: Max assist;+2 physical assistance;From elevated surface        Lateral/Scoot Transfers: +2 physical assistance;Max assist General transfer comment: STS attempt with max A +2 but unable to get buttocks fully from surface; +2 for lateral scoot to chair with bed pad   Ambulation/Gait                 Stairs             Wheelchair Mobility    Modified Rankin (Stroke Patients Only) Modified Rankin (Stroke Patients Only) Pre-Morbid Rankin Score: No significant disability Modified  Rankin: Severe disability     Balance Overall balance assessment: Needs assistance Sitting-balance support: Feet supported;Single extremity supported Sitting balance-Leahy Scale: Poor   Postural control: Right lateral lean                                  Cognition Arousal/Alertness: Awake/alert Behavior During Therapy: WFL for tasks assessed/performed Overall Cognitive Status: Impaired/Different from baseline Area of Impairment: Attention;Memory;Following commands;Safety/judgement;Awareness;Problem solving                   Current Attention Level: Sustained Memory: Decreased short-term memory Following Commands: Follows one step commands inconsistently;Follows one step commands with increased time Safety/Judgement: Decreased awareness of safety;Decreased awareness of deficits Awareness: Intellectual Problem Solving: Difficulty sequencing;Requires verbal cues;Slow processing        Exercises      General Comments        Pertinent Vitals/Pain Pain Assessment: Faces Faces Pain Scale: No hurt    Home Living                      Prior Function            PT Goals (current goals can now be found in the care plan section) Acute Rehab PT Goals Patient Stated Goal: Return to PLOF PT Goal Formulation: With patient/family Time For Goal Achievement: 09/28/17 Potential to Achieve Goals: Good Progress towards PT goals: Progressing toward goals    Frequency  Min 4X/week      PT Plan Current plan remains appropriate    Co-evaluation              AM-PAC PT "6 Clicks" Daily Activity  Outcome Measure  Difficulty turning over in bed (including adjusting bedclothes, sheets and blankets)?: Unable Difficulty moving from lying on back to sitting on the side of the bed? : Unable Difficulty sitting down on and standing up from a chair with arms (e.g., wheelchair, bedside commode, etc,.)?: Unable Help needed moving to and from a bed  to chair (including a wheelchair)?: A Lot Help needed walking in hospital room?: Total Help needed climbing 3-5 steps with a railing? : Total 6 Click Score: 7    End of Session Equipment Utilized During Treatment: Gait belt Activity Tolerance: Patient tolerated treatment well Patient left: with call bell/phone within reach;in chair;with chair alarm set;with family/visitor present Nurse Communication: Mobility status PT Visit Diagnosis: Unsteadiness on feet (R26.81);Hemiplegia and hemiparesis;Other symptoms and signs involving the nervous system (R29.898) Hemiplegia - Right/Left: Right Hemiplegia - dominant/non-dominant: Dominant Hemiplegia - caused by: Cerebral infarction     Time: 1134-1200 PT Time Calculation (min) (ACUTE ONLY): 26 min  Charges:  $Therapeutic Activity: 23-37 mins                    G Codes:       Earney Navy, PTA Pager: [phone]     Darliss Cheney 09/19/2017, 4:57 PM
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
## 2017-09-19 NOTE — Progress Notes (Signed)
Inpatient Rehabilitation-Admissions Coordinator   Received Medical approval and bed available for transfer to CIR today. Please call if questions.   Jhonnie Garner, OTR/L  Rehab Admissions Coordinator  [phone] 09/19/2017 3:39 PM

## 2017-09-20 ENCOUNTER — Other Ambulatory Visit: Payer: Self-pay

## 2017-09-20 ENCOUNTER — Inpatient Hospital Stay (HOSPITAL_COMMUNITY): Payer: Medicare Other

## 2017-09-20 ENCOUNTER — Inpatient Hospital Stay (HOSPITAL_COMMUNITY): Payer: Medicare Other | Admitting: Occupational Therapy

## 2017-09-20 DIAGNOSIS — I639 Cerebral infarction, unspecified: Secondary | ICD-10-CM

## 2017-09-20 DIAGNOSIS — I69351 Hemiplegia and hemiparesis following cerebral infarction affecting right dominant side: Principal | ICD-10-CM

## 2017-09-20 DIAGNOSIS — I482 Chronic atrial fibrillation: Secondary | ICD-10-CM

## 2017-09-20 DIAGNOSIS — I69391 Dysphagia following cerebral infarction: Secondary | ICD-10-CM

## 2017-09-20 LAB — CBC WITH DIFFERENTIAL/PLATELET
Abs Immature Granulocytes: 0 10*3/uL (ref 0.0–0.1)
BASOS PCT: 1 %
Basophils Absolute: 0.1 10*3/uL (ref 0.0–0.1)
EOS ABS: 0.2 10*3/uL (ref 0.0–0.7)
EOS PCT: 2 %
HEMATOCRIT: 44 % (ref 39.0–52.0)
Hemoglobin: 15.1 g/dL (ref 13.0–17.0)
Immature Granulocytes: 0 %
Lymphocytes Relative: 11 %
Lymphs Abs: 0.8 10*3/uL (ref 0.7–4.0)
MCH: 31.5 pg (ref 26.0–34.0)
MCHC: 34.3 g/dL (ref 30.0–36.0)
MCV: 91.7 fL (ref 78.0–100.0)
Monocytes Absolute: 0.8 10*3/uL (ref 0.1–1.0)
Monocytes Relative: 11 %
NEUTROS PCT: 75 %
Neutro Abs: 5.6 10*3/uL (ref 1.7–7.7)
PLATELETS: 159 10*3/uL (ref 150–400)
RBC: 4.8 MIL/uL (ref 4.22–5.81)
RDW: 14 % (ref 11.5–15.5)
WBC: 7.5 10*3/uL (ref 4.0–10.5)

## 2017-09-20 LAB — COMPREHENSIVE METABOLIC PANEL
ALBUMIN: 3.1 g/dL — AB (ref 3.5–5.0)
ALT: 54 U/L (ref 17–63)
ANION GAP: 10 (ref 5–15)
AST: 40 U/L (ref 15–41)
Alkaline Phosphatase: 67 U/L (ref 38–126)
BILIRUBIN TOTAL: 1.9 mg/dL — AB (ref 0.3–1.2)
BUN: 16 mg/dL (ref 6–20)
CHLORIDE: 105 mmol/L (ref 101–111)
CO2: 23 mmol/L (ref 22–32)
Calcium: 9.3 mg/dL (ref 8.9–10.3)
Creatinine, Ser: 0.88 mg/dL (ref 0.61–1.24)
GFR calc Af Amer: >60 mL/min (ref >60)
GLUCOSE: 118 mg/dL — AB (ref 65–99)
Potassium: 3.8 mmol/L (ref 3.5–5.1)
Sodium: 138 mmol/L (ref 135–145)
TOTAL PROTEIN: 5.6 g/dL — AB (ref 6.5–8.1)

## 2017-09-20 MED ORDER — PRO-STAT SUGAR FREE PO LIQD
30.0000 mL | Freq: Two times a day (BID) | ORAL | Status: DC
  Administered 2017-09-20 – 2017-09-22 (×5): 30 mL
  Filled 2017-09-20 (×5): qty 30

## 2017-09-20 MED ORDER — FREE WATER
200.0000 mL | Freq: Three times a day (TID) | Status: DC
  Administered 2017-09-20 – 2017-10-06 (×48): 200 mL

## 2017-09-20 MED ORDER — JEVITY 1.5 CAL/FIBER PO LIQD
1000.0000 mL | ORAL | Status: DC
Start: 2017-09-20 — End: 2017-09-22
  Administered 2017-09-20 – 2017-09-21 (×2): 1000 mL
  Filled 2017-09-20 (×6): qty 1000

## 2017-09-20 MED ORDER — ENSURE ENLIVE PO LIQD
237.0000 mL | Freq: Three times a day (TID) | ORAL | Status: DC
  Administered 2017-09-21 – 2017-10-10 (×44): 237 mL via ORAL

## 2017-09-20 NOTE — Progress Notes (Signed)
Social Work Assessment and Plan  Patient Details  Name: Walter Bryant MRN: [id number] Date of Birth: 09/27/1930  Today's Date: 09/20/2017  Problem List:  Patient Active Problem List   Diagnosis Date Noted  . Basal ganglia stroke ([place]) 09/19/2017  . Dysphagia, post-stroke   . PAF (paroxysmal atrial fibrillation) ([place])   . Hyperlipidemia   . Goals of care, counseling/discussion   . Hiccoughs   . Palliative care by specialist   . Acute ischemic stroke ([place]) 09/14/2017  . HTN (hypertension) 09/14/2017  . A-fib ([place]) 09/14/2017  . Hyponatremia 08/17/2017  . Fluid overload 08/16/2017  . Anxiety state 08/16/2017  . CAP (community acquired pneumonia) 08/15/2017  . Polyneuropathy in other diseases classified elsewhere ([place]) 12/20/2013  . Abnormality of gait 12/20/2013  . Non-healing right groin open wound 04/09/2013   Past Medical History:  Past Medical History:  Diagnosis Date  . A-fib ([place])   . AAA (abdominal aortic aneurysm) ([place])   . Abnormality of gait 12/20/2013  . Anxiety and depression   . Anxiety and depression   . Arthritis   . Barrett's esophagus   . BPH (benign prostatic hyperplasia)   . Degenerative arthritis   . GERD (gastroesophageal reflux disease)   . History of concussion   . HOH (hard of hearing)   . Hyperlipidemia   . Hypertension   . Left tibial fracture   . Macular degeneration Left  . Polyneuropathy in other diseases classified elsewhere ([place]) 12/20/2013  . Right clavicle fracture   . Spinal stenosis   . Transient global amnesia   . Vertigo    Past Surgical History:  Past Surgical History:  Procedure Laterality Date  . ABDOMINAL AORTIC ANEURYSM REPAIR  2016   stent placement  . APPENDECTOMY    . CATARACT EXTRACTION     Bilateral  . KNEE SURGERY Bilateral    both  knees replaced  . LAPAROSCOPIC RIGHT HEMI COLECTOMY    . LUMBAR LAMINECTOMY     L4   . REFRACTIVE SURGERY Left    macular degeneration  . TONSILLECTOMY AND ADENOIDECTOMY    .  TOTAL HIP ARTHROPLASTY Right    x 2  . WRIST FRACTURE SURGERY Right    Social History:  reports that he quit smoking about 51 years ago. He has never used smokeless tobacco. He reports that he does not drink alcohol or use drugs.  Family / Support Systems Marital Status: Married How Long?: 65 years this month (09-27-17) Patient Roles: Spouse, Parent Spouse/Significant Other: Teague Goynes - wife - [phone] Children: Anderson Malta - local dtr Anticipated Caregiver: wife Ability/Limitations of Caregiver: Physical assistance may be limited to Min A Caregiver Availability: 24/7 Family Dynamics: Close, supportive family, but wife is a little anxious as she does not know what to expect.  CSW to support her and pt both.  Social History Preferred language: English Religion: Catholic Cultural Background: Catholic Education: medical school Read: Yes Write: Yes Employment Status: Retired Date Retired/Disabled/Unemployed: 2000 Age Retired: 68 Public relations account executive Issues: none reported Guardian/Conservator: N/A - MD has determined that pt is capable of making his own decisions.   Abuse/Neglect Abuse/Neglect Assessment Can Be Completed: Yes Physical Abuse: Denies Verbal Abuse: Denies Sexual Abuse: Denies Exploitation of patient/patient's resources: Denies Self-Neglect: Denies  Emotional Status Pt's affect, behavior and adjustment status: Pt smiled a little, at times, but seemed very frustrated that he had this stroke and expressed regrets for not doing things differently, taking different medical steps to prevent this stroke.  Wife tried to encouarge pt to look forward and not back. Recent Psychosocial Issues: Pt with some loss of indepedence over the last couple of years. Psychiatric History: Pt with anxiety and depression.  Will consult neuropsychologist next week. Substance Abuse History: none reported  Patient / Family Perceptions, Expectations & Goals Pt/Family understanding  of illness & functional limitations: Pt/wife have a good understanding of pt's condition and limitaitons. Premorbid pt/family roles/activities: Pt and wife used to enjoy traveling.  Wife is glad they saw the parts of [place] and the [place] they wanted to see, as she does not think they will continue to travel.  Pt also has a "green thumb" and used to enjoy gardening, although his two knee and one hip replacements does not allow for this much now. Anticipated changes in roles/activities/participation: Pt would like to do what he was doing before as he is able. Pt/family expectations/goals: Pt did not verbalize a goal yet, but it was inferred that he wants to get better than he is now and get home.  Community Resources [employer]: Other (Comment)(Pt lives in [place] at [place].  ) Premorbid Home Care/DME Agencies: Other (Comment)(Pt had an aide to assist with bathing due to hx of falls in shower and balance deficits.  Pt has a rollator, shower seat, grab bars, bedside commode, bilateral AFOs.) Transportation available at discharge: wife Resource referrals recommended: Neuropsychology, Support group (specify)(stroke support group)  Discharge Planning Living Arrangements: Spouse/significant other Support Systems: Spouse/significant other, Children, Home care staff, Other (Comment)([facility]) Type of Residence: Other (Comment)([facility]) Insurance Resources: Medicare, Multimedia programmer (specify)([employer]) Financial Resources: Radio broadcast assistant Screen Referred: No Money Management: Spouse, Patient Does the patient have any problems obtaining your medications?: No Home Management: Wife and facility take care of inside and outside of home. Patient/Family Preliminary Plans: Pt/wife unsure of plan at this stage.  Wife wants to see how pt progresses and the outcome of team conference tomorrow and then talk with CSW about their  options. Social Work Anticipated Follow Up Needs: HH/OP, SNF, Support Group, ALF/IL Expected length of stay: 21 to 25 days  Clinical Impression CSW met with pt and his wife to introduce self and role of CSW, as well as to complete assessment.  Pt was able to communicate to CSW that he wishes he had made different medical decisions so that he hadn't had a stroke.  Pt's wife was able to encourage pt to look forward and not dwell on what he didn't/couldn't do prior to the stroke.  Pt lives in independent cottage at [place] with his wife and has access to different levels of care, as needed.  Pt's dtr, Anderson Malta, lives here and wants to be involved in team conference updates tomorrow.  Wife is able to assist pt 24/7 with supervision and possibly min A, although pt had an aide already for bathing.  CSW will refer pt for neuropsychology next week, as well.  CSW will update pt and wife in the room after team conference and dtr via speaker phone.  No current concerns/questions/needs at this time.  CSW will continue to follow and assist as needed.  Elvia Aydin, Silvestre Mesi 09/20/2017, 12:49 PM

## 2017-09-20 NOTE — Discharge Instructions (Signed)
Inpatient Rehab Discharge Instructions  Walter Bryant [facility] Discharge date and time: No discharge date for patient encounter.   Activities/Precautions/ Functional Status: Activity: activity as tolerated Diet:  Wound Care: none needed Functional status:  ___ No restrictions     ___ Walk up steps independently ___ 24/7 supervision/assistance   ___ Walk up steps with assistance ___ Intermittent supervision/assistance  ___ Bathe/dress independently ___ Walk with walker     _x STROKE/TIA DISCHARGE INSTRUCTIONS SMOKING Cigarette smoking nearly doubles your risk of having a stroke & is the single most alterable risk factor  If you smoke or have smoked in the last 12 months, you are advised to quit smoking for your health.  Most of the excess cardiovascular risk related to smoking disappears within a year of stopping.  Ask you doctor about anti-smoking medications  [place] Quit Line: 1-800-QUIT NOW  Free Smoking Cessation Classes [phone]  CHOLESTEROL Know your levels; limit fat & cholesterol in your diet  Lipid Panel     Component Value Date/Time   CHOL 119 09/15/2017 0631   TRIG 42 09/15/2017 0631   HDL 44 09/15/2017 0631   CHOLHDL 2.7 09/15/2017 0631   VLDL 8 09/15/2017 0631   LDLCALC 67 09/15/2017 0631      Many patients benefit from treatment even if their cholesterol is at goal.  Goal: Total Cholesterol (CHOL) less than 160  Goal:  Triglycerides (TRIG) less than 150  Goal:  HDL greater than 40  Goal:  LDL (LDLCALC) less than 100   BLOOD PRESSURE American Stroke Association blood pressure target is less that 120/80 mm/Hg  Your discharge blood pressure is:  BP: (!) 146/89  Monitor your blood pressure  Limit your salt and alcohol intake  Many individuals will require more than one medication for high blood pressure  DIABETES (A1c is a blood sugar average for last 3 months) Goal HGBA1c is under 7% (HBGA1c is blood sugar average for last 3 months)  Diabetes: No known  diagnosis of diabetes    Lab Results  Component Value Date   HGBA1C 5.2 09/15/2017     Your HGBA1c can be lowered with medications, healthy diet, and exercise.  Check your blood sugar as directed by your physician  Call your physician if you experience unexplained or low blood sugars.  PHYSICAL ACTIVITY/REHABILITATION Goal is 30 minutes at least 4 days per week  Activity: Increase activity slowly, Therapies: Physical Therapy: Home Health Return to work:   Activity decreases your risk of heart attack and stroke and makes your heart stronger.  It helps control your weight and blood pressure; helps you relax and can improve your mood.  Participate in a regular exercise program.  Talk with your doctor about the best form of exercise for you (dancing, walking, swimming, cycling).  DIET/WEIGHT Goal is to maintain a healthy weight  Your discharge diet is:  Diet Order           DIET DYS 2 Room service appropriate? Yes; Fluid consistency: Nectar Thick  Diet effective now          liquids Your height is:    Your current weight is:   Your Body Mass Index (BMI) is:     Following the type of diet specifically designed for you will help prevent another stroke.  Your goal weight range is:    Your goal Body Mass Index (BMI) is 19-24.  Healthy food habits can help reduce 3 risk factors for stroke:  High cholesterol, hypertension, and  excess weight.  RESOURCES Stroke/Support Group:  Call [phone]   STROKE EDUCATION PROVIDED/REVIEWED AND GIVEN TO PATIENT Stroke warning signs and symptoms How to activate emergency medical system (call 911). Medications prescribed at discharge. Need for follow-up after discharge. Personal risk factors for stroke. Pneumonia vaccine given:  Flu vaccine given:  My questions have been answered, the writing is legible, and I understand these instructions.  I will adhere to these goals & educational materials that have been provided to me after my discharge  from the hospital.   __ Bathe/dress with assistance ___ Walk Independently    ___ Shower independently ___ Walk with assistance    ___ Shower with assistance ___ No alcohol     ___ Return to work/school ________  Special Instructions:    My questions have been answered and I understand these instructions. I will adhere to these goals and the provided educational materials after my discharge from the hospital.  Patient/Caregiver Signature _______________________________ Date __________  Clinician Signature _______________________________________ Date __________  Please bring this form and your medication list with you to all your follow-up doctor's appointments.   Information on my medicine - ELIQUIS (apixaban)  This medication education was reviewed with me or my healthcare representative as part of my discharge preparation.  Why was Eliquis prescribed for you? Eliquis was prescribed for you to reduce the risk of a blood clot forming that can cause a stroke if you have a medical condition called atrial fibrillation (a type of irregular heartbeat).  What do You need to know about Eliquis ? Take your Eliquis TWICE DAILY - one tablet in the morning and one tablet in the evening with or without food. If you have difficulty swallowing the tablet whole please discuss with your pharmacist how to take the medication safely.  Take Eliquis exactly as prescribed by your doctor and DO NOT stop taking Eliquis without talking to the doctor who prescribed the medication.  Stopping may increase your risk of developing a stroke.  Refill your prescription before you run out.  After discharge, you should have regular check-up appointments with your healthcare provider that is prescribing your Eliquis.  In the future your dose may need to be changed if your kidney function or weight changes by a significant amount or as you get older.  What do you do if you miss a dose? If you miss a dose, take  it as soon as you remember on the same day and resume taking twice daily.  Do not take more than one dose of ELIQUIS at the same time to make up a missed dose.  Important Safety Information A possible side effect of Eliquis is bleeding. You should call your healthcare provider right away if you experience any of the following: ? Bleeding from an injury or your nose that does not stop. ? Unusual colored urine (red or dark brown) or unusual colored stools (red or black). ? Unusual bruising for unknown reasons. ? A serious fall or if you hit your head (even if there is no bleeding).  Some medicines may interact with Eliquis and might increase your risk of bleeding or clotting while on Eliquis. To help avoid this, consult your healthcare provider or pharmacist prior to using any new prescription or non-prescription medications, including herbals, vitamins, non-steroidal anti-inflammatory drugs (NSAIDs) and supplements.  This website has more information on Eliquis (apixaban): http://[email or web address].com/eliquis/home
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
## 2017-09-20 NOTE — Progress Notes (Signed)
Patient information reviewed and entered into eRehab system by Chais Fehringer, RN, CRRN, PPS Coordinator.  Information including medical coding and functional independence measure will be reviewed and updated through discharge.     Per nursing patient was given "Data Collection Information Summary for Patients in Inpatient Rehabilitation Facilities with attached "Privacy Act Statement-Health Care Records" upon admission.  
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
## 2017-09-20 NOTE — Progress Notes (Signed)
Subjective/Complaints:  Discussed nutrition with pt and family and OT.  Ate minimal amt breakfast No pains, OT notes poor sitting balance   ROS- No CP, SOB, N/V/D Objective: Vital Signs: Blood pressure (!) 146/89, pulse 85, temperature 98.9 F (37.2 C), temperature source Oral, resp. rate 18, SpO2 94 %. Dg Swallowing Func-speech Pathology  Result Date: 09/19/2017 Objective Swallowing Evaluation: Type of Study: MBS-Modified Barium Swallow Study  Patient Details Name: Walter Bryant MRN: [id number] Date of Birth: June 15, 1930 Today's Date: 09/19/2017 Time: SLP Start Time (ACUTE ONLY): 1403 -SLP Stop Time (ACUTE ONLY): 1411 SLP Time Calculation (min) (ACUTE ONLY): 8 min Past Medical History: Past Medical History: Diagnosis Date . A-fib ([place])  . AAA (abdominal aortic aneurysm) ([place])  . Abnormality of gait 12/20/2013 . Anxiety and depression  . Anxiety and depression  . Arthritis  . Barrett's esophagus  . BPH (benign prostatic hyperplasia)  . Degenerative arthritis  . GERD (gastroesophageal reflux disease)  . History of concussion  . HOH (hard of hearing)  . Hyperlipidemia  . Hypertension  . Left tibial fracture  . Macular degeneration Left . Polyneuropathy in other diseases classified elsewhere ([place]) 12/20/2013 . Right clavicle fracture  . Spinal stenosis  . Transient global amnesia  . Vertigo  Past Surgical History: Past Surgical History: Procedure Laterality Date . ABDOMINAL AORTIC ANEURYSM REPAIR  2016  stent placement . APPENDECTOMY   . CATARACT EXTRACTION    Bilateral . KNEE SURGERY Bilateral   both  knees replaced . LAPAROSCOPIC RIGHT HEMI COLECTOMY   . LUMBAR LAMINECTOMY    L4  . REFRACTIVE SURGERY Left   macular degeneration . TONSILLECTOMY AND ADENOIDECTOMY   . TOTAL HIP ARTHROPLASTY Right   x 2 . 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 Pt admitted for stroke work up with MRI revealing "1. Acute left basal ganglia infarct. 2. Punctate acute infarcts in the right frontal and posterior. 3. Mild-to-moderate chronic small vessel ischemic disease and moderate cerebral atrophy." Daughter reports recent pneumonia. ED visit noted 08/13/17. Most recent CXR 5/29 shows "Worsening left basilar airspace opacity is concerning for recurrent pneumonia."  Pt has had fluctuating presentation of deficits since with increasing dysarthria and dysphagia.  Diet has changed multiple times given inconsistent clinical presentation.  Subjective: Pt awake, alert Assessment / Plan / Recommendation CHL IP CLINICAL IMPRESSIONS 09/19/2017 Clinical Impression Pt presents with moderate oropharyngeal dysphagia c/b decreased labial seal, reduced lingual strength and coordination, premature spillage, delayed swallow initiation, and incomplete laryngeal closure.  These deficits resulted in anterior loss of liquid, poor bolus formation and transit, and audible aspiration of thin and nectar thick liquids. Reflexive cough response was insufficient to clear aspiration.  Small cup sips prevented aspiration of nectar thick liquid.  Small sips did not prevent aspiration of thin liquid.  Chin tuck prevented aspiration of thin liquids, but static penetration was still noted. There was no penetration or aspiraiton of puree, solid, or honey thick liquid trials. Pt was unable to masticate regular solid texture.  Pt achieved adequate oral clearance of puree and simulate ground textures. Recommend ground diet (dysphagia 2) with nectar thick liquid by cup only. SLP Visit Diagnosis Dysphagia, oropharyngeal phase (R13.12) Attention and concentration deficit following -- Frontal lobe and executive function deficit following -- Impact on safety and function --   CHL IP TREATMENT RECOMMENDATION 09/19/2017 Treatment  Recommendations F/U MBS in --- days (Comment);Therapy as outlined in treatment plan  below   Prognosis 09/19/2017 Prognosis for Safe Diet Advancement Good Barriers to Reach Goals -- Barriers/Prognosis Comment -- CHL IP DIET RECOMMENDATION 09/19/2017 SLP Diet Recommendations -- Liquid Administration via -- Medication Administration -- Compensations Slow rate;Small sips/bites Postural Changes --   CHL IP OTHER RECOMMENDATIONS 09/19/2017 Recommended Consults -- Oral Care Recommendations Oral care BID Other Recommendations Prohibited food (jello, ice cream, thin soups);Remove water pitcher;Order thickener from pharmacy   CHL IP FOLLOW UP RECOMMENDATIONS 09/19/2017 Follow up Recommendations Inpatient Rehab   CHL IP FREQUENCY AND DURATION 09/19/2017 Speech Therapy Frequency (ACUTE ONLY) min 2x/week Treatment Duration 2 weeks      CHL IP ORAL PHASE 09/19/2017 Oral Phase Impaired Oral - Pudding Teaspoon -- Oral - Pudding Cup -- Oral - Honey Teaspoon -- Oral - Honey Cup Lingual pumping;Weak lingual manipulation;Holding of bolus;Delayed oral transit Oral - Nectar Teaspoon -- Oral - Nectar Cup Premature spillage;Weak lingual manipulation;Lingual pumping;Holding of bolus;Left anterior bolus loss;Right anterior bolus loss Oral - Nectar Straw -- Oral - Thin Teaspoon -- Oral - Thin Cup Right anterior bolus loss;Left anterior bolus loss;Weak lingual manipulation;Lingual pumping;Holding of bolus;Premature spillage Oral - Thin Straw -- Oral - Puree -- Oral - Mech Soft Impaired mastication;Weak lingual manipulation;Lingual pumping;Holding of bolus Oral - Regular Impaired mastication Oral - Multi-Consistency -- Oral - Pill -- Oral Phase - Comment --  CHL IP PHARYNGEAL PHASE 09/19/2017 Pharyngeal Phase Impaired Pharyngeal- Pudding Teaspoon -- Pharyngeal -- Pharyngeal- Pudding Cup -- Pharyngeal -- Pharyngeal- Honey Teaspoon -- Pharyngeal -- Pharyngeal- Honey Cup WFL Pharyngeal -- Pharyngeal- Nectar Teaspoon -- Pharyngeal -- Pharyngeal- Nectar Cup Delayed swallow initiation-pyriform sinuses;Reduced airway/laryngeal  closure;Penetration/Aspiration before swallow;Trace aspiration Pharyngeal Material enters airway, passes BELOW cords and not ejected out despite cough attempt by patient;Material enters airway, remains ABOVE vocal cords then ejected out Pharyngeal- Nectar Straw -- Pharyngeal -- Pharyngeal- Thin Teaspoon -- Pharyngeal -- Pharyngeal- Thin Cup Delayed swallow initiation-pyriform sinuses;Reduced airway/laryngeal closure;Penetration/Aspiration before swallow;Moderate aspiration Pharyngeal Material enters airway, passes BELOW cords and not ejected out despite cough attempt by patient Pharyngeal- Thin Straw -- Pharyngeal -- Pharyngeal- Puree WFL Pharyngeal -- Pharyngeal- Mechanical Soft WFL Pharyngeal -- Pharyngeal- Regular NT Pharyngeal -- Pharyngeal- Multi-consistency -- Pharyngeal -- Pharyngeal- Pill -- Pharyngeal -- Pharyngeal Comment --  CHL IP CERVICAL ESOPHAGEAL PHASE 09/19/2017 Cervical Esophageal Phase WFL Pudding Teaspoon -- Pudding Cup -- Honey Teaspoon -- Honey Cup -- Nectar Teaspoon -- Nectar Cup -- Nectar Straw -- Thin Teaspoon -- Thin Cup -- Thin Straw -- Puree -- Mechanical Soft -- Regular -- Multi-consistency -- Pill -- Cervical Esophageal Comment -- No flowsheet data found. Leigh E Borum 09/19/2017, 3:57 PM              Results for orders placed or performed during the hospital encounter of 09/19/17 (from the past 72 hour(s))  CBC WITH DIFFERENTIAL     Status: None   Collection Time: 09/20/17  5:01 AM  Result Value Ref Range   WBC 7.5 4.0 - 10.5 K/uL   RBC 4.80 4.22 - 5.81 MIL/uL   Hemoglobin 15.1 13.0 - 17.0 g/dL   HCT 44.0 39.0 - 52.0 %   MCV 91.7 78.0 - 100.0 fL   MCH 31.5 26.0 - 34.0 pg   MCHC 34.3 30.0 - 36.0 g/dL   RDW 14.0 11.5 - 15.5 %   Platelets 159 150 - 400 K/uL   Neutrophils Relative % 75 %   Neutro Abs 5.6 1.7 -  7.7 K/uL   Lymphocytes Relative 11 %   Lymphs Abs 0.8 0.7 - 4.0 K/uL   Monocytes Relative 11 %   Monocytes Absolute 0.8 0.1 - 1.0 K/uL   Eosinophils Relative 2 %    Eosinophils Absolute 0.2 0.0 - 0.7 K/uL   Basophils Relative 1 %   Basophils Absolute 0.1 0.0 - 0.1 K/uL   Immature Granulocytes 0 %   Abs Immature Granulocytes 0.0 0.0 - 0.1 K/uL    Comment: Performed at [facility] [address], [place], [place] [address]  Comprehensive metabolic panel     Status: Abnormal   Collection Time: 09/20/17  5:01 AM  Result Value Ref Range   Sodium 138 135 - 145 mmol/L   Potassium 3.8 3.5 - 5.1 mmol/L   Chloride 105 101 - 111 mmol/L   CO2 23 22 - 32 mmol/L   Glucose, Bld 118 (H) 65 - 99 mg/dL   BUN 16 6 - 20 mg/dL   Creatinine, Ser 0.88 0.61 - 1.24 mg/dL   Calcium 9.3 8.9 - 10.3 mg/dL   Total Protein 5.6 (L) 6.5 - 8.1 g/dL   Albumin 3.1 (L) 3.5 - 5.0 g/dL   AST 40 15 - 41 U/L   ALT 54 17 - 63 U/L   Alkaline Phosphatase 67 38 - 126 U/L   Total Bilirubin 1.9 (H) 0.3 - 1.2 mg/dL   GFR calc non Af Amer >60 >60 mL/min   GFR calc Af Amer >60 >60 mL/min    Comment: (NOTE) The eGFR has been calculated using the CKD EPI equation. This calculation has not been validated in all clinical situations. eGFR's persistently <60 mL/min signify possible Chronic Kidney Disease.    Anion gap 10 5 - 15    Comment: Performed at [facility] [address], [place], [place] [address]     HEENT: Right facial droop, feeding tube R nares Cardio: RRR and no murmur Resp: CTA B/L and unlabored GI: BS positive and NT, ND Extremity:  No Edema Skin:   Other IV sites and nares CDI Neuro: Alert/Oriented, Abnormal Sensory Reduced bilateral below ankles, Abnormal Motor 0/5 RUE and RLE and Dysarthric Musc/Skel:  Other mild pain with Right shoulder ext rotation, neg impingement sign Gen NAD Left ankle df 4/5  Assessment/Plan: 1. 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 Physiatrist is providing close team supervision and 24 hour  management of active medical problems listed below. Physiatrist and rehab team continue to assess barriers to discharge/monitor patient progress toward functional and medical goals. FIM:                   Function - Comprehension Comprehension: Auditory Comprehension assistive device: Other(impaired hearing ) Comprehension assist level: Understands basic 50 - 74% of the time/ requires cueing 25 - 49% of the time  Function - Expression Expression: Verbal Expression assist level: Expresses basic 50 - 74% of the time/requires cueing 25 - 49% of the time. Needs to repeat parts of sentences.  Function - Social Interaction Social Interaction assist level: Interacts appropriately with others with medication or extra time (anti-anxiety, antidepressant)., Interacts appropriately 90% of the time - Needs monitoring or encouragement for participation or interaction.  Function - Problem Solving Problem solving assist level: Solves basic 50 - 74% of the time/requires cueing 25 - 49% of the time  Function - Memory Memory assistive  device: Other (Comment)(requires tactile cues; slow processing ) Memory assist level: Recognizes or recalls 25 - 49% of the time/requires cueing 50 - 75% of the time Patient normally able to recall (first 3 days only): None of the above(disoriented to place, time, situation )  Medical Problem List and Plan: 1.  Right side weakness with dysarthria secondary to multiple bilateral infarcts consistent with cardiac emboli with history of atrial fibrillation.Onset 5/29 Team conf in am 2.  DVT Prophylaxis/Anticoagulation: Eliquis 3. Pain Management/polyneuropathy: Tylenol as needed 4. Mood: BuSpar 10 mg twice daily, trazodone 50 mg nightly 5. Neuropsych: This patient is capable of making decisions on his own behalf. 6. Skin/Wound Care: Routine skin checks 7. Fluids/Electrolytes/Nutrition: Routine in and outs with follow-up chemistries 8.  Dysphasia.  Dysphasia #2  nectar liquids.Cortrak tube feeds for nutritional support.  Check calorie counts.  Follow-up speech therapy H20 boluses 9.  History of atrial fibrillation.  Continue Eliquis 49m BID.  Cardiac rate controlled, on metoprolol PTA 10.  Hyperlipidemia.  Zocor on lipitor PTA 11.  History of polyneuropathy.  Patient uses bilateral AFO braces prior to admission. 12.  Hx HTN lasix and Lisinopril PTA Vitals:   09/19/17 2115 09/20/17 0409  BP: (!) 145/96 (!) 146/89  Pulse: 68 85  Resp: 18 18  Temp: 98 F (36.7 C) 98.9 F (37.2 C)  SpO2: 96% 94%  may restart later in the week  LOS (Days) 1 A FACE TO FACE EVALUATION WAS PERFORMED  ACharlett Blake6/07/2017, 6:40 AM

## 2017-09-20 NOTE — Progress Notes (Signed)
[facility] Individual Statement of Services  Patient Name:  Walter Bryant [facility]  Date:  09/20/2017  Welcome to the [facility].  Our goal is to provide you with an individualized program based on your diagnosis and situation, designed to meet your specific needs.  With this comprehensive rehabilitation program, you will be expected to participate in at least 3 hours of rehabilitation therapies Monday-Friday, with modified therapy programming on the weekends.  Your rehabilitation program will include the following services:  Physical Therapy (PT), Occupational Therapy (OT), Speech Therapy (ST), 24 hour per day rehabilitation nursing, Neuropsychology, Case Management (Social Worker), Rehabilitation Medicine, Nutrition Services and Pharmacy Services  Weekly team conferences will be held on Wednesdays to discuss your progress.  Your Social Worker will talk with you frequently to get your input and to update you on team discussions.  Team conferences with you and your family in attendance may also be held.  Expected length of stay:  21 to 25 days  Overall anticipated outcome:  Moderate assistance  Depending on your progress and recovery, your program may change. Your Social Worker will coordinate services and will keep you informed of any changes. Your Social Worker's name and contact numbers are listed  below.  The following services may also be recommended but are not provided by the [facility] will be made to provide these services after discharge if needed.  Arrangements include referral to agencies that provide these services.  Your insurance has been verified to be:  Medicare and [employer] Your primary doctor is:  Dr. Domenick Gong  Pertinent information will be shared with your doctor and your insurance  company.  Social Worker:  Alfonse Alpers, LCSW  [phone] or [phone]  Information discussed with and copy given to patient by: Trey Sailors, 09/20/2017, 11:26 AM

## 2017-09-21 ENCOUNTER — Inpatient Hospital Stay (HOSPITAL_COMMUNITY): Payer: Medicare Other | Admitting: Occupational Therapy

## 2017-09-21 ENCOUNTER — Ambulatory Visit (HOSPITAL_COMMUNITY): Payer: Medicare Other

## 2017-09-21 MED ORDER — FAMOTIDINE 20 MG PO TABS
40.0000 mg | ORAL_TABLET | Freq: Two times a day (BID) | ORAL | Status: DC
  Administered 2017-09-21 – 2017-09-30 (×19): 40 mg via ORAL
  Filled 2017-09-21 (×20): qty 2

## 2017-09-21 MED ORDER — MEGESTROL ACETATE 400 MG/10ML PO SUSP
200.0000 mg | Freq: Two times a day (BID) | ORAL | Status: DC
  Administered 2017-09-21 – 2017-09-25 (×9): 200 mg via ORAL
  Filled 2017-09-21 (×9): qty 10

## 2017-09-21 MED ORDER — TAMSULOSIN HCL 0.4 MG PO CAPS
0.4000 mg | ORAL_CAPSULE | Freq: Every day | ORAL | Status: DC
  Administered 2017-09-21 – 2017-09-22 (×2): 0.4 mg via ORAL
  Filled 2017-09-21 (×2): qty 1

## 2017-09-21 NOTE — Progress Notes (Signed)
Occupational Therapy Session Note  Patient Details  Name: Walter Bryant MRN: [id number] Date of Birth: 03/18/1931  Today's Date: 09/21/2017 OT Individual Time: 3154-0086 OT Individual Time Calculation (min): 45 min    Short Term Goals: Week 1:  OT Short Term Goal 1 (Week 1): Pt will perform slide board transfer bed<>chair with max A+2 with initiation of foward weight shift OT Short Term Goal 2 (Week 1): Pt will don shirt with max A  OT Short Term Goal 3 (Week 1): Pt will maintain static sitting balance with min A for ~5 min in prep for functional tasks  OT Short Term Goal 4 (Week 1): Pt will roll in bed to the right with mod A to help with LB clothing management and hygiene  Skilled Therapeutic Interventions/Progress Updates:    Pt seen this session to develop bed mobility and trunk control skills to allow pt to assist with LB self care. Pt received in bed with daughter in the room.  Pt stated he did not receive a bed bath last night.  He already had a shirt on and did not have fresh clothing yet so he opted to keep shirt on.  Pt's HOB elevated to allow him to wash tops of thighs with steadying A and his front perineal area.  Bed positioned flat for pt to work on rolling, he is easily able to roll to R side using LUE with min A to shift leg position. To his L, guided with A to use L hand to support R arm on chest and tactile cues for turning head and shoulders to initiate rolling. New brief and pants donned with rolling. Pt then positioned upright in bed for RUE PROM and scapular gliding. Pt does have trace scapular elevation and retraction.  Education with pt and his daughter on tactile stimulation of R arm and positioning with a pillow. Pt resting in bed with daughter with him.  Therapy Documentation Precautions:  Precautions Precautions: Fall, Other (comment) Precaution Comments: R hemi, lateral lean and pushes to the R; Cortrak Required Braces or Orthoses: Other Brace/Splint Other  Brace/Splint: bilateral upright AFO's (used PTA. Has h/o charcot foot on L) Restrictions Weight Bearing Restrictions: No  Pain: Pain Assessment Pain Score: 0-No pain ADL: ADL ADL Comments: see functional navigator  See Function Navigator for Current Functional Status.   Therapy/Group: Individual Therapy  [place] 09/21/2017, 1:13 PM

## 2017-09-21 NOTE — Progress Notes (Signed)
Day 1 of 2: Calorie Count Note  48 hour calorie count ordered.  Diet: Dysphagia 2 diet with nectar thick liquids Supplements:  Ensure Enlive po TID, each supplement provides 350 kcal and 20 grams of protein.  Breakfast: 172 kcal, 7 grams of protein Lunch: 82 kcal, 6 grams of protein Dinner: 130 kcal, 5 grams of protein Supplements: 80 kcal, 5 grams of protein  Day 1 Total intake: 464 kcal (22% of minimum estimated needs)  23 grams of protein (23% of minimum estimated needs)  Estimated Nutritional Needs:  Kcal:  2100-2300 Protein:  100-115 grams Fluid:  >/= 2.1 L/day  Nutrition Dx:  Inadequate oral intake related to dysphagia, poor appetite as evidenced by meal completion < 50%; ongoing  Goal:  Pt to meet >/= 90% of their estimated nutrition needs; met via TF  Intervention: Continue 48 hour calorie count. RD to follow up tomorrow with final results.   Continue Ensure Enlive po TID (thickened to appropriate consistency), each supplement provides 350 kcal and 20 grams of protein.   Continue nocturnal tube feeds via Cortrak NGT using Jevity 1.5 formula at rate of 85 ml/hr run over 14 hours (6pm-8am) with 30 ml Prostat BID per tube.   Provide free water flushes of 200 ml TID per tube. (MD may adjust as appropriate)  Tube feeding regimen to provide 1985 kcal (95% of kcal needs), 106 grams of protein (100% of protein needs), and 1504 ml water.    Corrin Parker, MS, RD, LDN Pager # [phone] After hours/ weekend pager # [phone]

## 2017-09-21 NOTE — Progress Notes (Signed)
Speech Language Pathology Daily Session Note  Patient Details  Name: [name] MRN: [id number] Date of Birth: 02/12/1931  Today's Date: 09/21/2017 SLP Individual Time: 1300-1400 SLP Individual Time Calculation (min): 60 min  Short Term Goals: Week 1: SLP Short Term Goal 1 (Week 1): Pt will consume thin liquid/ice cups trials via cup sips with min overt s/s aspiration to demonstrate readiness of instrumental swallow assessment. SLP Short Term Goal 2 (Week 1): Pt will consume dys 3 trials with efficient mastication and oral clearnace without overt s/s aspiration to demonstrate readiness of solid upgrade. SLP Short Term Goal 3 (Week 1): Pt will recall new, daily information with Min A verbal cues for external aids. SLP Short Term Goal 4 (Week 1): Pt will demonstrate 90% intelligibility at sentence level with Mod A verbal cues for use of speech intelligibility strategies.  SLP Short Term Goal 4 - Progress (Week 1): Updated due to goal met SLP Short Term Goal 5 (Week 1): Pt will demonstrate functional problem solving in semi-complex tasks with supervision A verbal cues.  Skilled Therapeutic Interventions: Skilled ST services focused on swallow and cognitive skills. SLP facilitated continued assessment of cognitive skills in semi-complex problem solving task utilizing ALFA medication and money management, pt required min A verbal cues for problem solving with ability to self correct once aware of error and mod A verbal cues to recall novel information during task. SLP created memory notebook to aid in recall of daily events, requesting therapist and family write in notebook. Pt demonstrated noted improvement in speech intelligibility and vocal intensity with hearing aids in place, 70% intelligibility with mod A verbal cues, however assessment of RMT in future session is still appropriate due to reduced breath support. SLP facilitated 6 oz thin liquid trials following oral care, pt demonstrated no  overt s/s aspiration with chin tuck and 1x throat clear without chin tuck. Pt was left in room with family and call bell within reach. Reccomend to continue skilled ST services.      Function:  Eating Eating   Modified Consistency Diet: Yes(thin trials ) Eating Assist Level: Supervision or verbal cues           Cognition Comprehension Comprehension assist level: Understands basic 75 - 89% of the time/ requires cueing 10 - 24% of the time  Expression   Expression assist level: Expresses basic 50 - 74% of the time/requires cueing 25 - 49% of the time. Needs to repeat parts of sentences.;Expresses basic 75 - 89% of the time/requires cueing 10 - 24% of the time. Needs helper to occlude trach/needs to repeat words.  Social Interaction Social Interaction assist level: Interacts appropriately 90% of the time - Needs monitoring or encouragement for participation or interaction.  Problem Solving Problem solving assist level: Solves complex 90% of the time/cues < 10% of the time  Memory Memory assist level: Recognizes or recalls 50 - 74% of the time/requires cueing 25 - 49% of the time    Pain Pain Assessment Pain Score: 0-No pain  Therapy/Group: Individual Therapy  Jenniferann Stuckert  [facility] 09/21/2017, 4:15 PM

## 2017-09-21 NOTE — Progress Notes (Signed)
Physical Therapy Session Note  Patient Details  Name: Walter Bryant MRN: [id number] Date of Birth: June 22, 1930  Today's Date: 09/21/2017 PT Individual Time: 1131-1158 PT Individual Time Calculation (min): 27 min   Short Term Goals: Week 1:  PT Short Term Goal 1 (Week 1): Pt will be able to demonstrate reorientation to midline without UE support with mod assist PT Short Term Goal 2 (Week 1): Pt will be able to perform slideboard transfer with +2 assist (pt = 25%) PT Short Term Goal 3 (Week 1): Pt will be able to initiate w/c mobility  Skilled Therapeutic Interventions/Progress Updates:  Pt received in w/c & agreeable to tx. No c/o pain reported. Transported pt around unit via w/c total assist. Pt utilized dynavision from w/c level with activity focusing on L/R attention and pt with about equal reaction times to both sides. After engaging in task for 2 minutes pt reported fatigue "close to 10" when given 0-10 scale. Pt performed RUE exercises with therapist extending R elbow and pt with slight ability to flex elbow from supported position. When asked to perform repeated RLE long arc quads pt with only trace movement noted. Pt reported need for water and returned to room with pt requested thickened liquid versus completing water protocol. At end of session pt left in TIS w/c with family present to supervise and encouragement to consume lunch.   Therapy Documentation Precautions:  Precautions Precautions: Fall, Other (comment) Precaution Comments: R hemi, lateral lean and pushes to the R; Cortrak Required Braces or Orthoses: Other Brace/Splint Other Brace/Splint: bilateral upright AFO's (used PTA. Has h/o charcot foot on L) Restrictions Weight Bearing Restrictions: No   See Function Navigator for Current Functional Status.   Therapy/Group: Individual Therapy  [name] 09/21/2017, 12:09 PM

## 2017-09-21 NOTE — Progress Notes (Addendum)
Subjective/Complaints:  Appreciate dietary consult Discussed appetite with pt and daughter, working with OT Took ranitidine and omeprazole at home   ROS- No CP, SOB, N/V/D Objective: Vital Signs: Blood pressure 138/89, pulse (!) 58, temperature 98 F (36.7 C), temperature source Oral, resp. rate 19, height 6' 2"  (1.88 m), weight 98.1 kg (216 lb 4.3 oz), SpO2 95 %. Dg Swallowing Func-speech Pathology  Result Date: 09/19/2017 Objective Swallowing Evaluation: Type of Study: MBS-Modified Barium Swallow Study  Patient Details Name: Walter Bryant MRN: [id number] Date of Birth: Jun 01, 1930 Today's Date: 09/19/2017 Time: SLP Start Time (ACUTE ONLY): 1403 -SLP Stop Time (ACUTE ONLY): 1411 SLP Time Calculation (min) (ACUTE ONLY): 8 min Past Medical History: Past Medical History: Diagnosis Date . A-fib ([place])  . AAA (abdominal aortic aneurysm) ([place])  . Abnormality of gait 12/20/2013 . Anxiety and depression  . Anxiety and depression  . Arthritis  . Barrett's esophagus  . BPH (benign prostatic hyperplasia)  . Degenerative arthritis  . GERD (gastroesophageal reflux disease)  . History of concussion  . HOH (hard of hearing)  . Hyperlipidemia  . Hypertension  . Left tibial fracture  . Macular degeneration Left . Polyneuropathy in other diseases classified elsewhere ([place]) 12/20/2013 . Right clavicle fracture  . Spinal stenosis  . Transient global amnesia  . Vertigo  Past Surgical History: Past Surgical History: Procedure Laterality Date . ABDOMINAL AORTIC ANEURYSM REPAIR  2016  stent placement . APPENDECTOMY   . CATARACT EXTRACTION    Bilateral . KNEE SURGERY Bilateral   both  knees replaced . LAPAROSCOPIC RIGHT HEMI COLECTOMY   . LUMBAR LAMINECTOMY    L4  . REFRACTIVE SURGERY Left   macular degeneration . TONSILLECTOMY AND ADENOIDECTOMY   . TOTAL HIP ARTHROPLASTY Right   x 2 . 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 Pt admitted for stroke work up with MRI revealing "1. Acute left basal ganglia infarct. 2. Punctate acute infarcts in the right frontal and posterior. 3. Mild-to-moderate chronic small vessel ischemic disease and moderate cerebral atrophy." Daughter reports recent pneumonia. ED visit noted 08/13/17. Most recent CXR 5/29 shows "Worsening left basilar airspace opacity is concerning for recurrent pneumonia."  Pt has had fluctuating presentation of deficits since with increasing dysarthria and dysphagia.  Diet has changed multiple times given inconsistent clinical presentation.  Subjective: Pt awake, alert Assessment / Plan / Recommendation CHL IP CLINICAL IMPRESSIONS 09/19/2017 Clinical Impression Pt presents with moderate oropharyngeal dysphagia c/b decreased labial seal, reduced lingual strength and coordination, premature spillage, delayed swallow initiation, and incomplete laryngeal closure.  These deficits resulted in anterior loss of liquid, poor bolus formation and transit, and audible aspiration of thin and nectar thick liquids. Reflexive cough response was insufficient to clear aspiration.  Small cup sips prevented aspiration of nectar thick liquid.  Small sips did not prevent aspiration of thin liquid.  Chin tuck prevented aspiration of thin liquids, but static penetration was still noted. There was no penetration or aspiraiton of puree, solid, or honey thick liquid trials. Pt was unable to masticate regular solid texture.  Pt achieved adequate oral clearance of puree and simulate ground textures. Recommend ground diet (dysphagia 2) with nectar thick liquid by cup only. SLP Visit Diagnosis Dysphagia, oropharyngeal phase (R13.12) Attention and concentration deficit following -- Frontal lobe and executive function deficit following -- Impact on safety and  function --   CHL IP TREATMENT RECOMMENDATION 09/19/2017 Treatment Recommendations  F/U MBS in --- days (Comment);Therapy as outlined in treatment plan below   Prognosis 09/19/2017 Prognosis for Safe Diet Advancement Good Barriers to Reach Goals -- Barriers/Prognosis Comment -- CHL IP DIET RECOMMENDATION 09/19/2017 SLP Diet Recommendations -- Liquid Administration via -- Medication Administration -- Compensations Slow rate;Small sips/bites Postural Changes --   CHL IP OTHER RECOMMENDATIONS 09/19/2017 Recommended Consults -- Oral Care Recommendations Oral care BID Other Recommendations Prohibited food (jello, ice cream, thin soups);Remove water pitcher;Order thickener from pharmacy   CHL IP FOLLOW UP RECOMMENDATIONS 09/19/2017 Follow up Recommendations Inpatient Rehab   CHL IP FREQUENCY AND DURATION 09/19/2017 Speech Therapy Frequency (ACUTE ONLY) min 2x/week Treatment Duration 2 weeks      CHL IP ORAL PHASE 09/19/2017 Oral Phase Impaired Oral - Pudding Teaspoon -- Oral - Pudding Cup -- Oral - Honey Teaspoon -- Oral - Honey Cup Lingual pumping;Weak lingual manipulation;Holding of bolus;Delayed oral transit Oral - Nectar Teaspoon -- Oral - Nectar Cup Premature spillage;Weak lingual manipulation;Lingual pumping;Holding of bolus;Left anterior bolus loss;Right anterior bolus loss Oral - Nectar Straw -- Oral - Thin Teaspoon -- Oral - Thin Cup Right anterior bolus loss;Left anterior bolus loss;Weak lingual manipulation;Lingual pumping;Holding of bolus;Premature spillage Oral - Thin Straw -- Oral - Puree -- Oral - Mech Soft Impaired mastication;Weak lingual manipulation;Lingual pumping;Holding of bolus Oral - Regular Impaired mastication Oral - Multi-Consistency -- Oral - Pill -- Oral Phase - Comment --  CHL IP PHARYNGEAL PHASE 09/19/2017 Pharyngeal Phase Impaired Pharyngeal- Pudding Teaspoon -- Pharyngeal -- Pharyngeal- Pudding Cup -- Pharyngeal -- Pharyngeal- Honey Teaspoon -- Pharyngeal -- Pharyngeal- Honey Cup WFL Pharyngeal -- Pharyngeal- Nectar Teaspoon -- Pharyngeal -- Pharyngeal- Nectar Cup Delayed swallow  initiation-pyriform sinuses;Reduced airway/laryngeal closure;Penetration/Aspiration before swallow;Trace aspiration Pharyngeal Material enters airway, passes BELOW cords and not ejected out despite cough attempt by patient;Material enters airway, remains ABOVE vocal cords then ejected out Pharyngeal- Nectar Straw -- Pharyngeal -- Pharyngeal- Thin Teaspoon -- Pharyngeal -- Pharyngeal- Thin Cup Delayed swallow initiation-pyriform sinuses;Reduced airway/laryngeal closure;Penetration/Aspiration before swallow;Moderate aspiration Pharyngeal Material enters airway, passes BELOW cords and not ejected out despite cough attempt by patient Pharyngeal- Thin Straw -- Pharyngeal -- Pharyngeal- Puree WFL Pharyngeal -- Pharyngeal- Mechanical Soft WFL Pharyngeal -- Pharyngeal- Regular NT Pharyngeal -- Pharyngeal- Multi-consistency -- Pharyngeal -- Pharyngeal- Pill -- Pharyngeal -- Pharyngeal Comment --  CHL IP CERVICAL ESOPHAGEAL PHASE 09/19/2017 Cervical Esophageal Phase WFL Pudding Teaspoon -- Pudding Cup -- Honey Teaspoon -- Honey Cup -- Nectar Teaspoon -- Nectar Cup -- Nectar Straw -- Thin Teaspoon -- Thin Cup -- Thin Straw -- Puree -- Mechanical Soft -- Regular -- Multi-consistency -- Pill -- Cervical Esophageal Comment -- No flowsheet data found. Leigh E Borum 09/19/2017, 3:57 PM              Results for orders placed or performed during the hospital encounter of 09/19/17 (from the past 72 hour(s))  CBC WITH DIFFERENTIAL     Status: None   Collection Time: 09/20/17  5:01 AM  Result Value Ref Range   WBC 7.5 4.0 - 10.5 K/uL   RBC 4.80 4.22 - 5.81 MIL/uL   Hemoglobin 15.1 13.0 - 17.0 g/dL   HCT 44.0 39.0 - 52.0 %   MCV 91.7 78.0 - 100.0 fL   MCH 31.5 26.0 - 34.0 pg   MCHC 34.3 30.0 - 36.0 g/dL   RDW 14.0 11.5 - 15.5 %   Platelets 159 150 - 400 K/uL   Neutrophils  Relative % 75 %   Neutro Abs 5.6 1.7 - 7.7 K/uL   Lymphocytes Relative 11 %   Lymphs Abs 0.8 0.7 - 4.0 K/uL   Monocytes Relative 11 %   Monocytes  Absolute 0.8 0.1 - 1.0 K/uL   Eosinophils Relative 2 %   Eosinophils Absolute 0.2 0.0 - 0.7 K/uL   Basophils Relative 1 %   Basophils Absolute 0.1 0.0 - 0.1 K/uL   Immature Granulocytes 0 %   Abs Immature Granulocytes 0.0 0.0 - 0.1 K/uL    Comment: Performed at [facility] [address] [place], [place] [address]  Comprehensive metabolic panel     Status: Abnormal   Collection Time: 09/20/17  5:01 AM  Result Value Ref Range   Sodium 138 135 - 145 mmol/L   Potassium 3.8 3.5 - 5.1 mmol/L   Chloride 105 101 - 111 mmol/L   CO2 23 22 - 32 mmol/L   Glucose, Bld 118 (H) 65 - 99 mg/dL   BUN 16 6 - 20 mg/dL   Creatinine, Ser 0.88 0.61 - 1.24 mg/dL   Calcium 9.3 8.9 - 10.3 mg/dL   Total Protein 5.6 (L) 6.5 - 8.1 g/dL   Albumin 3.1 (L) 3.5 - 5.0 g/dL   AST 40 15 - 41 U/L   ALT 54 17 - 63 U/L   Alkaline Phosphatase 67 38 - 126 U/L   Total Bilirubin 1.9 (H) 0.3 - 1.2 mg/dL   GFR calc non Af Amer >60 >60 mL/min   GFR calc Af Amer >60 >60 mL/min    Comment: (NOTE) The eGFR has been calculated using the CKD EPI equation. This calculation has not been validated in all clinical situations. eGFR's persistently <60 mL/min signify possible Chronic Kidney Disease.    Anion gap 10 5 - 15    Comment: Performed at [facility] [address] [place], [place] [address]     HEENT: Right facial droop, feeding tube R nares Cardio: RRR and no murmur Resp: CTA B/L and unlabored GI: BS positive and NT, ND Extremity:  No Edema Skin:   Other IV sites and nares CDI Neuro: Alert/Oriented, Abnormal Sensory Reduced bilateral below ankles, Abnormal Motor 0/5 RUE and RLE and Dysarthric Musc/Skel:  Other mild pain with Right shoulder ext rotation, neg impingement sign Gen NAD Left ankle df 4/5  Assessment/Plan: 1. 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 Physiatrist is providing close team supervision and 24 hour management of active medical problems listed below. Physiatrist and rehab team continue to assess barriers to discharge/monitor patient progress toward functional and medical goals. FIM:    Function- Upper Body Dressing/Undressing What is the patient wearing?: Pull over shirt/dress Pull over shirt/dress - Perfomed by helper: Thread/unthread right sleeve, Thread/unthread left sleeve, Put head through opening, Pull shirt over trunk Assist Level: Touching or steadying assistance(Pt > 75%) Function - Lower Body Dressing/Undressing What is the patient wearing?: Pants, Socks, Shoes, AFO, Ted Hose Position: Wheelchair/chair at [employer]- Performed by helper: Thread/unthread right pants leg, Thread/unthread left pants leg, Pull pants up/down Socks - Performed by helper: Don/doff right sock, Don/doff left sock Shoes - Performed by helper: Don/doff right shoe, Don/doff left shoe, Fasten right, Fasten left AFO - Performed by helper: Don/doff right AFO, Don/doff left AFO TED Hose - Performed by helper: Don/doff right TED hose, Don/doff left TED hose Assist for  footwear: Dependant Assist for lower body dressing: 2 Helpers  Function - Toileting Toileting activity did not occur: No continent bowel/bladder event  Function - Air cabin crew transfer activity did not occur: Safety/medical concerns  Function - Chair/bed transfer Chair/bed transfer method: Lateral scoot Chair/bed transfer assist level: 2 helpers Chair/bed transfer assistive device: Sliding board  Function - Locomotion: Wheelchair Will patient use wheelchair at discharge?: Yes Type: Manual Assist Level: Dependent (Pt equals 0%)(TIS w/c) Assist Level: Dependent (Pt equals 0%) Assist Level: Dependent (Pt equals 0%) Turns around,maneuvers to table,bed, and toilet,negotiates 3% grade,maneuvers on rugs and over doorsills: No Function - Locomotion:  Ambulation Ambulation activity did not occur: Safety/medical concerns Walk 10 feet activity did not occur: Safety/medical concerns Walk 50 feet with 2 turns activity did not occur: Safety/medical concerns Walk 150 feet activity did not occur: Safety/medical concerns Walk 10 feet on uneven surfaces activity did not occur: Safety/medical concerns  Function - Comprehension Comprehension: Auditory Comprehension assistive device: Other(does not have hearing aids present) Comprehension assist level: Understands basic 75 - 89% of the time/ requires cueing 10 - 24% of the time  Function - Expression Expression: Verbal Expression assist level: Expresses basic 50 - 74% of the time/requires cueing 25 - 49% of the time. Needs to repeat parts of sentences.  Function - Social Interaction Social Interaction assist level: Interacts appropriately 90% of the time - Needs monitoring or encouragement for participation or interaction.  Function - Problem Solving Problem solving assist level: Solves basic 50 - 74% of the time/requires cueing 25 - 49% of the time  Function - Memory Memory assistive device: Other (Comment)(requires tactile cues; slow processing ) Memory assist level: Recognizes or recalls 25 - 49% of the time/requires cueing 50 - 75% of the time Patient normally able to recall (first 3 days only): Current season, Staff names and faces, That he or she is in a hospital  Medical Problem List and Plan: 1.  Right side weakness with dysarthria secondary to multiple bilateral infarcts (left BG, tiny Right frontal and bilateral  occipital infarcts) consistent with cardiac emboli with history of atrial fibrillation.Onset 5/29 Team conference today please see physician documentation under team conference tab, met with team face-to-face to discuss problems,progress, and goals. Formulized individual treatment plan based on medical history, underlying problem and comorbidities. 2.  DVT  Prophylaxis/Anticoagulation: Eliquis 3. Pain Management/polyneuropathy: Tylenol as needed 4. Mood: BuSpar 10 mg twice daily, trazodone 50 mg nightly 5. Neuropsych: This patient is capable of making decisions on his own behalf. 6. Skin/Wound Care: Routine skin checks 7. Fluids/Electrolytes/Nutrition: BMET normal 6/4 Appreciate dietary consult  8.  Dysphasia.  Dysphasia #2 nectar liquids.Cortrak tube feeds for nutritional support.  Check calorie counts.  Follow-up speech therapy Poor appetite , trial Megace H20 boluses 9.  History of atrial fibrillation.  Continue Eliquis 54m BID.  Cardiac rate controlled, on metoprolol PTA 10.  Hyperlipidemia.  Zocor on lipitor PTA 11.  History of polyneuropathy.  Patient uses bilateral AFO braces prior to admission. 12.  Hx HTN lasix and Lisinopril PTA Vitals:   09/20/17 1947 09/21/17 0427  BP: (!) 164/91 138/89  Pulse: (!) 59 (!) 58  Resp: 18 19  Temp: 97.8 F (36.6 C) 98 F (36.7 C)  SpO2: 97% 95%  BPs fluctuating still permissive 13.  Hx of GERD- increase  Pepcid to 452mBID LOS (Days) 2 A FACE TO FACE EVALUATION WAS PERFORMED  AnCharlett Blake/08/2017, 8:59 AM

## 2017-09-21 NOTE — Progress Notes (Signed)
Occupational Therapy Session Note  Patient Details  Name: Walter Bryant MRN: [id number] Date of Birth: Mar 19, 1931  Today's Date: 09/21/2017 OT Individual Time: 1400-1500 OT Individual Time Calculation (min): 60 min    Short Term Goals: Week 1:  OT Short Term Goal 1 (Week 1): Pt will perform slide board transfer bed<>chair with max A+2 with initiation of foward weight shift OT Short Term Goal 2 (Week 1): Pt will don shirt with max A  OT Short Term Goal 3 (Week 1): Pt will maintain static sitting balance with min A for ~5 min in prep for functional tasks  OT Short Term Goal 4 (Week 1): Pt will roll in bed to the right with mod A to help with LB clothing management and hygiene  Skilled Therapeutic Interventions/Progress Updates:    Pt seen for OT session focusing on functional transfers, standing tolerance, sitting balance and ADL re-training. Pt sitting up in w/c upon arrival with family members present and hand off from SLP.  Pt taken to therapy gym total A in w/c for time management. Completed sliding board transfer to therapy mat with +2 total A. Pt initially able to maintain static sitting balance EOB with close supervision, however, quickly progressed to mod A due to fatigue and R posterior bias.  Completed x3 sit>stand in SARA PLUS, total A for management of R UE on lift. Multimodal cuing for upright posture.  Pt returned to w/c using SARA PLUS. Returned to room and completed grooming tasks from w/c level at sink. Pt required total A set-up for items, however, then able to use electric toothbrush, and electric razor and wash face. Pt left seated in w/c finishing grooming tasks with assist of daughter at end of session, family members present to assist when finished.  Education provided throughout session regarding role of OT, POC, OT /PT goals, and d/c planning.   Therapy Documentation Precautions:  Precautions Precautions: Fall, Other (comment) Precaution Comments: R hemi, lateral  lean and pushes to the R; Cortrak Required Braces or Orthoses: Other Brace/Splint Other Brace/Splint: bilateral upright AFO's (used PTA. Has h/o charcot foot on L) Restrictions Weight Bearing Restrictions: No Pain:  No/denies pain ADL: ADL ADL Comments: see functional navigator  See Function Navigator for Current Functional Status.   Therapy/Group: Individual Therapy  Evony Rezek L 09/21/2017, 7:19 AM

## 2017-09-22 ENCOUNTER — Inpatient Hospital Stay (HOSPITAL_COMMUNITY): Payer: Medicare Other | Admitting: Speech Pathology

## 2017-09-22 ENCOUNTER — Inpatient Hospital Stay (HOSPITAL_COMMUNITY): Payer: Medicare Other | Admitting: Occupational Therapy

## 2017-09-22 ENCOUNTER — Inpatient Hospital Stay (HOSPITAL_COMMUNITY): Payer: Medicare Other | Admitting: Physical Therapy

## 2017-09-22 MED ORDER — ONDANSETRON HCL 4 MG/5ML PO SOLN
4.0000 mg | Freq: Three times a day (TID) | ORAL | Status: DC | PRN
  Administered 2017-09-23 – 2017-10-09 (×5): 4 mg via ORAL
  Filled 2017-09-22 (×9): qty 5

## 2017-09-22 MED ORDER — JEVITY 1.5 CAL/FIBER PO LIQD
1000.0000 mL | ORAL | Status: DC
  Administered 2017-09-22 – 2017-10-03 (×12): 1000 mL
  Filled 2017-09-22 (×25): qty 1000

## 2017-09-22 MED ORDER — SENNOSIDES-DOCUSATE SODIUM 8.6-50 MG PO TABS
2.0000 | ORAL_TABLET | Freq: Two times a day (BID) | ORAL | Status: DC
  Administered 2017-09-23 – 2017-10-11 (×36): 2 via ORAL
  Filled 2017-09-22 (×36): qty 2

## 2017-09-22 MED ORDER — TEMAZEPAM 7.5 MG PO CAPS
7.5000 mg | ORAL_CAPSULE | Freq: Every evening | ORAL | Status: DC | PRN
  Administered 2017-09-22: 7.5 mg via ORAL
  Filled 2017-09-22: qty 1

## 2017-09-22 NOTE — Progress Notes (Signed)
Physical Therapy Session Note  Patient Details  Name: Walter Bryant MRN: [id number] Date of Birth: Aug 09, 1930  Today's Date: 09/22/2017 PT Individual Time: 0800-0900 PT Individual Time Calculation (min): 60 min   Short Term Goals: Week 1:  PT Short Term Goal 1 (Week 1): Pt will be able to demonstrate reorientation to midline without UE support with mod assist PT Short Term Goal 2 (Week 1): Pt will be able to perform slideboard transfer with +2 assist (pt = 25%) PT Short Term Goal 3 (Week 1): Pt will be able to initiate w/c mobility  Skilled Therapeutic Interventions/Progress Updates:    Pt received seated EOB with OT, agreeable to PT. Sitting balance EOB with min A and multimodal cues for upright posture while receiving medications from RN. Sliding board transfer bed to w/c with assist x 2 to the L. Dependent to don shoes and B AFO. Sit to stand in standing frame 2 x 7 min each with focus on upright posture with use of mirror for visual feedback. Pt exhibits trunk flexion in standing as well as lean to the R, is able to correct with manual and visual cues. Pt left seated in TIS w/c in room with needs in reach at end of therapy session.  Therapy Documentation Precautions:  Precautions Precautions: Fall, Other (comment) Precaution Comments: R hemi, lateral lean and pushes to the R; Cortrak Required Braces or Orthoses: Other Brace/Splint Other Brace/Splint: bilateral upright AFO's (used PTA. Has h/o charcot foot on L) Restrictions Weight Bearing Restrictions: No  See Function Navigator for Current Functional Status.   Therapy/Group: Individual Therapy  Excell Seltzer, PT, DPT  09/22/2017, 12:20 PM

## 2017-09-22 NOTE — Progress Notes (Addendum)
Day 2 of 2: Calorie Count Note  48 hour calorie count ordered.  Diet: Dysphagia 2 diet with nectar thick liquids Supplements:  Ensure Enlive po TID, each supplement provides 350 kcal and 20 grams of protein.  Breakfast: 100 kcal, 1 grams of protein Lunch: 84 kcal, 6 grams of protein Dinner: 179 kcal, 3 grams of protein Supplements: 350 kcal, 20 grams of protein  Day 2Total intake: 713 kcal (34% of minimum estimated needs)  30 grams of protein (30% of minimum estimated needs)  Estimated Nutritional Needs:  Kcal:  2100-2300 Protein:  100-115 grams Fluid:  >/= 2.1 L/day  Nutrition Dx:  Inadequate oral intake related to dysphagia, poor appetite as evidenced by meal completion < 50%; ongoing  Goal:  Pt to meet >/= 90% of their estimated nutrition needs; met via TF  Intervention: Discontinue calorie count.   Continue Ensure Enlive po TID (thickened to appropriate consistency), each supplement provides 350 kcal and 20 grams of protein.   Provide nocturnal tube feeds via Cortrak NGT using Jevity 1.5 formula at rate of 85 ml/hr run over 14 hours (6pm-8am).  Discontinue Prostat.   Provide free water flushes of 200 ml TID per tube. (MD may adjust as appropriate)  Tube feeding regimen to provide 1785 kcal (85% of kcal needs), 76 grams of protein (76% of protein needs), and 1504 ml water.    Corrin Parker, MS, RD, LDN Pager # [phone] After hours/ weekend pager # [phone]

## 2017-09-22 NOTE — Progress Notes (Signed)
Subjective/Complaints:  Poor sleep at night Intermittent nausea.  Feels full from feeding, but no vomiting.  No abdominal pain.  Has had constipation.  Tried drinking some prune juice today. Discussed upper extremity and lower extremity strength we also discussed his complaints of slowness and thought processing. Discussed swallowing issues, he dislikes the diet    ROS- No CP, SOB, N/V/D Objective: Vital Signs: Blood pressure (!) 142/97, pulse 83, temperature 98.2 F (36.8 C), resp. rate 18, height 6' 2"  (1.88 m), weight 98.1 kg (216 lb 4.3 oz), SpO2 97 %. No results found. Results for orders placed or performed during the hospital encounter of 09/19/17 (from the past 72 hour(s))  CBC WITH DIFFERENTIAL     Status: None   Collection Time: 09/20/17  5:01 AM  Result Value Ref Range   WBC 7.5 4.0 - 10.5 K/uL   RBC 4.80 4.22 - 5.81 MIL/uL   Hemoglobin 15.1 13.0 - 17.0 g/dL   HCT 44.0 39.0 - 52.0 %   MCV 91.7 78.0 - 100.0 fL   MCH 31.5 26.0 - 34.0 pg   MCHC 34.3 30.0 - 36.0 g/dL   RDW 14.0 11.5 - 15.5 %   Platelets 159 150 - 400 K/uL   Neutrophils Relative % 75 %   Neutro Abs 5.6 1.7 - 7.7 K/uL   Lymphocytes Relative 11 %   Lymphs Abs 0.8 0.7 - 4.0 K/uL   Monocytes Relative 11 %   Monocytes Absolute 0.8 0.1 - 1.0 K/uL   Eosinophils Relative 2 %   Eosinophils Absolute 0.2 0.0 - 0.7 K/uL   Basophils Relative 1 %   Basophils Absolute 0.1 0.0 - 0.1 K/uL   Immature Granulocytes 0 %   Abs Immature Granulocytes 0.0 0.0 - 0.1 K/uL    Comment: Performed at [facility], [address] [place], [place] [address]  Comprehensive metabolic panel     Status: Abnormal   Collection Time: 09/20/17  5:01 AM  Result Value Ref Range   Sodium 138 135 - 145 mmol/L   Potassium 3.8 3.5 - 5.1 mmol/L   Chloride 105 101 - 111 mmol/L   CO2 23 22 - 32 mmol/L   Glucose, Bld 118 (H) 65 - 99 mg/dL   BUN 16 6 - 20 mg/dL   Creatinine, Ser 0.88 0.61 - 1.24 mg/dL   Calcium 9.3 8.9 - 10.3 mg/dL   Total Protein 5.6 (L) 6.5 - 8.1 g/dL   Albumin 3.1 (L) 3.5 - 5.0 g/dL   AST 40 15 - 41 U/L   ALT 54 17 - 63 U/L   Alkaline Phosphatase 67 38 - 126 U/L   Total Bilirubin 1.9 (H) 0.3 - 1.2 mg/dL   GFR calc non Af Amer >60 >60 mL/min   GFR calc Af Amer >60 >60 mL/min    Comment: (NOTE) The eGFR has been calculated using the CKD EPI equation. This calculation has not been validated in all clinical situations. eGFR's persistently <60 mL/min signify possible Chronic Kidney Disease.    Anion gap 10 5 - 15    Comment: Performed at [facility] [address], [place], [place] [address]     HEENT: Right facial droop, feeding tube R nares Cardio: RRR and no murmur Resp: CTA B/L and unlabored GI: BS positive and NT, ND Extremity:  No Edema Skin:   Other IV sites and nares CDI Neuro: Alert/Oriented, Abnormal Sensory Reduced bilateral below ankles, Abnormal Motor 0/5 RUE and RLE and Dysarthric Musc/Skel:  Other mild pain with Right shoulder ext rotation, neg impingement sign Gen NAD Left ankle df 4/5  Assessment/Plan: 1. 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 Physiatrist is providing close team supervision and 24 hour management of active medical problems listed below. Physiatrist and rehab team continue to assess barriers to discharge/monitor patient progress toward functional and medical goals. FIM: Function - Bathing Position: Bed(NIght bath total A) Body parts bathed by helper: Right arm, Front perineal area, Right lower leg, Left lower leg, Buttocks, Left arm, Abdomen, Right upper leg, Chest, Back, Left upper leg Assist Level: 2 helpers  Function- Upper Body Dressing/Undressing What is the patient wearing?: Pull over shirt/dress Pull over shirt/dress - Perfomed by helper: Thread/unthread right sleeve, Thread/unthread left sleeve, Put head  through opening, Pull shirt over trunk Assist Level: Touching or steadying assistance(Pt > 75%) Function - Lower Body Dressing/Undressing What is the patient wearing?: Pants Position: Bed Pants- Performed by helper: Thread/unthread right pants leg, Thread/unthread left pants leg, Pull pants up/down Socks - Performed by helper: Don/doff right sock, Don/doff left sock Shoes - Performed by helper: Don/doff right shoe, Don/doff left shoe, Fasten right, Fasten left AFO - Performed by helper: Don/doff right AFO, Don/doff left AFO TED Hose - Performed by helper: Don/doff right TED hose, Don/doff left TED hose Assist for footwear: Dependant Assist for lower body dressing: 2 Helpers  Function - Toileting Toileting activity did not occur: No continent bowel/bladder event  Function - Air cabin crew transfer activity did not occur: Safety/medical concerns  Function - Chair/bed transfer Chair/bed transfer method: Lateral scoot Chair/bed transfer assist level: 2 helpers Chair/bed transfer assistive device: Sliding board  Function - Locomotion: Wheelchair Will patient use wheelchair at discharge?: Yes Type: Manual Assist Level: Dependent (Pt equals 0%)(TIS w/c) Assist Level: Dependent (Pt equals 0%) Assist Level: Dependent (Pt equals 0%) Turns around,maneuvers to table,bed, and toilet,negotiates 3% grade,maneuvers on rugs and over doorsills: No Function - Locomotion: Ambulation Ambulation activity did not occur: Safety/medical concerns Walk 10 feet activity did not occur: Safety/medical concerns Walk 50 feet with 2 turns activity did not occur: Safety/medical concerns Walk 150 feet activity did not occur: Safety/medical concerns Walk 10 feet on uneven surfaces activity did not occur: Safety/medical concerns  Function - Comprehension Comprehension: Auditory Comprehension assistive device: Hearing aids Comprehension assist level: Understands basic 75 - 89% of the time/ requires  cueing 10 - 24% of the time  Function - Expression Expression: Verbal Expression assist level: Expresses basic 50 - 74% of the time/requires cueing 25 - 49% of the time. Needs to repeat parts of sentences., Expresses basic 75 - 89% of the time/requires cueing 10 - 24% of the time. Needs helper to occlude trach/needs to repeat words.  Function - Social Interaction Social Interaction assist level: Interacts appropriately 90% of the time - Needs monitoring or encouragement for participation or interaction.  Function - Problem Solving Problem solving assist level: Solves complex 90% of the time/cues < 10% of the time  Function - Memory Memory assistive device: Memory book Memory assist level: Recognizes or recalls 50 - 74% of the time/requires cueing 25 - 49% of the time Patient normally able to recall (first 3 days only): Current season, Staff names and faces, That he or she is in a hospital  Medical Problem List and Plan: 1.  Right side weakness with dysarthria secondary to multiple bilateral infarcts (left BG, tiny Right  frontal and bilateral  occipital infarcts) consistent with cardiac emboli with history of atrial fibrillation.Onset 5/29 Continue CIR PT OT speech 2.  DVT Prophylaxis/Anticoagulation: Eliquis 3. Pain Management/polyneuropathy: Tylenol as needed 4. Mood: BuSpar 10 mg twice daily, trazodone 50 mg nightly 5. Neuropsych: This patient is capable of making decisions on his own behalf. 6. Skin/Wound Care: Routine skin checks 7. Fluids/Electrolytes/Nutrition: BMET normal 6/4 Appreciate dietary consult  8.  Dysphasia.  Dysphasia #2 nectar liquids.Cortrak tube feeds for nutritional support.  Check calorie counts.  Follow-up speech therapy Poor appetite , trial Megace H20 boluses 9.  History of atrial fibrillation.  Continue Eliquis 30m BID.  Cardiac rate controlled, on metoprolol PTA 10.  Hyperlipidemia.  Zocor on lipitor PTA 11.  History of polyneuropathy.  Patient uses  bilateral AFO braces prior to admission.  No up with her deficits however 12.  Hx HTN lasix and Lisinopril PTA Vitals:   09/21/17 2200 09/22/17 0403  BP: (!) 162/80 (!) 142/97  Pulse:  83  Resp:  18  Temp:  98.2 F (36.8 C)  SpO2:  97%  BPs fluctuating still permissive  13.  Hx of GERD- increase  Pepcid to 431mBID 14.  Nausea without vomiting, question medication versus tube feeding versus constipation.  Will write for as needed medication. 15.  Constipation will review and update bowel program LOS (Days) 3 A FACE TO FACE EVALUATION WAS PERFORMED  AnCharlett Blake/09/2017, 8:51 AM

## 2017-09-22 NOTE — Progress Notes (Signed)
Speech Language Pathology Daily Session Note  Patient Details  Name: Walter Bryant MRN: [id number] Date of Birth: 10-03-1930  Today's Date: 09/22/2017 SLP Individual Time: 0930-1030 SLP Individual Time Calculation (min): 60 min  Short Term Goals: Week 1: SLP Short Term Goal 1 (Week 1): Pt will consume thin liquid/ice cups trials via cup sips with min overt s/s aspiration to demonstrate readiness of instrumental swallow assessment. SLP Short Term Goal 2 (Week 1): Pt will consume dys 3 trials with efficient mastication and oral clearnace without overt s/s aspiration to demonstrate readiness of solid upgrade. SLP Short Term Goal 3 (Week 1): Pt will recall new, daily information with Min A verbal cues for external aids. SLP Short Term Goal 4 (Week 1): Pt will demonstrate 90% intelligibility at sentence level with Mod A verbal cues for use of speech intelligibility strategies.  SLP Short Term Goal 4 - Progress (Week 1): Updated due to goal met SLP Short Term Goal 5 (Week 1): Pt will demonstrate functional problem solving in semi-complex tasks with supervision A verbal cues.  Skilled Therapeutic Interventions:Skilled treatment session focused on dysphagia, speech communication and self-care education with pt and his wife. SLP facilitated session by providing skilled observation of pt consuming ice chips with 1 throat clear throughout consumption of 6 oz ice chips. 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 SLP further facilitated session by providing Max A multimodal cues for diaphramatic breathing to increase coordination of phonation and respiration. Pt required Max A to coordinate phonate with respiration. After Max A cues, pt able to increase speech intelligibility to ~ 50% at the phrase level. SLP further facilitated session by completing RMT assessment. Pt obtained respiratory value of 69cmH2O which is below the reference of  103 but above the lower level of normal which is 46cmH2O. Pt obtained inspiratory value of 19cmH2O which is well below reference of 85 as well as lower level of normal (49). Given deficits in vocal intensity and dysphagia, recommend targeting both expiratory and inspiratory muscle training at next session. Education provided to wife and pt was left upright in wheelchair with wife present.      Function:  Eating Eating   Modified Consistency Diet: No(Trials of ice chips with SLP only) Eating Assist Level: Supervision or verbal cues           Cognition Comprehension Comprehension assist level: Understands basic 75 - 89% of the time/ requires cueing 10 - 24% of the time  Expression   Expression assist level: Expresses basic 50 - 74% of the time/requires cueing 25 - 49% of the time. Needs to repeat parts of sentences.;Expresses basic 75 - 89% of the time/requires cueing 10 - 24% of the time. Needs helper to occlude trach/needs to repeat words.  Social Interaction Social Interaction assist level: Interacts appropriately 90% of the time - Needs monitoring or encouragement for participation or interaction.  Problem Solving Problem solving assist level: Solves basic 90% of the time/requires cueing < 10% of the time  Memory Memory assist level: Recognizes or recalls 50 - 74% of the time/requires cueing 25 - 49% of the time    Pain Pain Assessment Pain Score: 0-No pain  Therapy/Group: Individual Therapy  [name] 09/22/2017, 11:04 AM

## 2017-09-22 NOTE — Progress Notes (Signed)
Occupational Therapy Session Note  Patient Details  Name: Walter Bryant MRN: [id number] Date of Birth: 03-27-1931  Today's Date: 09/22/2017 OT Individual Time: 0730-0800 and 1400-1455 OT Individual Time Calculation (min): 30 min and 55 min   Short Term Goals: Week 1:  OT Short Term Goal 1 (Week 1): Pt will perform slide board transfer bed<>chair with max A+2 with initiation of foward weight shift OT Short Term Goal 2 (Week 1): Pt will don shirt with max A  OT Short Term Goal 3 (Week 1): Pt will maintain static sitting balance with min A for ~5 min in prep for functional tasks  OT Short Term Goal 4 (Week 1): Pt will roll in bed to the right with mod A to help with LB clothing management and hygiene  Skilled Therapeutic Interventions/Progress Updates:    Session One: PT seen for OT session focusing on functional mobility and sitting balance. Pt in supine upon arrival, requesting use of urinal. Assist provided though pt unable to void, reports having needed to be cathed throughout the night.  He rolled with mod A to R, max- total A for rolling to L in order for pants to be donned and pulled up. He transferred to EOB with total A using hospital bed functions. Pt sat EOB to don shirt with mod A for sitting balance and multimodal cuing and assist to thread B UEs into shirt. He cont to sit EOB in order to eat breakfast, min A overall, however, when fatigued began with strong posterior bias requiring multimodal cuing and assist for anterior weightshift. Pt able to self feed using L hand following total A for set-up. Pt left seated EOB at end pf session with hand off to PT.    Session Two: Pt seen for OT session focusing on functional transfers and sitting balance/tolerance. Pt in supine upon arrival with wife present, pt requesting assist as he needed to have BM. Pt agreeable to attempt transfer to [facility]. He transferred to sitting EOB with max A +1 using hospital bed functions. SARA Plus used for  transfers throughout session with +2 assist. He transferred onto [facility] though had difficult adjusting for comfort. Pt readjusted multiple times, however, perseverative on "not being on the pot", despite hips fully on BSC, pt unable to be redirected. He stated he would not be able to void from this position. Extensive education provided regarding benefits of upright toileting etc. Pt tolerated sitting on BSC ~20 minutes with no success. Lift used to transfer pt back to tilt-in-space w/c. ROHO cushion provided for increased comfort as pt's wife reports OOB tolerance limited by buttock soreness in chair.  Hands on education provided regarding use of tilt-in-space functions for wife and daughter to assist with pressure relief. Pt left seated in w/c at end of session with family members present.   Therapy Documentation Precautions:  Precautions Precautions: Fall, Other (comment) Precaution Comments: R hemi, lateral lean and pushes to the R; Cortrak Required Braces or Orthoses: Other Brace/Splint Other Brace/Splint: bilateral upright AFO's (used PTA. 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   Therapy/Group: Individual Therapy  Winter Trefz L 09/22/2017, 6:53 AM
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
## 2017-09-23 ENCOUNTER — Inpatient Hospital Stay (HOSPITAL_COMMUNITY): Payer: Medicare Other | Admitting: Physical Therapy

## 2017-09-23 ENCOUNTER — Inpatient Hospital Stay (HOSPITAL_COMMUNITY): Payer: Medicare Other | Admitting: Occupational Therapy

## 2017-09-23 ENCOUNTER — Inpatient Hospital Stay (HOSPITAL_COMMUNITY): Payer: Medicare Other | Admitting: Speech Pathology

## 2017-09-23 MED ORDER — TAMSULOSIN HCL 0.4 MG PO CAPS
0.8000 mg | ORAL_CAPSULE | Freq: Every day | ORAL | Status: DC
  Administered 2017-09-23 – 2017-10-10 (×18): 0.8 mg via ORAL
  Filled 2017-09-23 (×18): qty 2

## 2017-09-23 MED ORDER — BACLOFEN 5 MG HALF TABLET
5.0000 mg | ORAL_TABLET | Freq: Two times a day (BID) | ORAL | Status: DC
  Administered 2017-09-23 – 2017-09-25 (×5): 5 mg via ORAL
  Filled 2017-09-23 (×5): qty 1

## 2017-09-23 MED ORDER — PANTOPRAZOLE SODIUM 40 MG PO TBEC
40.0000 mg | DELAYED_RELEASE_TABLET | Freq: Every day | ORAL | Status: DC
  Administered 2017-09-23 – 2017-10-11 (×19): 40 mg via ORAL
  Filled 2017-09-23 (×19): qty 1

## 2017-09-23 MED ORDER — TEMAZEPAM 7.5 MG PO CAPS
15.0000 mg | ORAL_CAPSULE | Freq: Every evening | ORAL | Status: DC | PRN
  Administered 2017-09-23: 15 mg via ORAL
  Filled 2017-09-23: qty 2

## 2017-09-23 NOTE — Progress Notes (Signed)
Pt felt urge to urinated and requested to stand to pee. After speaking with PT, RN decided to use sara to stand patient at bedside. Pt was unable to pee after standing for as long as he was able to tolerate. RN returned pt to chair and performed I&O cath to empty bladder.

## 2017-09-23 NOTE — Progress Notes (Signed)
Social Work Patient ID: Walter Bryant, male   DOB: Oct 18, 1930, 84 y.o.   MRN: [id number]     Walter Bryant, [name]  Social Worker    Patient Care Conference  Addendum  Date of Service:  09/23/2017 12:29 AM              Show:Clear all [x] Manual[x] Template[] Copied  Added by: [x] Phinehas Grounds, Gerline Legacy, Walter Bryant   [] Hover for details   Inpatient RehabilitationTeam Conference and Plan of Care Update Date: 09/21/2017   Time: 10:55 AM      Patient Name: Walter Bryant      Medical Record Number: [id number]  Date of Birth: [birth year] Sex: Male         Room/Bed: 4W26C/4W26C-01 Payor Info: Payor: MEDICARE / Plan: MEDICARE PART A AND B / Product Type: *No Product type* /     Admitting Diagnosis: CVA  Admit Date/Time:  09/19/2017  6:45 PM Admission Comments: No comment available    Primary Diagnosis:  <principal problem not specified> Principal Problem: <principal problem not specified>       Patient Active Problem List    Diagnosis Date Noted  . Basal ganglia stroke ([place]) 09/19/2017  . Dysphagia, post-stroke    . PAF (paroxysmal atrial fibrillation) ([place])    . Hyperlipidemia    . Goals of care, counseling/discussion    . Hiccoughs    . Palliative care by specialist    . Acute ischemic stroke ([place]) 09/14/2017  . HTN (hypertension) 09/14/2017  . A-fib ([place]) 09/14/2017  . Hyponatremia 08/17/2017  . Fluid overload 08/16/2017  . Anxiety state 08/16/2017  . CAP (community acquired pneumonia) 08/15/2017  . Polyneuropathy in other diseases classified elsewhere ([place]) 12/20/2013  . Abnormality of gait 12/20/2013  . Non-healing right groin open wound 04/09/2013      Expected Discharge Date: Expected Discharge Date: 09/30/2017   Team Members Present: Physician leading conference: Dr. Alysia Penna Social Worker Present: Walter Bryant, Walter Bryant Nurse Present: Other (comment)(Latoya Drue Flirt, LPN) PT Present: Lavone Nian, PT OT Present: Napoleon Form, OT SLP Present: Charolett Bumpers, SLP PPS Coordinator present : Daiva Nakayama, RN, CRRN       Current Status/Progress Goal Weekly Team Focus  Medical     poor oral intake, supplemented with TF at night and H2o flushes  Maintain med stability, adequate oral intake of meds, fluids and calories  initiate rehab program, increase oral intake   Bowel/Bladder     foley. 6\4. no bm since 5\29  To void and maintain daily bms without assistance.   Timed toileting Q2hrs and as needed.    Swallow/Nutrition/ Hydration     dys 2 and NTL  Mod I  water protocol and dys 3 trials    ADL's     total A for slide board transfers, ADL at bed level for LB with total A +2 and total a for UB in chair, right UE Brun[place] I  mod A overall   activity tolerance, functional transfers, static sitting balance    Mobility     +2 assist slide board transfers, total assist bed mobility  mod assist overall  transfers, balance, strengthening, bed mobility, NMR, activity tolerance, standing, w/c mobility, pt/family education   Communication     Mod A - vocal intensity and intelligibility at sentence level and word finding   Mod I  possible use of RMT, speech intelligibility strategies and error awareness, word finding strategies   Safety/Cognition/ Behavioral Observations   Mod A ? further assess  Mod I  right scanning ,selective attention and problem solving    Pain     Denies daily pain.   To continue to be pain free.       Skin     n\a           Rehab Goals Patient on target to meet rehab goals: Yes Rehab Goals Revised: none - first conference *See Care Plan and progress notes for long and short-term goals.      Barriers to Discharge   Current Status/Progress Possible Resolutions Date Resolved   Physician     Medical stability;Medication compliance     progressing toward goals  cont rehab, megace trial for appetite      Nursing                 PT  Decreased caregiver support;Nutrition means  pt was at [facility], limited hired caregivers &  wife unable to provide total assist              OT                 SLP            SW              Discharge Planning/Teaching Needs:  Pt lives in independent living cottage at [facility] and family plans for him to go to skilled rehab area before trying to be at home with wife and morning aide.  Defer to next venue.   Team Discussion:  Pt with foley catheter and megace started today.  Pt was eager to get up this morning and dtr and wife are attentive.  Pt's needs +2 for all PT tasks right now and he is using his AFOs that he used PTA.  Pt has an aide at home from 7am-1pm who helped with getting pt ready in the mornings/min A level PTA.  Pt is on D2 diet with trials with SLP of D3.  He is on nectar thick liquids and water protocol.  Speech is also working on increasing pt's vocal intensity and speech intelligibility.  Wants to do RMT and Dr. Letta Pate approved it as long as pt can tolerate his nose being occluded with the cortrak.  Pt was doing finances PTA, so SLP to also work on problem solving tasks.  Revisions to Treatment Plan: none    Continued Need for Acute Rehabilitation Level of Care: The patient requires daily medical management by a physician with specialized training in physical medicine and rehabilitation for the following conditions: Daily direction of a multidisciplinary physical rehabilitation program to ensure safe treatment while eliciting the highest outcome that is of practical value to the patient.: Yes Daily medical management of patient stability for increased activity during participation in an intensive rehabilitation regime.: Yes Daily analysis of laboratory values and/or radiology reports with any subsequent need for medication adjustment of medical intervention for : Neurological problems;Blood pressure problems   Ixchel Duck, Silvestre Mesi 09/23/2017, 10:16 PM           Revision History

## 2017-09-23 NOTE — Progress Notes (Signed)
Physical Therapy Session Note  Patient Details  Name: Walter Bryant MRN: [id number] Date of Birth: 01/14/1931  Today's Date: 09/23/2017 PT Individual Time: 8938-1017 and 5102-5852 PT Individual Time Calculation (min): 54 min and 48 min   Short Term Goals: Week 1:  PT Short Term Goal 1 (Week 1): Pt will be able to demonstrate reorientation to midline without UE support with mod assist PT Short Term Goal 2 (Week 1): Pt will be able to perform slideboard transfer with +2 assist (pt = 25%) PT Short Term Goal 3 (Week 1): Pt will be able to initiate w/c mobility  Skilled Therapeutic Interventions/Progress Updates:  Treatment 1: Pt received in TIS & agreeable to tx. During session pt reports L shoulder pain but also notes hx of arthritis & pain for past 5 years - pt declined pain medication at this time & activities modified PRN. Transported pt to/from gym via w/c total assist for time management. Pt completes slide board transfer to either equal height surface or lower surface with +2 assist (pt performing 50% of work). Therapist educates & provides multimodal cuing for head/hips relationship (pt would benefit from further education/practce with this as he had poor demo). Pt is able to push/pull with LUE to assist with scooting across board. Pt tolerated sitting on EOM ~20-25 minutes with BLE support and wedge placed under buttocks to promote neutral pelvic tilt as pt with significant posterior pelvic tilt at rest. Pt engaged in reaching outside of BOS in all directions with LUE and returning to midline with task focusing on dynamic balance and core strengthening. Pt with occaasional LOB to R with min/mod assist to correct but pt with good ability to return to midline. Pt required frequent rest breaks during session 2/2 fatigue. At end of session pt left sitting in w/c with seat belt donned and all needs within reach.   Treatment 2: Pt received in w/c & agreeable to tx. No c/o pain reported but pt noting  8-9/10 fatigue but requesting to attempt standing. Transported pt to dayroom via w/c total assist and provided total assist for standing frame set up. Pt tolerated standing 4 minutes + 4 minutes in standing frame before requiring rest break 2/2 fatigue. Task focused on midline orientation, standing tolerance, and NMR. Pt utilized mirror for visual feedback to correct minimal lean to R. Pt with significant hip weight shift to R and therapist unable to correct even with manual facilitation and pt with limited ability to assist. Provided pt with reclining back w/c and educated him on potential to propel w/c with L hemi technique. Pt reports he likes this chair better since he is able to touch the floor with BLE. At end of session pt left sitting in reclining back w/c with wife present to supervise and call bell in reach. Notified nursing staff for need to obtain and place quick release belt on pt.   Therapy Documentation Precautions:  Precautions Precautions: Fall, Other (comment) Precaution Comments: R hemi, lateral lean and pushes to the R; Cortrak Required Braces or Orthoses: Other Brace/Splint Other Brace/Splint: bilateral upright AFO's (used PTA. 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   Therapy/Group: Individual Therapy  [name] 09/23/2017, 4:18 PM

## 2017-09-23 NOTE — Progress Notes (Signed)
Subjective/Complaints:  Heartburn/reflux Freq hiccoughs No voids since d/c foley was on flomax .8mg  at home , Dr Risa Grill, uro Poor sleep despite temazepam 7.5mg  Daughter at bedside Increase finger flexors   ROS- No CP, SOB, N/V/D Objective: Vital Signs: Blood pressure (!) 142/96, pulse 80, temperature 98 F (36.7 C), temperature source Oral, resp. rate 18, height 6\' 2"  (1.88 m), weight 98.1 kg (216 lb 4.3 oz), SpO2 97 %. No results found. No results found for this or any previous visit (from the past 72 hour(s)).   HEENT: Right facial droop, feeding tube R nares Cardio: RRR and no murmur Resp: CTA B/L and unlabored GI: BS positive and NT, ND Extremity:  No Edema Skin:   Other IV sites and nares CDI Neuro: Alert/Oriented, Abnormal Sensory Reduced bilateral below ankles, Abnormal Motor 0/5 RUE and RLE and Dysarthric Musc/Skel:  Other mild pain with Right shoulder ext rotation, neg impingement sign Gen NAD Left ankle df 4/5  Assessment/Plan: 1. 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 Physiatrist is providing close team supervision and 24 hour management of active medical problems listed below. Physiatrist and rehab team continue to assess barriers to discharge/monitor patient progress toward functional and medical goals. FIM: Function - Bathing Position: Bed(NIght bath total A) Body parts bathed by helper: Right arm, Front perineal area, Right lower leg, Left lower leg, Buttocks, Left arm, Abdomen, Right upper leg, Chest, Back, Left upper leg Assist Level: 2 helpers  Function- Upper Body Dressing/Undressing What is the patient wearing?: Pull over shirt/dress Pull over shirt/dress - Perfomed by patient: Pull shirt over trunk, Put head through opening Pull over shirt/dress - Perfomed by helper: Thread/unthread left sleeve, Thread/unthread right  sleeve, Put head through opening Assist Level: Touching or steadying assistance(Pt > 75%) Function - Lower Body Dressing/Undressing What is the patient wearing?: Pants Position: Bed Pants- Performed by helper: Thread/unthread right pants leg, Thread/unthread left pants leg, Pull pants up/down Socks - Performed by helper: Don/doff right sock, Don/doff left sock Shoes - Performed by helper: Don/doff right shoe, Don/doff left shoe, Fasten right, Fasten left AFO - Performed by helper: Don/doff right AFO, Don/doff left AFO TED Hose - Performed by helper: Don/doff right TED hose, Don/doff left TED hose Assist for footwear: Maximal assist Assist for lower body dressing: 2 Helpers  Function - Toileting Toileting activity did not occur: No continent bowel/bladder event Toileting steps completed by helper: Adjust clothing prior to toileting, Performs perineal hygiene, Adjust clothing after toileting Toileting Assistive Devices: Other (comment)(Using [employer]) Assist level: Two helpers  Function - Air cabin crew transfer activity did not occur: Safety/medical concerns Toilet transfer assistive device: Facilities manager lift: Garment/textile technologist level to toilet: 2 helpers Assist level from toilet: 2 helpers  Function - Chair/bed transfer Chair/bed transfer [place]: Lateral scoot Chair/bed transfer assist level: 2 helpers Chair/bed transfer assistive device: Sliding board Chair/bed transfer details: Verbal cues for sequencing, Verbal cues for technique, Verbal cues for safe use of DME/AE, Manual facilitation for weight shifting, Manual facilitation for placement  Function - Locomotion: Wheelchair Will patient use wheelchair at discharge?: Yes Type: Manual Assist Level: Dependent (Pt equals 0%)(TIS w/c) Assist Level: Dependent (Pt equals 0%) Assist Level: Dependent (Pt equals 0%) Turns around,maneuvers to table,bed, and toilet,negotiates 3% grade,maneuvers on rugs and over  doorsills: No Function - Locomotion: Ambulation Ambulation activity did not occur: Safety/medical concerns Walk 10 feet activity did not occur:  Safety/medical concerns Walk 50 feet with 2 turns activity did not occur: Safety/medical concerns Walk 150 feet activity did not occur: Safety/medical concerns Walk 10 feet on uneven surfaces activity did not occur: Safety/medical concerns  Function - Comprehension Comprehension: Auditory Comprehension assistive device: Hearing aids Comprehension assist level: Understands basic 75 - 89% of the time/ requires cueing 10 - 24% of the time  Function - Expression Expression: Verbal Expression assist level: Expresses basic 50 - 74% of the time/requires cueing 25 - 49% of the time. Needs to repeat parts of sentences., Expresses basic 75 - 89% of the time/requires cueing 10 - 24% of the time. Needs helper to occlude trach/needs to repeat words.  Function - Social Interaction Social Interaction assist level: Interacts appropriately 90% of the time - Needs monitoring or encouragement for participation or interaction.  Function - Problem Solving Problem solving assist level: Solves basic 90% of the time/requires cueing < 10% of the time  Function - Memory Memory assistive device: Memory book Memory assist level: Recognizes or recalls 50 - 74% of the time/requires cueing 25 - 49% of the time Patient normally able to recall (first 3 days only): Current season, Staff names and faces, That he or she is in a hospital  Medical Problem List and Plan: 1.  Right side weakness with dysarthria secondary to multiple bilateral infarcts (left BG, tiny Right frontal and bilateral  occipital infarcts) consistent with cardiac emboli with history of atrial fibrillation.Onset 5/29 Continue CIR PT OT speech 2.  DVT Prophylaxis/Anticoagulation: Eliquis 3. Pain Management/polyneuropathy: Tylenol as needed 4. Mood: BuSpar 10 mg twice daily, trazodone 50 mg nightly 5.  Neuropsych: This patient is capable of making decisions on his own behalf. 6. Skin/Wound Care: Routine skin checks 7. Fluids/Electrolytes/Nutrition: BMET normal 6/4 Appreciate dietary consult  8.  Dysphasia.  Dysphasia #2 nectar liquids.Cortrak tube feeds for nutritional support.  Check calorie counts.  Follow-up speech therapy Poor appetite , trial Megace H20 boluses 9.  History of atrial fibrillation.  Continue Eliquis 5mg  BID.  Cardiac rate controlled, on metoprolol PTA 10.  Hyperlipidemia.  Zocor on lipitor PTA 11.  History of polyneuropathy.  Patient uses bilateral AFO braces prior to admission.  No up with her deficits however 12.  Hx HTN lasix and Lisinopril PTA Vitals:   09/22/17 2059 09/23/17 0420  BP: 132/78 (!) 142/96  Pulse: 76 80  Resp: 18 18  Temp: 98 F (36.7 C) 98 F (36.7 C)  SpO2: 98% 97%  BPs fluctuating still permissive  13.  Hx of GERD-  Pepcid to 40mg  BID, add protonix 40mg  daily 14.  Nausea without vomiting, question medication versus tube feeding versus constipation.  Will write for as needed medication. 15.  Constipation improving per pt 16.  Urinary retention likely multifactorial immobility and prob BPH, increase flomax to home dose 0.8mg  LOS (Days) 4 A FACE TO FACE EVALUATION WAS PERFORMED  Charlett Blake 09/23/2017, 8:31 AM

## 2017-09-23 NOTE — Progress Notes (Signed)
Occupational Therapy Session Note  Patient Details  Name: Walter Bryant MRN: [id number] Date of Birth: February 10, 1931  Today's Date: 09/23/2017 OT Individual Time: 6378-5885 OT Individual Time Calculation (min): 60 min    Short Term Goals: Week 1:  OT Short Term Goal 1 (Week 1): Pt will perform slide board transfer bed<>chair with max A+2 with initiation of foward weight shift OT Short Term Goal 2 (Week 1): Pt will don shirt with max A  OT Short Term Goal 3 (Week 1): Pt will maintain static sitting balance with min A for ~5 min in prep for functional tasks  OT Short Term Goal 4 (Week 1): Pt will roll in bed to the right with mod A to help with LB clothing management and hygiene  Skilled Therapeutic Interventions/Progress Updates:    Pt seen for OT session focusing on functional mobility and ADL re-training. Pt awake in supine upon arrival with daughter present, agreeable to tx session and denying pain. New brief and pants donned total A from bed level with pt able to assist with bridging and +2 to pull pants up entirely. HE transferred to sitting EOB with max A +1, able to assist with brining L LE off EOB.  Completed sliding board transfer to w/c, transferring to strong L side. Max A initially, however, once pt able to reach w/c armrest with L hand he was able to assist with pulling self into chair and completed remainder of transfer with mod A. UB dressing completed from w/c level. Required assist for set-up/ orientation of shirt and assist to manage R UE when pulling shirt over head.  Attempted to complete modified stand at sink in order to adjust pt's shorts per request, despite +2 max A, unable to clear buttock more than a couple of inches from chair. Pt left seated in w/c at end of session, set-up with breakfast tray and daughter present to assist. Throughout session, pt required VCs for redirection to task due to being easily distracted by internal stimuli. Also demonstrates some impaired  problem solving/ awareness of deficits when attempting to problem solve during dressing task.  Pt very excited to show therapist new movement in R hand including weak grasp and release and elbow flexion/extension in gravity eliminated position.  Therapy Documentation Precautions:  Precautions Precautions: Fall, Other (comment) Precaution Comments: R hemi, lateral lean and pushes to the R; Cortrak Required Braces or Orthoses: Other Brace/Splint Other Brace/Splint: bilateral upright AFO's (used PTA. 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   Therapy/Group: Individual Therapy  Mckaila Duffus L 09/23/2017, 6:44 AM

## 2017-09-23 NOTE — Patient Care Conference (Addendum)
Inpatient RehabilitationTeam Conference and Plan of Care Update Date: 09/21/2017   Time: 10:55 AM    Patient Name: Walter Bryant [facility]      Medical Record Number: [id number]  Date of Birth: 1930-12-28 Sex: Male         Room/Bed: 4W26C/4W26C-01 Payor Info: Payor: MEDICARE / Plan: MEDICARE PART A AND B / Product Type: *No Product type* /    Admitting Diagnosis: CVA  Admit Date/Time:  09/19/2017  6:45 PM Admission Comments: No comment available   Primary Diagnosis:  <principal problem not specified> Principal Problem: <principal problem not specified>  Patient Active Problem List   Diagnosis Date Noted  . Basal ganglia stroke ([place]) 09/19/2017  . Dysphagia, post-stroke   . PAF (paroxysmal atrial fibrillation) ([place])   . Hyperlipidemia   . Goals of care, counseling/discussion   . Hiccoughs   . Palliative care by specialist   . Acute ischemic stroke ([place]) 09/14/2017  . HTN (hypertension) 09/14/2017  . A-fib ([place]) 09/14/2017  . Hyponatremia 08/17/2017  . Fluid overload 08/16/2017  . Anxiety state 08/16/2017  . CAP (community acquired pneumonia) 08/15/2017  . Polyneuropathy in other diseases classified elsewhere ([place]) 12/20/2013  . Abnormality of gait 12/20/2013  . Non-healing right groin open wound 04/09/2013    Expected Discharge Date: Expected Discharge Date: 10/01/2017  Team Members Present: Physician leading conference: Dr. Alysia Penna Social Worker Present: Alfonse Alpers, LCSW Nurse Present: Other (comment)(Latoya Drue Flirt, LPN) PT Present: Lavone Nian, PT OT Present: Napoleon Form, OT SLP Present: Charolett Bumpers, SLP PPS Coordinator present : Daiva Nakayama, RN, CRRN     Current Status/Progress Goal Weekly Team Focus  Medical   poor oral intake, supplemented with TF at night and H2o flushes  Maintain med stability, adequate oral intake of meds, fluids and calories  initiate rehab program, increase oral intake   Bowel/Bladder   foley. 6\4. no bm since 5\29  To void and maintain  daily bms without assistance.   Timed toileting Q2hrs and as needed.    Swallow/Nutrition/ Hydration   dys 2 and NTL  Mod I  water protocol and dys 3 trials    ADL's   total A for slide board transfers, ADL at bed level for LB with total A +2 and total a for UB in chair, right UE Brunstromm I  mod A overall   activity tolerance, functional transfers, static sitting balance    Mobility   +2 assist slide board transfers, total assist bed mobility  mod assist overall  transfers, balance, strengthening, bed mobility, NMR, activity tolerance, standing, w/c mobility, pt/family education   Communication   Mod A - vocal intensity and intelligibility at sentence level and word finding   Mod I  possible use of RMT, speech intelligibility strategies and error awareness, word finding strategies   Safety/Cognition/ Behavioral Observations  Mod A ? further assess   Mod I  right scanning ,selective attention and problem solving    Pain   Denies daily pain.   To continue to be pain free.       Skin   n\a          Rehab Goals Patient on target to meet rehab goals: Yes Rehab Goals Revised: none - first conference *See Care Plan and progress notes for long and short-term goals.     Barriers to Discharge  Current Status/Progress Possible Resolutions Date Resolved   Physician    Medical stability;Medication compliance     progressing toward goals  cont rehab, megace  trial for appetite      Nursing                  PT  Decreased caregiver support;Nutrition means  pt was at ILF, limited hired caregivers & wife unable to provide total assist              OT                  SLP                SW                Discharge Planning/Teaching Needs:  Pt lives in independent living cottage at [place] and family plans for him to go to skilled rehab area before trying to be at home with wife and morning aide.  Defer to next venue.   Team Discussion:  Pt with foley catheter and megace started  today.  Pt was eager to get up this morning and dtr and wife are attentive.  Pt's needs +2 for all PT tasks right now and he is using his AFOs that he used PTA.  Pt has an aide at home from 7am-1pm who helped with getting pt ready in the mornings/min A level PTA.  Pt is on D2 diet with trials with SLP of D3.  He is on nectar thick liquids and water protocol.  Speech is also working on increasing pt's vocal intensity and speech intelligibility.  Wants to do RMT and Dr. Letta Pate approved it as long as pt can tolerate his nose being occluded with the cortrak.  Pt was doing finances PTA, so SLP to also work on problem solving tasks.  Revisions to Treatment Plan: none    Continued Need for Acute Rehabilitation Level of Care: The patient requires daily medical management by a physician with specialized training in physical medicine and rehabilitation for the following conditions: Daily direction of a multidisciplinary physical rehabilitation program to ensure safe treatment while eliciting the highest outcome that is of practical value to the patient.: Yes Daily medical management of patient stability for increased activity during participation in an intensive rehabilitation regime.: Yes Daily analysis of laboratory values and/or radiology reports with any subsequent need for medication adjustment of medical intervention for : Neurological problems;Blood pressure problems  Miraya Cudney, Silvestre Mesi 09/23/2017, 10:16 PM

## 2017-09-23 NOTE — Progress Notes (Signed)
Speech Language Pathology Daily Session Note  Patient Details  Name: Walter Bryant MRN: [id number] Date of Birth: 05-Mar-1931  Today's Date: 09/23/2017 SLP Individual Time: 0802-2336 SLP Individual Time Calculation (min): 25 min  Short Term Goals: Week 1: SLP Short Term Goal 1 (Week 1): Pt will consume thin liquid/ice cups trials via cup sips with min overt s/s aspiration to demonstrate readiness of instrumental swallow assessment. SLP Short Term Goal 2 (Week 1): Pt will consume dys 3 trials with efficient mastication and oral clearnace without overt s/s aspiration to demonstrate readiness of solid upgrade. SLP Short Term Goal 3 (Week 1): Pt will recall new, daily information with Min A verbal cues for external aids. SLP Short Term Goal 4 (Week 1): Pt will demonstrate 90% intelligibility at sentence level with Mod A verbal cues for use of speech intelligibility strategies.  SLP Short Term Goal 4 - Progress (Week 1): Updated due to goal met SLP Short Term Goal 5 (Week 1): Pt will demonstrate functional problem solving in semi-complex tasks with supervision A verbal cues.  Skilled Therapeutic Interventions:  Pt was seen for skilled ST targeting dysphagia goals.  SLP facilitated the session with trials of thin liquids following oral care to continue working towards diet progression.  Pt consumed thin liquids with min verbal and tactile cues for complete chin tuck with no overt s/s of aspiration.  With trials of thin liquids consumed in head neutral position pt demonstrated s/s of adequate airway protection.  Given sensed aspiration/penetration on MBS, pt appears to making good progress clinically towards diet advancement. Discussed parameters of the water protocol with pt's daughter who was present.  Pt was left in wheelchair with call bell within reach and daughter at bedside.  Continue per current plan of care.    Function:  Eating Eating   Modified Consistency Diet: Yes Eating Assist Level:  Supervision or verbal cues           Cognition Comprehension Comprehension assist level: Follows basic conversation/direction with extra time/assistive device  Expression   Expression assist level: Expresses basic 75 - 89% of the time/requires cueing 10 - 24% of the time. Needs helper to occlude trach/needs to repeat words.  Social Interaction Social Interaction assist level: Interacts appropriately 90% of the time - Needs monitoring or encouragement for participation or interaction.  Problem Solving Problem solving assist level: Solves basic 90% of the time/requires cueing < 10% of the time  Memory Memory assist level: Recognizes or recalls 75 - 89% of the time/requires cueing 10 - 24% of the time    Pain Pain Assessment Pain Scale: 0-10 Pain Score: 0-No pain  Therapy/Group: Individual Therapy  Slyvester Latona, Selinda Orion 09/23/2017, 9:37 AM

## 2017-09-23 NOTE — Progress Notes (Signed)
Social Work Patient ID: Walter Bryant, male   DOB: 04/18/1931, 84 y.o.   MRN: [id number]   CSW met with pt, his wife, and two dtrs 09-21-17 to update them on team conference discussion and targeted length of stay of 09/24/2017.  They were pleased with this.  CSW stated that this could change pending their d/c plan and bed availability at [facility], if they decide to go SNF for a bit.  Family seemed relieved by hearing that SNF may be a good step in between CIR and home.  They plan to contact [facility] and will have them be in touch with CSW.  CSW told the that the goals were mod A at wheelchair level and they seemed to think SNF was a good idea.  CSW will continue to follow and assist as needed.

## 2017-09-24 ENCOUNTER — Inpatient Hospital Stay (HOSPITAL_COMMUNITY): Payer: Medicare Other

## 2017-09-24 ENCOUNTER — Inpatient Hospital Stay (HOSPITAL_COMMUNITY): Payer: Medicare Other | Admitting: Speech Pathology

## 2017-09-24 ENCOUNTER — Inpatient Hospital Stay (HOSPITAL_COMMUNITY): Payer: Medicare Other | Admitting: Physical Therapy

## 2017-09-24 DIAGNOSIS — R1312 Dysphagia, oropharyngeal phase: Secondary | ICD-10-CM

## 2017-09-24 DIAGNOSIS — F5101 Primary insomnia: Secondary | ICD-10-CM

## 2017-09-24 DIAGNOSIS — I10 Essential (primary) hypertension: Secondary | ICD-10-CM

## 2017-09-24 MED ORDER — QUETIAPINE FUMARATE 25 MG PO TABS
12.5000 mg | ORAL_TABLET | Freq: Every day | ORAL | Status: DC
  Administered 2017-09-24: 12.5 mg via ORAL
  Filled 2017-09-24: qty 1

## 2017-09-24 NOTE — Progress Notes (Signed)
Speech Language Pathology Daily Session Note  Patient Details  Name: Walter Bryant MRN: [id number] Date of Birth: 1930-12-07  Today's Date: 09/24/2017 SLP Individual Time: 0925-1025 SLP Individual Time Calculation (min): 60 min  Short Term Goals: Week 1: SLP Short Term Goal 1 (Week 1): Pt will consume thin liquid/ice cups trials via cup sips with min overt s/s aspiration to demonstrate readiness of instrumental swallow assessment. SLP Short Term Goal 2 (Week 1): Pt will consume dys 3 trials with efficient mastication and oral clearnace without overt s/s aspiration to demonstrate readiness of solid upgrade. SLP Short Term Goal 3 (Week 1): Pt will recall new, daily information with Min A verbal cues for external aids. SLP Short Term Goal 4 (Week 1): Pt will demonstrate 90% intelligibility at sentence level with Mod A verbal cues for use of speech intelligibility strategies.  SLP Short Term Goal 4 - Progress (Week 1): Updated due to goal met SLP Short Term Goal 5 (Week 1): Pt will demonstrate functional problem solving in semi-complex tasks with supervision A verbal cues.  Skilled Therapeutic Interventions: Skilled treatment session focused on cognitive and dysphagia goals. Upon arrival, patient was awake while upright in bed. Patient appeared frustrated and requested to get out of bed. Patient's daughter present and reported patient had a "rough night" due to lack of sleep because he "thought he was going to die." Patient reports this morning that he knows that that wasn't true and feels a medication he is currently taking made him feel that way. MD aware. Patient was transferred to the wheelchair with Total A +2 for safety via the [facility]. Once upright in the chair, patient appeared more comfortable and brighter. SLP facilitated session by providing education and overall Min A verbal cues for accuracy with the IMST device. Patient performed 25 repetitions at 13 cm H2O without reports of distress and  with a self-perceived effort level of 5/10.  Patient's daughter also educated on use. Patient also consumed trials of ice chips (per his preference) after oral care. Patient consumed trials without overt s/s of aspiration with supervision verbal cues for use of strategies.  Recommend continued trials with SLP. Patient left upright in wheelchair with daughter present. Continue with current plan of care.      Function:  Eating Eating   Modified Consistency Diet: Yes Eating Assist Level: Supervision or verbal cues           Cognition Comprehension Comprehension assist level: Follows basic conversation/direction with extra time/assistive device  Expression   Expression assist level: Expresses basic 75 - 89% of the time/requires cueing 10 - 24% of the time. Needs helper to occlude trach/needs to repeat words.  Social Interaction Social Interaction assist level: Interacts appropriately 90% of the time - Needs monitoring or encouragement for participation or interaction.  Problem Solving    Memory Memory assist level: Recognizes or recalls 75 - 89% of the time/requires cueing 10 - 24% of the time    Pain Pain Assessment Pain Score: 0-No pain  Therapy/Group: Individual Therapy  Walter Bryant 09/24/2017, 12:05 PM

## 2017-09-24 NOTE — Progress Notes (Signed)
Occupational Therapy Session Note  Patient Details  Name: Walter Bryant MRN: [id number] Date of Birth: July 07, 1930  Today's Date: 09/24/2017 OT Individual Time: 4098-1191 OT Individual Time Calculation (min): 58 min    Short Term Goals: Week 1:  OT Short Term Goal 1 (Week 1): Pt will perform slide board transfer bed<>chair with max A+2 with initiation of foward weight shift OT Short Term Goal 2 (Week 1): Pt will don shirt with max A  OT Short Term Goal 3 (Week 1): Pt will maintain static sitting balance with min A for ~5 min in prep for functional tasks  OT Short Term Goal 4 (Week 1): Pt will roll in bed to the right with mod A to help with LB clothing management and hygiene  Skilled Therapeutic Interventions/Progress Updates:    1;1. Pt with extreme fatigue from not sleeping from night before. Pt reporting "bad reaction" to pain medication and with low mood throughout session. Pt voicing concerns about being burden on family. Pt agreeable to tx at bed level d/t fatigue. Pt bathes at bed level with A to wash LUE (HOH A of RUE for NMR), back, buttocks, and B lower legs. Pt able to cross LLE over R and reach nearly down to ankle for washing. Pt completes LB dressing at bed level with total A, rolling B with MAX A to L and MIN A to R for clothing management/cleansing. Pt dons pull over shirt with A to thread R sleeve and pull shirt down back. Pt requires increased time to fond arm and head hole after RUE threaded. Pt shaves with electric razor 90% of face with OT finishing last section d/t fatigue. Exited session with pt seated in bed, arm supported on pillow and call light in reach  Therapy Documentation Precautions:  Precautions Precautions: Fall, Other (comment) Precaution Comments: R hemi, lateral lean and pushes to the R; Cortrak Required Braces or Orthoses: Other Brace/Splint Other Brace/Splint: bilateral upright AFO's (used PTA. Has h/o charcot foot on L) Restrictions Weight Bearing  Restrictions: No  See Function Navigator for Current Functional Status.   Therapy/Group: Individual Therapy  Tonny Branch 09/24/2017, 3:41 PM

## 2017-09-24 NOTE — Progress Notes (Signed)
Physical Therapy Session Note  Patient Details  Name: Walter Bryant MRN: [id number] Date of Birth: January 08, 1931  Today's Date: 09/24/2017 PT Individual Time: 1941-7408 PT Individual Time Calculation (min): 75 min   Short Term Goals: Week 1:  PT Short Term Goal 1 (Week 1): Pt will be able to demonstrate reorientation to midline without UE support with mod assist PT Short Term Goal 2 (Week 1): Pt will be able to perform slideboard transfer with +2 assist (pt = 25%) PT Short Term Goal 3 (Week 1): Pt will be able to initiate w/c mobility  Skilled Therapeutic Interventions/Progress Updates:  Pt received in TIS w/c & agreeable to tx. No c/o pain reported but pt very fatigued after restless night of sleep. Therapist donned pt's B AFO's and shoes total assist with pt directing how to properly don braces. Transported pt to gym via w/c total assist. Pt completes slide board transfer to slightly elevated mat with max assist +1 and max multimodal cuing for anterior weight shifting, weight shifting across board, and cuing for sequencing. Once sitting EOM therapist donned maxisky sling and educated pt on ability to attempt standing with use of maxisky and parallel bars. Attempted to stand 3 times but pt unable to activate glutes/hamstrings to initiate anterior pelvic shift. Pt transferred downhill to reclining back manual w/c with +2 assist (wife stabilizing board) with increased ease of scooting downhill. Educated pt on w/c propulsion with L hemi technique and pt propelled w/c x 90 ft with supervision and extra time with rest breaks PRN; therapist applied theraband to L drive wheel for tactile feedback. Pt with minimal ability to reach back for bigger push on wheel possibly 2/2 hx of shoulder arthritis. Pt's wife Romie Minus) & granddaughter present for session. Romie Minus reports pt was unable to tolerate sitting in manual w/c for prolonged time yesterday 2/2 discomfort. Pt agreeable to attempt to sit in reclining back manual  w/c through lunch (~1 hour) then return to bed for nap prior to next treatment session. Educated pt on ability for nurses to get him back in bed sooner if uncomfortable. Therapist educated pt's wife on how to thicken drinks, demonstrating with pt's ensure. Pt left in w/c with quick release belt donned, family present to supervise, needs in reach.  Therapy Documentation Precautions:  Precautions Precautions: Fall, Other (comment) Precaution Comments: R hemi, lateral lean and pushes to the R; Cortrak Required Braces or Orthoses: Other Brace/Splint Other Brace/Splint: bilateral upright AFO's (used PTA. Has h/o charcot foot on L) Restrictions Weight Bearing Restrictions: No   See Function Navigator for Current Functional Status.   Therapy/Group: Individual Therapy  [name] 09/24/2017, 12:07 PM

## 2017-09-24 NOTE — Progress Notes (Signed)
Subjective/Complaints: Patient had a difficult night.  Had confusion and anxiety.  He felt that somebody was "trying to kill him" although he realizes that was not reality this morning.  Restoril dose was increased last night  ROS: Patient denies fever, rash, sore throat, blurred vision, nausea, vomiting, diarrhea, cough, shortness of breath or chest pain, joint or back pain, headache.    Objective: Vital Signs: Blood pressure (!) 148/96, pulse 87, temperature 97.9 F (36.6 C), temperature source Oral, resp. rate 16, height 6\' 2"  (1.88 m), weight 98.1 kg (216 lb 4.3 oz), SpO2 96 %. No results found. No results found for this or any previous visit (from the past 72 hour(s)).   Constitutional: No distress . Vital signs reviewed. HEENT: EOMI, oral membranes dry.  NG tube in place Neck: supple Cardiovascular: RRR without murmur. No JVD    Respiratory: CTA Bilaterally without wheezes or rales. Normal effort    GI: BS +, non-tender, non-distended  Skin:   Other IV sites and nares CDI Neuro: Alert/Oriented, Abnormal Sensory Reduced bilateral below ankles, Abnormal Motor 0/5 RUE and RLE and very dysarthric Musc/Skel:  Other mild pain with Right shoulder ext rotation, neg impingement sign Psych: Slightly anxious  Assessment/Plan: 1. 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 Physiatrist is providing close team supervision and 24 hour management of active medical problems listed below. Physiatrist and rehab team continue to assess barriers to discharge/monitor patient progress toward functional and medical goals. FIM: Function - Bathing Position: Bed(NIght bath total A) Body parts bathed by helper: Right arm, Front perineal area, Right lower leg, Left lower leg, Buttocks, Left arm, Abdomen, Right upper leg, Chest, Back, Left upper leg Assist Level: 2  helpers  Function- Upper Body Dressing/Undressing What is the patient wearing?: Pull over shirt/dress Pull over shirt/dress - Perfomed by patient: Pull shirt over trunk, Put head through opening Pull over shirt/dress - Perfomed by helper: Thread/unthread left sleeve, Thread/unthread right sleeve, Put head through opening Assist Level: Touching or steadying assistance(Pt > 75%) Function - Lower Body Dressing/Undressing What is the patient wearing?: Pants Position: Bed Pants- Performed by helper: Thread/unthread right pants leg, Thread/unthread left pants leg, Pull pants up/down Socks - Performed by helper: Don/doff right sock, Don/doff left sock Shoes - Performed by helper: Don/doff right shoe, Don/doff left shoe, Fasten right, Fasten left AFO - Performed by helper: Don/doff right AFO, Don/doff left AFO TED Hose - Performed by helper: Don/doff right TED hose, Don/doff left TED hose Assist for footwear: Maximal assist Assist for lower body dressing: 2 Helpers  Function - Toileting Toileting activity did not occur: Safety/medical concerns Toileting steps completed by helper: Adjust clothing prior to toileting, Performs perineal hygiene, Adjust clothing after toileting Toileting Assistive Devices: Other (comment)(Using [employer]) Assist level: Two helpers  Function - Air cabin crew transfer activity did not occur: Safety/medical concerns Toilet transfer assistive device: Facilities manager lift: Garment/textile technologist level to toilet: 2 helpers Assist level from toilet: 2 helpers  Function - Chair/bed transfer Chair/bed transfer [place]: Lateral scoot Chair/bed transfer assist level: 2 helpers(mod assist +2) Chair/bed transfer assistive device: Sliding board Chair/bed transfer details: Tactile cues for sequencing, Tactile cues for weight shifting, Tactile cues for initiation, Verbal cues for technique, Manual facilitation for placement, Manual facilitation for weight shifting,  Verbal cues for sequencing  Function - Locomotion: Wheelchair Will patient use wheelchair at discharge?: Yes Type: Manual Assist Level:  Dependent (Pt equals 0%)(TIS w/c) Assist Level: Dependent (Pt equals 0%) Assist Level: Dependent (Pt equals 0%) Turns around,maneuvers to table,bed, and toilet,negotiates 3% grade,maneuvers on rugs and over doorsills: No Function - Locomotion: Ambulation Ambulation activity did not occur: Safety/medical concerns Walk 10 feet activity did not occur: Safety/medical concerns Walk 50 feet with 2 turns activity did not occur: Safety/medical concerns Walk 150 feet activity did not occur: Safety/medical concerns Walk 10 feet on uneven surfaces activity did not occur: Safety/medical concerns  Function - Comprehension Comprehension: Auditory Comprehension assistive device: Hearing aids Comprehension assist level: Follows basic conversation/direction with extra time/assistive device  Function - Expression Expression: Verbal Expression assist level: Expresses basic 75 - 89% of the time/requires cueing 10 - 24% of the time. Needs helper to occlude trach/needs to repeat words.  Function - Social Interaction Social Interaction assist level: Interacts appropriately 90% of the time - Needs monitoring or encouragement for participation or interaction.  Function - Problem Solving Problem solving assist level: Solves basic 90% of the time/requires cueing < 10% of the time  Function - Memory Memory assistive device: Memory book Memory assist level: Recognizes or recalls 75 - 89% of the time/requires cueing 10 - 24% of the time Patient normally able to recall (first 3 days only): Current season, Staff names and faces, That he or she is in a hospital  Medical Problem List and Plan: 1.  Right side weakness with dysarthria secondary to multiple bilateral infarcts (left BG, tiny Right frontal and bilateral  occipital infarcts) consistent with cardiac emboli with history  of atrial fibrillation.Onset 5/29 Continue CIR PT OT speech 2.  DVT Prophylaxis/Anticoagulation: Eliquis 3. Pain Management/polyneuropathy: Tylenol as needed 4. Mood/sleep: BuSpar 10 mg twice daily,   -increased confusion with 15mg  restoril---dc  -trial of low dose seroquel at 9pm nightly  -check  ucx for infectious cause of confusion  -spoke to pt/daughter 5. Neuropsych: This patient is capable of making decisions on his own behalf. 6. Skin/Wound Care: Routine skin checks 7. Fluids/Electrolytes/Nutrition: BMET normal 6/4  -Appreciate dietary consult  8.  Dysphasia.  Dysphasia #2 nectar liquids.Cortrak tube feeds for nutritional support.  Check calorie counts.  Follow-up speech therapy Poor appetite , trial Megace ongoing H20 boluses 9.  History of atrial fibrillation.  Continue Eliquis 5mg  BID.  Cardiac rate controlled, on metoprolol PTA 10.  Hyperlipidemia.  Zocor on lipitor PTA 11.  History of polyneuropathy.  Patient uses bilateral AFO braces prior to admission.  No up with her deficits however 12.  Hx HTN lasix and Lisinopril PTA Vitals:   09/23/17 1937 09/24/17 0616  BP: (!) 145/73 (!) 148/96  Pulse: 79 87  Resp: 18 16  Temp: 98 F (36.7 C) 97.9 F (36.6 C)  SpO2: 96% 96%  BPs labile still permissive  13.  Hx of GERD-  Pepcid to 40mg  BID, added protonix 40mg  daily 14.  Nausea without vomiting, question medication versus tube feeding versus constipation.   Some improvement 15.  Constipation improving per pt 16.  Urinary retention likely multifactorial immobility and prob BPH,   flomax increased to home dose 0.8mg   -check ucx today   LOS (Days) 5 A FACE TO FACE EVALUATION WAS PERFORMED  Meredith Staggers 09/24/2017, 9:06 AM

## 2017-09-25 ENCOUNTER — Inpatient Hospital Stay (HOSPITAL_COMMUNITY): Payer: Medicare Other | Admitting: Physical Therapy

## 2017-09-25 MED ORDER — QUETIAPINE FUMARATE 25 MG PO TABS
25.0000 mg | ORAL_TABLET | Freq: Every day | ORAL | Status: DC
  Administered 2017-09-25: 25 mg via ORAL
  Filled 2017-09-25: qty 1

## 2017-09-25 MED ORDER — SORBITOL 70 % SOLN
30.0000 mL | Status: AC
Start: 2017-09-25 — End: 2017-09-25
  Administered 2017-09-25: 30 mL via ORAL
  Filled 2017-09-25: qty 30

## 2017-09-25 MED ORDER — ALPRAZOLAM 0.25 MG PO TABS
0.2500 mg | ORAL_TABLET | Freq: Two times a day (BID) | ORAL | Status: DC | PRN
  Administered 2017-09-25 – 2017-09-27 (×4): 0.25 mg via ORAL
  Filled 2017-09-25 (×5): qty 1

## 2017-09-25 MED ORDER — POLYETHYLENE GLYCOL 3350 17 G PO PACK: 17.0000 g | PACK | Freq: Every day | ORAL | Status: DC | PRN

## 2017-09-25 MED ORDER — QUETIAPINE FUMARATE 25 MG PO TABS
12.5000 mg | ORAL_TABLET | Freq: Every evening | ORAL | Status: DC | PRN
  Administered 2017-09-26: 12.5 mg via ORAL
  Filled 2017-09-25: qty 1

## 2017-09-25 MED ORDER — CEPHALEXIN 250 MG PO CAPS
250.0000 mg | ORAL_CAPSULE | Freq: Three times a day (TID) | ORAL | Status: DC
  Administered 2017-09-25: 250 mg via ORAL
  Filled 2017-09-25: qty 1

## 2017-09-25 MED ORDER — AMOXICILLIN 250 MG PO CAPS
250.0000 mg | ORAL_CAPSULE | Freq: Three times a day (TID) | ORAL | Status: DC
  Administered 2017-09-25 – 2017-10-03 (×25): 250 mg via ORAL
  Filled 2017-09-25 (×26): qty 1

## 2017-09-25 NOTE — Progress Notes (Signed)
Physical Therapy Session Note  Patient Details  Name: Walter Bryant MRN: [id number] Date of Birth: Nov 02, 1930  Today's Date: 09/25/2017 PT Individual Time: 1100-1155 PT Individual Time Calculation (min): 55 min   Short Term Goals: Week 1:  PT Short Term Goal 1 (Week 1): Pt will be able to demonstrate reorientation to midline without UE support with mod assist PT Short Term Goal 2 (Week 1): Pt will be able to perform slideboard transfer with +2 assist (pt = 25%) PT Short Term Goal 3 (Week 1): Pt will be able to initiate w/c mobility  Skilled Therapeutic Interventions/Progress Updates: Pt received supine in bed, denies pain but reports "mental pain" and very fatigued. Agreeable to treatment. BLE TEDs and shoes with AFOs donned totalA. Supine>sit modA with HOB elevated and bedrails. Lateral scoot maxA bed>w/c with transfer board; cues for anterior weight shift leaning head towards L knee to facilitate head/hips relationship and improve efficiency of scoot. Sit >stand at [employer] rail x2 trials with maxA; provided mirror for visual feedback on second trial with improved success. Demo'ed to pt anterior weight shift to move COM over BOS, improved with repetition. Third trial in standing, pt takes 4 steps with mod/maxA; maxA for stance RLE while progressing L. Fatigues very quickly and requires prolonged rest break to recover labored breathing; O2 assessed 96% HR 85bpm. Returned to room totalA. Pt's wife requesting pt remain in tilt in space w/c to rest rather than recliner; reports he has gotten into recliner before and then as soon as staff leaves he feels uncomfortable. Performed R hamstring PROM x2 min; flexor withdraw noted at end range. Remained reclined in w/c at end of session, all needs in reach.      Therapy Documentation Precautions:  Precautions Precautions: Fall, Other (comment) Precaution Comments: R hemi, lateral lean and pushes to the R; Cortrak Required Braces or Orthoses: Other  Brace/Splint Other Brace/Splint: bilateral upright AFO's (used PTA. Has h/o charcot foot on L) Restrictions Weight Bearing Restrictions: No Pain: Pain Assessment Pain Score: 0-No pain   See Function Navigator for Current Functional Status.   Therapy/Group: Individual Therapy  Luberta Mutter 09/25/2017, 12:09 PM

## 2017-09-25 NOTE — Progress Notes (Signed)
Subjective/Complaints: Patient still restless night last night but improved.  Slept 3 hours or so per RN but remained restless and confused otherwise overnight.  No issues tolerating the Seroquel.  Wife reports constipation  ROS: Limited due to cognitive/behavioral   Objective: Vital Signs: Blood pressure (!) 149/75, pulse 74, temperature 98.2 F (36.8 C), temperature source Oral, resp. rate 18, height 6\' 2"  (1.88 m), weight 98.1 kg (216 lb 4.3 oz), SpO2 93 %. No results found. No results found for this or any previous visit (from the past 72 hour(s)).   Constitutional: No distress . Vital signs reviewed. HEENT: EOMI, oral membranes moist Neck: supple Cardiovascular: RRR without murmur. No JVD    Respiratory: CTA Bilaterally without wheezes or rales. Normal effort    GI: BS +, non-tender, non-distended  Skin:   Other IV sites and nares CDI Neuro:  Alert, follows simple commands.  Decreased insight and awareness., Abnormal Sensory Reduced bilateral below ankles, Abnormal Motor 0/5 RUE and RLE and very dysarthric Musc/Skel:  Other mild pain with Right shoulder ext rotation, neg impingement sign Psych: Remains restless/anxious  Assessment/Plan: 1. 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 Physiatrist is providing close team supervision and 24 hour management of active medical problems listed below. Physiatrist and rehab team continue to assess barriers to discharge/monitor patient progress toward functional and medical goals. FIM: Function - Bathing Position: Bed(NIght bath total A) Body parts bathed by helper: Right arm, Front perineal area, Right lower leg, Left lower leg, Buttocks, Left arm, Abdomen, Right upper leg, Chest, Back, Left upper leg Assist Level: 2 helpers  Function- Upper Body Dressing/Undressing What is the patient wearing?: Pull over  shirt/dress Pull over shirt/dress - Perfomed by patient: Pull shirt over trunk, Put head through opening Pull over shirt/dress - Perfomed by helper: Thread/unthread left sleeve, Thread/unthread right sleeve, Put head through opening Assist Level: Touching or steadying assistance(Pt > 75%) Function - Lower Body Dressing/Undressing What is the patient wearing?: Pants Position: Bed Pants- Performed by helper: Thread/unthread right pants leg, Thread/unthread left pants leg, Pull pants up/down Socks - Performed by helper: Don/doff right sock, Don/doff left sock Shoes - Performed by helper: Don/doff right shoe, Don/doff left shoe, Fasten right, Fasten left AFO - Performed by helper: Don/doff right AFO, Don/doff left AFO TED Hose - Performed by helper: Don/doff right TED hose, Don/doff left TED hose Assist for footwear: Maximal assist Assist for lower body dressing: 2 Helpers  Function - Toileting Toileting activity did not occur: Safety/medical concerns Toileting steps completed by helper: Adjust clothing prior to toileting, Performs perineal hygiene, Adjust clothing after toileting Toileting Assistive Devices: Other (comment)(Using [employer]) Assist level: Two helpers  Function - Air cabin crew transfer activity did not occur: Safety/medical concerns Toilet transfer assistive device: Facilities manager lift: Garment/textile technologist level to toilet: 2 helpers Assist level from toilet: 2 helpers  Function - Chair/bed transfer Chair/bed transfer [place]: Lateral scoot Chair/bed transfer assist level: Maximal assist (Pt 25 - 49%/lift and lower) Chair/bed transfer assistive device: Sliding board Chair/bed transfer details: Tactile cues for sequencing, Tactile cues for weight shifting, Tactile cues for posture, Tactile cues for placement, Verbal cues for technique, Verbal cues for sequencing, Manual facilitation for placement, Manual facilitation for weight shifting  Function - Locomotion:  Wheelchair Will patient use wheelchair at discharge?: Yes Type: Manual Max wheelchair distance: 90 ft  Assist Level: Supervision or verbal cues Assist  Level: Supervision or verbal cues Assist Level: Dependent (Pt equals 0%) Turns around,maneuvers to table,bed, and toilet,negotiates 3% grade,maneuvers on rugs and over doorsills: No Function - Locomotion: Ambulation Ambulation activity did not occur: Safety/medical concerns Walk 10 feet activity did not occur: Safety/medical concerns Walk 50 feet with 2 turns activity did not occur: Safety/medical concerns Walk 150 feet activity did not occur: Safety/medical concerns Walk 10 feet on uneven surfaces activity did not occur: Safety/medical concerns  Function - Comprehension Comprehension: Auditory Comprehension assistive device: Hearing aids Comprehension assist level: Follows basic conversation/direction with extra time/assistive device  Function - Expression Expression: Verbal Expression assist level: Expresses basic 75 - 89% of the time/requires cueing 10 - 24% of the time. Needs helper to occlude trach/needs to repeat words.  Function - Social Interaction Social Interaction assist level: Interacts appropriately 90% of the time - Needs monitoring or encouragement for participation or interaction.  Function - Problem Solving Problem solving assist level: Solves basic 90% of the time/requires cueing < 10% of the time  Function - Memory Memory assistive device: Memory book Memory assist level: Recognizes or recalls 75 - 89% of the time/requires cueing 10 - 24% of the time Patient normally able to recall (first 3 days only): Current season, Staff names and faces, That he or she is in a hospital  Medical Problem List and Plan: 1.  Right side weakness with dysarthria secondary to multiple bilateral infarcts (left BG, tiny Right frontal and bilateral  occipital infarcts) consistent with cardiac emboli with history of atrial  fibrillation.Onset 5/29 Continue CIR PT OT speech 2.  DVT Prophylaxis/Anticoagulation: Eliquis 3. Pain Management/polyneuropathy: Tylenol as needed 4. Mood/sleep/confusion: BuSpar 10 mg twice daily,   -increased confusion with 15mg  restoril--- this was stopped on 6/8  -Slept a few hours last night on Seroquel 12.5 mg.  Increase to 25 mg tonight with a back-up 12.5 mg dose  -Urine culture still pending  -spoke to pt and wife  -Check blood work tomorrow morning  -hold megace and baclofen for now 5. Neuropsych: This patient is capable of making decisions on his own behalf. 6. Skin/Wound Care: Routine skin checks 7. Fluids/Electrolytes/Nutrition: BMET normal 6/4  -Appreciate dietary consult  8.  Dysphasia.  Dysphasia #2 nectar liquids.Cortrak tube feeds for nutritional support.  Check calorie counts.  Follow-up speech therapy    -trial Megace ongoing with sluggish results so far. With AMS above, will go ahead and hold for now as well   -H20 boluses 9.  History of atrial fibrillation.  Continue Eliquis 5mg  BID.  Cardiac rate controlled, on metoprolol PTA 10.  Hyperlipidemia.  Zocor on lipitor PTA 11.  History of polyneuropathy.  Patient uses bilateral AFO braces prior to admission.  No up with her deficits however 12.  Hx HTN lasix and Lisinopril PTA Vitals:   09/24/17 2020 09/25/17 0622  BP: (!) 143/82 (!) 149/75  Pulse: 75 74  Resp: 16 18  Temp: 97.8 F (36.6 C) 98.2 F (36.8 C)  SpO2: 97% 93%  BPs fair to borderline 13.  Hx of GERD-  Pepcid to 40mg  BID, added protonix 40mg  daily 14.  Nausea without vomiting, question medication versus tube feeding versus constipation.  Appears to have improved 15.  Constipation improving per pt 16.  Urinary retention likely multifactorial immobility and prob BPH,   flomax increased to home dose 0.8mg   -Urine culture pending   LOS (Days) 6 A FACE TO FACE EVALUATION WAS PERFORMED  Meredith Staggers 09/25/2017, 9:03 AM

## 2017-09-25 NOTE — Progress Notes (Signed)
Patient still a little bit restless last night trying to remove his diaper and pillows. Patient slept approximately 3 hours total. RN removed his shirt to see if he can be more comfortable and he started removing his shorts as well. Covered him with blanket and repositioned to his side.

## 2017-09-26 ENCOUNTER — Encounter (HOSPITAL_COMMUNITY): Payer: Medicare Other | Admitting: Psychology

## 2017-09-26 ENCOUNTER — Inpatient Hospital Stay (HOSPITAL_COMMUNITY): Payer: Medicare Other | Admitting: Speech Pathology

## 2017-09-26 ENCOUNTER — Inpatient Hospital Stay (HOSPITAL_COMMUNITY): Payer: Medicare Other | Admitting: Physical Therapy

## 2017-09-26 ENCOUNTER — Inpatient Hospital Stay (HOSPITAL_COMMUNITY): Payer: Medicare Other | Admitting: Occupational Therapy

## 2017-09-26 LAB — CBC
HEMATOCRIT: 48.4 % (ref 39.0–52.0)
Hemoglobin: 16.1 g/dL (ref 13.0–17.0)
MCH: 31.8 pg (ref 26.0–34.0)
MCHC: 33.3 g/dL (ref 30.0–36.0)
MCV: 95.7 fL (ref 78.0–100.0)
Platelets: 242 10*3/uL (ref 150–400)
RBC: 5.06 MIL/uL (ref 4.22–5.81)
RDW: 14.5 % (ref 11.5–15.5)
WBC: 10.4 10*3/uL (ref 4.0–10.5)

## 2017-09-26 LAB — BASIC METABOLIC PANEL
Anion gap: 9 (ref 5–15)
BUN: 21 mg/dL — ABNORMAL HIGH (ref 6–20)
CHLORIDE: 104 mmol/L (ref 101–111)
CO2: 26 mmol/L (ref 22–32)
CREATININE: 0.9 mg/dL (ref 0.61–1.24)
Calcium: 9.7 mg/dL (ref 8.9–10.3)
GFR calc non Af Amer: >60 mL/min (ref >60)
Glucose, Bld: 152 mg/dL — ABNORMAL HIGH (ref 65–99)
POTASSIUM: 4.3 mmol/L (ref 3.5–5.1)
SODIUM: 139 mmol/L (ref 135–145)

## 2017-09-26 LAB — URINE CULTURE

## 2017-09-26 MED ORDER — MIRTAZAPINE 15 MG PO TBDP
15.0000 mg | ORAL_TABLET | Freq: Every day | ORAL | Status: DC
  Administered 2017-09-26 – 2017-10-02 (×7): 15 mg via ORAL
  Filled 2017-09-26 (×7): qty 1

## 2017-09-26 NOTE — Progress Notes (Signed)
Physical Therapy Weekly Progress Note  Patient Details  Name: Walter Bryant MRN: [id number] Date of Birth: 1931/03/21  Beginning of progress report period: September 20, 2017 End of progress report period: September 26, 2017  Today's Date: 09/26/2017  Patient has met 3 of 3 short term goals.  Pt is making good progress towards all PT goals, as he can complete slide board transfers at times with max assist +1. Pt also has initiated w/c mobility with L hemi technique and requires supervision. Pt requires extra time and frequent rest breaks for all tasks 2/2 significantly reduced activity tolerance and poor endurance. Pt would benefit from continued skilled PT treatment to focus on NMR, transfers, balance, bed mobility, strengthening, endurance training, w/c mobility, and for pt/family education.  Patient continues to demonstrate the following deficits muscle weakness, decreased cardiorespiratory endurance, decreased coordination, decreased attention, decreased awareness, decreased problem solving, decreased safety awareness, decreased memory and delayed processing, and decreased sitting balance, decreased standing balance, decreased postural control, hemiplegia and decreased balance strategies and therefore will continue to benefit from skilled PT intervention to increase functional independence with mobility.  Patient progressing toward long term goals..  Continue plan of care.  PT Short Term Goals Week 1:  PT Short Term Goal 1 (Week 1): Pt will be able to demonstrate reorientation to midline without UE support with mod assist PT Short Term Goal 1 - Progress (Week 1): Met PT Short Term Goal 2 (Week 1): Pt will be able to perform slideboard transfer with +2 assist (pt = 25%) PT Short Term Goal 2 - Progress (Week 1): Met PT Short Term Goal 3 (Week 1): Pt will be able to initiate w/c mobility PT Short Term Goal 3 - Progress (Week 1): Met Week 2:  PT Short Term Goal 1 (Week 2): Pt will ambulate 10 ft with  max assist with therapy only, for strengthening & NMR. PT Short Term Goal 2 (Week 2): Pt will tolerate standing x 10 minutes for weight bearing and NMR. PT Short Term Goal 3 (Week 2): Pt will propel w/c 125 ft with supervision.   Therapy Documentation Precautions:  Precautions Precautions: Fall, Other (comment) Precaution Comments: R hemi, lateral lean and pushes to the R; Cortrak Required Braces or Orthoses: Other Brace/Splint Other Brace/Splint: bilateral upright AFO's (used PTA. Has h/o charcot foot on L) Restrictions Weight Bearing Restrictions: No   See Function Navigator for Current Functional Status.  Therapy/Group: Individual Therapy  [name] 09/26/2017, 8:23 AM

## 2017-09-26 NOTE — Progress Notes (Signed)
Physical Therapy Session Note  Patient Details  Name: Walter Bryant MRN: [id number] Date of Birth: 1931/02/11  Today's Date: 09/26/2017 PT Individual Time: 6578-4696 and 2952-8413 PT Individual Time Calculation (min): 62 min and 40 min  Short Term Goals: Week 2:  PT Short Term Goal 1 (Week 2): Pt will ambulate 10 ft with max assist with therapy only, for strengthening & NMR. PT Short Term Goal 2 (Week 2): Pt will tolerate standing x 10 minutes for weight bearing and NMR. PT Short Term Goal 3 (Week 2): Pt will propel w/c 125 ft with supervision.  Skilled Therapeutic Interventions/Progress Updates:  Treatment 1: Pt received in bed & agreeable to tx. Pt lethargic and more delayed with responses today compared to Saturday - RN aware & reporting pt isn't resting at night, therapist requested sleep chart put on door. Therapist provided total assist for donning shorts bed level as pt required +2 for rolling L<>R and pt with delayed ability to follow commands to position LE/UE to assist with task. Pt transferred sidelying>sitting EOB with +2 assist and completed all slide board transfers bed>w/c and w/c<>mat table with +2 assist. Pt with great difficulty anteriorly shifting weight, demonstrating proper head/hips relationship, and weight shifting across board even with MAX multimodal cuing therefore pt requires +2 assist for slide board transfer. In gym pt sat on EOM with +1 close supervision<>min assist with BLE supported. Pt engaged in reaching with LUE to focus on sitting balance. Pt with more difficulty returning to midline on this date and with posterior lean. Focused on anterior weight shifting with sheet to promote anterior pelvic tilt, pt with improving ability to shift forward as task progressed. Transitioned to standing with use of parallel bars & +2 max assist; pt with improving ability to pull to standing and shifting pelvis anteriorly to assist with upright posture but still requires max  multimodal cuing. Pt able to stand <30 seconds x 2 trials. Pt declined standing a 3rd time, therefore returned to w/c in same manner. At end of session pt left in TIS w/c with seat belt donned, wife present to supervise. Pt missed 13 minutes of skilled PT treatment 2/2 fatigue.   Treatment 2: Pt received in TIS w/c & agreeable to tx, pt oriented to location & situation but remains confused overall. No c/o pain reported. Pt's wife encouraged to take break during session as she appears emotionally stressed due to pt's status today. Transported pt to dayroom via w/c total assist and pt utilized nu-step from w/c level with task focusing on BLE NMR & strengthening; pt required rest breaks between sets 2/2 fatigue. Transitioned to standing frame & pt tolerated standing 4 minutes + <1 minutes with task focusing on weight bearing through BLE for NMR & strengthening, as well as midline orientation. Attempted to use mirror for visual postural feedback but pt with little attention to it. Attempted to have pt reach to L to focus on weight shifting pelvis & shoulders to L to prevent R lateral lean but pt unable to shift weight. Pt also with significant trunk flexion and difficulty shifting pelvis anteriorly to achieve full upright standing. During 2nd standing trial pt reported need to have BM. Pt returned to w/c and returned to room and assisted to bed via slide board +2 assist. At end of session pt left supine in bed in care of NT.  Pt appears exhausted during session but is agreeable and motivated to participate.    Therapy Documentation Precautions:  Precautions Precautions: Fall,  Other (comment) Precaution Comments: R hemi, lateral lean and pushes to the R; Cortrak Required Braces or Orthoses: Other Brace/Splint Other Brace/Splint: bilateral upright AFO's (used PTA. Has h/o charcot foot on L) Restrictions Weight Bearing Restrictions: No  General: PT Amount of Missed Time (min): 13 Minutes PT Missed  Treatment Reason: Patient fatigue   See Function Navigator for Current Functional Status.   Therapy/Group: Individual Therapy  [name] 09/26/2017, 3:59 PM

## 2017-09-26 NOTE — Progress Notes (Signed)
Patient continues to be restless during the night trying to get out of bed and removing his diaper.  Xanax given with some relief. Patient was able to sleep 5 hours total this shift. Also, patient seem more comfortable without clothing on during the night. Patient also had episodes of incontinence twice. Bladder scan this morning at 4 am was 245 CC.

## 2017-09-26 NOTE — Progress Notes (Signed)
Speech Language Pathology Daily Session Note  Patient Details  Name: Walter Bryant MRN: [id number] Date of Birth: 1931/04/01  Today's Date: 09/26/2017 SLP Individual Time: 0900-0930 SLP Individual Time Calculation (min): 30 min  Short Term Goals: Week 1: SLP Short Term Goal 1 (Week 1): Pt will consume thin liquid/ice cups trials via cup sips with min overt s/s aspiration to demonstrate readiness of instrumental swallow assessment. SLP Short Term Goal 2 (Week 1): Pt will consume dys 3 trials with efficient mastication and oral clearnace without overt s/s aspiration to demonstrate readiness of solid upgrade. SLP Short Term Goal 3 (Week 1): Pt will recall new, daily information with Min A verbal cues for external aids. SLP Short Term Goal 4 (Week 1): Pt will demonstrate 90% intelligibility at sentence level with Mod A verbal cues for use of speech intelligibility strategies.  SLP Short Term Goal 4 - Progress (Week 1): Updated due to goal met SLP Short Term Goal 5 (Week 1): Pt will demonstrate functional problem solving in semi-complex tasks with supervision A verbal cues.  Skilled Therapeutic Interventions: Skilled treatment session focused on cognition goals. SLP received pt in bed and it was obvious that pt was very confused. He was perseverative on nonsensical topics (such as his bed being new which it wasn't). Pt required total A to Max A multimodal cues for bed mobility to reposition pt in hopes that it would decrease his perseveration on bed. Once this topic was addressed, pt perseverated on something else. His wife was very distraught, support and education provided. Pt was not able to focus on IMST trainer and he continued with open mouth breathing (wife states related to anxiety). No trials given as pt was not able to follow multimodal cues to slow RR. Pt with decreased speech intelligibility to ~ 50 % at the phrase level. MD is aware and changing medicine as well as treating UTI. Pt left  upright in bed, bed alarm on and all 4 rails up. Continue per current plan of care.      Function:    Cognition Comprehension Comprehension assist level: Understands basic 50 - 74% of the time/ requires cueing 25 - 49% of the time;Understands basic 25 - 49% of the time/ requires cueing 50 - 75% of the time  Expression   Expression assist level: Expresses basic 50 - 74% of the time/requires cueing 25 - 49% of the time. Needs to repeat parts of sentences.;Expresses basic 25 - 49% of the time/requires cueing 50 - 75% of the time. Uses single words/gestures.  Social Interaction Social Interaction assist level: Interacts appropriately 50 - 74% of the time - May be physically or verbally inappropriate.  Problem Solving Problem solving assist level: Solves basic less than 25% of the time - needs direction nearly all the time or does not effectively solve problems and may need a restraint for safety;Solves basic 25 - 49% of the time - needs direction more than half the time to initiate, plan or complete simple activities  Memory Memory assist level: Recognizes or recalls less than 25% of the time/requires cueing greater than 75% of the time    Pain Pain Assessment Pain Scale: 0-10 Pain Score: 0-No pain  Therapy/Group: Individual Therapy  Serrina Minogue 09/26/2017, 11:18 AM

## 2017-09-26 NOTE — Progress Notes (Signed)
Occupational Therapy Session Note  Patient Details  Name: Walter Bryant MRN: [id number] Date of Birth: 1930/07/04  Today's Date: 09/26/2017 OT Individual Time: 1300-1415 OT Individual Time Calculation (min): 75 min    Short Term Goals: Week 1:  OT Short Term Goal 1 (Week 1): Pt will perform slide board transfer bed<>chair with max A+2 with initiation of foward weight shift OT Short Term Goal 2 (Week 1): Pt will don shirt with max A  OT Short Term Goal 3 (Week 1): Pt will maintain static sitting balance with min A for ~5 min in prep for functional tasks  OT Short Term Goal 4 (Week 1): Pt will roll in bed to the right with mod A to help with LB clothing management and hygiene  Skilled Therapeutic Interventions/Progress Updates:    Pt seen for OT session focusing on neuro re-ed with WBing and e-stim as well as self care. Pt in supine upon arrival, alert and agreeable to tx session. He was oriented x4 with no signs of confusion.  He transferred to EOB with max A +1 using hospital bed functions. B shoes and AFOs donned total A with +2 required for sitting balance EOB. +2 assist required for sliding board transfer, able to assist once able to reach with L hand onto w/c armrest.  In therapy day room, utilizing standing frame for LE WBing, noted some increased R UE tone upon standing. Attempted to complete towel pushes with R UE, however, due to heavy reliance on UEs in standing frame unable to complete towel pushes and therefore used activity for WBing through R UE. Completed x2 trials of standing. During second standing trial, utilized E-stim for wrist and finger extension. See details below.  Pt noted to have been incontinent of bowel upon standing. Therefore returned to room, and utilized SARA plus for standing and hygiene to be completed and new brief donned total A. Pt left recliner in tilt-in-space w/c at end of session, wife present and all needs in reach.   Pt much more lethargic this  session compared to previous sessions.   Therapy Documentation Precautions:  Precautions Precautions: Fall, Other (comment) Precaution Comments: R hemi, lateral lean and pushes to the R; Cortrak Required Braces or Orthoses: Other Brace/Splint Other Brace/Splint: bilateral upright AFO's (used PTA. 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   Therapy/Group: Individual Therapy  Tremaine Fuhriman L 09/26/2017, 7:15 AM

## 2017-09-26 NOTE — Progress Notes (Signed)
Subjective/Complaints: Wife states patient has been confused.  Patient remembers me, oriented to person and place.  His speech has moderate dysarthria but unchanged compared to prior. Discussed with Dr. Naaman Plummer.  Started on antibiotic for UTI over the weekend Temazepam was stopped, started on low-dose Seroquel ROS: Limited due to cognitive/behavioral   Objective: Vital Signs: Blood pressure (!) 151/85, pulse 83, temperature 98.1 F (36.7 C), temperature source Oral, resp. rate 18, height 6' 2"  (1.88 m), weight 98.1 kg (216 lb 4.3 oz), SpO2 98 %. No results found. Results for orders placed or performed during the hospital encounter of 09/19/17 (from the past 72 hour(s))  Urine Culture     Status: Abnormal   Collection Time: 09/24/17  1:30 PM  Result Value Ref Range   Specimen Description URINE, CLEAN CATCH    Special Requests      NONE Performed at [facility], [address], [place], [place] [address]    Culture >=100,000 COLONIES/mL ENTEROCOCCUS FAECALIS (A)    Report Status 09/26/2017 FINAL    Organism ID, Bacteria ENTEROCOCCUS FAECALIS (A)       Susceptibility   Enterococcus faecalis - MIC*    AMPICILLIN <=2 SENSITIVE Sensitive     LEVOFLOXACIN 1 SENSITIVE Sensitive     NITROFURANTOIN <=16 SENSITIVE Sensitive     VANCOMYCIN 1 SENSITIVE Sensitive     * >=100,000 COLONIES/mL ENTEROCOCCUS FAECALIS  Basic metabolic panel     Status: Abnormal   Collection Time: 09/26/17  5:37 AM  Result Value Ref Range   Sodium 139 135 - 145 mmol/L   Potassium 4.3 3.5 - 5.1 mmol/L   Chloride 104 101 - 111 mmol/L   CO2 26 22 - 32 mmol/L   Glucose, Bld 152 (H) 65 - 99 mg/dL   BUN 21 (H) 6 - 20 mg/dL   Creatinine, Ser 0.90 0.61 - 1.24 mg/dL   Calcium 9.7 8.9 - 10.3 mg/dL   GFR calc non Af Amer >60 >60 mL/min   GFR calc Af Amer >60 >60 mL/min    Comment: (NOTE) The eGFR has been calculated using the CKD EPI equation. This calculation has not been validated in all clinical  situations. eGFR's persistently <60 mL/min signify possible Chronic Kidney Disease.    Anion gap 9 5 - 15    Comment: Performed at [facility] [address], [place] [address]  CBC     Status: None   Collection Time: 09/26/17  5:37 AM  Result Value Ref Range   WBC 10.4 4.0 - 10.5 K/uL   RBC 5.06 4.22 - 5.81 MIL/uL   Hemoglobin 16.1 13.0 - 17.0 g/dL   HCT 48.4 39.0 - 52.0 %   MCV 95.7 78.0 - 100.0 fL   MCH 31.8 26.0 - 34.0 pg   MCHC 33.3 30.0 - 36.0 g/dL   RDW 14.5 11.5 - 15.5 %   Platelets 242 150 - 400 K/uL    Comment: Performed at [facility], [place] [address], [place], [place] [address]     Constitutional: No distress . Vital signs reviewed. HEENT: EOMI, oral membranes moist Neck: supple Cardiovascular: RRR without murmur. No JVD    Respiratory: CTA Bilaterally without wheezes or rales. Normal effort    GI: BS +, non-tender, non-distended  Skin:   Other IV sites and nares CDI Neuro:  Alert, follows simple commands.  Decreased insight and awareness., Abnormal Sensory Reduced bilateral below ankles, Abnormal Motor 3-/5 RUE grip, 2- RUE Bicepts  and Triceps and 2- RLE hip/knee extensor synergy as well as plantarflexion.  Moderately dysarthric Musc/Skel:  Other mild pain with Right shoulder ext rotation, neg impingement sign Psych: Remains restless/anxious  Assessment/Plan: 1. 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 Physiatrist is providing close team supervision and 24 hour management of active medical problems listed below. Physiatrist and rehab team continue to assess barriers to discharge/monitor patient progress toward functional and medical goals. FIM: Function - Bathing Position: Bed Body parts bathed by helper: Right arm, Left arm, Chest, Abdomen, Front perineal area, Buttocks, Right upper leg, Left upper leg,  Right lower leg, Left lower leg, Back Assist Level: 2 helpers  Function- Upper Body Dressing/Undressing What is the patient wearing?: (pt. comfortable without clothes on except diaper.) Pull over shirt/dress - Perfomed by patient: Pull shirt over trunk, Put head through opening Pull over shirt/dress - Perfomed by helper: Thread/unthread left sleeve, Thread/unthread right sleeve, Put head through opening Assist Level: Touching or steadying assistance(Pt > 75%) Function - Lower Body Dressing/Undressing What is the patient wearing?: Pants Position: Bed Pants- Performed by helper: Thread/unthread right pants leg, Thread/unthread left pants leg, Pull pants up/down Socks - Performed by helper: Don/doff right sock, Don/doff left sock Shoes - Performed by helper: Don/doff right shoe, Don/doff left shoe, Fasten right, Fasten left AFO - Performed by helper: Don/doff right AFO, Don/doff left AFO TED Hose - Performed by helper: Don/doff right TED hose, Don/doff left TED hose Assist for footwear: Maximal assist Assist for lower body dressing: 2 Helpers  Function - Toileting Toileting activity did not occur: Safety/medical concerns Toileting steps completed by helper: Adjust clothing prior to toileting, Performs perineal hygiene, Adjust clothing after toileting Toileting Assistive Devices: Other (comment)(Using SARA PLus) Assist level: Two helpers  Function - Air cabin crew transfer activity did not occur: Safety/medical concerns Toilet transfer assistive device: Facilities manager lift: Garment/textile technologist level to toilet: 2 helpers Assist level from toilet: 2 helpers  Function - Chair/bed transfer Chair/bed transfer Berger: Lateral scoot Chair/bed transfer assist level: Maximal assist (Pt 25 - 49%/lift and lower) Chair/bed transfer assistive device: Sliding board, Armrests Chair/bed transfer details: Tactile cues for sequencing, Tactile cues for weight shifting, Tactile cues for  posture, Tactile cues for placement, Verbal cues for technique, Verbal cues for sequencing, Manual facilitation for placement, Manual facilitation for weight shifting  Function - Locomotion: Wheelchair Will patient use wheelchair at discharge?: Yes Type: Manual Max wheelchair distance: 90 ft  Assist Level: Supervision or verbal cues Assist Level: Supervision or verbal cues Assist Level: Dependent (Pt equals 0%) Turns around,maneuvers to table,bed, and toilet,negotiates 3% grade,maneuvers on rugs and over doorsills: No Function - Locomotion: Ambulation Ambulation activity did not occur: Safety/medical concerns Assistive device: Rail in hallway Max distance: 2 Assist level: 2 helpers Walk 10 feet activity did not occur: Safety/medical concerns Walk 50 feet with 2 turns activity did not occur: Safety/medical concerns Walk 150 feet activity did not occur: Safety/medical concerns Walk 10 feet on uneven surfaces activity did not occur: Safety/medical concerns  Function - Comprehension Comprehension: Auditory Comprehension assistive device: Hearing aids Comprehension assist level: Follows basic conversation/direction with extra time/assistive device  Function - Expression Expression: Verbal Expression assist level: Expresses basic 75 - 89% of the time/requires cueing 10 - 24% of the time. Needs helper to occlude trach/needs to repeat words.  Function - Social Interaction Social Interaction assist level: Interacts appropriately 90%  of the time - Needs monitoring or encouragement for participation or interaction.  Function - Problem Solving Problem solving assist level: Solves basic 90% of the time/requires cueing < 10% of the time  Function - Memory Memory assistive device: Memory book Memory assist level: Recognizes or recalls 75 - 89% of the time/requires cueing 10 - 24% of the time Patient normally able to recall (first 3 days only): Current season, Staff names and faces, That he or  she is in a hospital  Medical Problem List and Plan: 1.  Right side weakness with dysarthria secondary to multiple bilateral infarcts (left BG, tiny Right frontal and bilateral  occipital infarcts) consistent with cardiac emboli with history of atrial fibrillation.Onset 5/29 Patient's motor strength has been improving over the last couple days.  His upper extremity strength in particular has improved distally greater than proximally. Continue CIR PT OT speech 2.  DVT Prophylaxis/Anticoagulation: Eliquis 3. Pain Management/polyneuropathy: Tylenol as needed 4. Mood/sleep/confusion: BuSpar 10 mg twice daily,   -increased confusion with 81m restoril--- this was stopped on 6/8  -Slept a few hours last night on Seroquel 12.5 mg.  Discontinue standing dose 25 mg tonight with prn 12.5 mg dose  With complaints of anxiety and insomnia as well as decreased appetite would start Remeron  potential history of depression 5. Neuropsych: This patient is capable of making decisions on his own behalf. 6. Skin/Wound Care: Routine skin checks 7. Fluids/Electrolytes/Nutrition: BMET normal 6/4  -Appreciate dietary consult  8.  Dysphasia.  Dysphasia #2 nectar liquids.Cortrak tube feeds for nutritional support.  Check calorie counts.    Repeat modified this week as per speech therapy    -trial Megace ongoing with sluggish results so far. With AMS above, will go ahead and hold for now as well   -H20 boluses 9.  History of atrial fibrillation.  Continue Eliquis 526mBID.  Cardiac rate controlled, on metoprolol PTA 10.  Hyperlipidemia.  Zocor on lipitor PTA 11.  History of polyneuropathy.  Patient uses bilateral AFO braces prior to admission.  No up with her deficits however 12.  Hx HTN lasix and Lisinopril PTA Vitals:   09/25/17 2059 09/26/17 0411  BP: (!) 155/97 (!) 151/85  Pulse: 80 83  Resp: 16 18  Temp:  98.1 F (36.7 C)  SpO2: 97% 98%  BPs fair to borderline 13.  Hx of GERD-  Pepcid to 4058mID, added  protonix 23m34mily, improved 14.  Nausea without vomiting, question medication versus tube feeding versus constipation.  Appears to have improved partially 15.  Constipation improving per pt 16.  Urinary retention likely multifactorial immobility and prob BPH,   flomax increased to home dose 0.8mg,91marting to void incontinently, residual volumes decreasing  -Urine culture positive enterococcus on amoxicillin   LOS (Days) 7 A FACE TO FACE EVALUATION WAS PERFORMED  AndreCharlett Blake/2019, 10:42 AM

## 2017-09-26 NOTE — Plan of Care (Signed)
  Problem: Consults Goal: RH STROKE PATIENT EDUCATION Description See Patient Education module for education specifics  Outcome: Progressing Goal: Nutrition Consult-if indicated Outcome: Progressing   Problem: RH BOWEL ELIMINATION Goal: RH STG MANAGE BOWEL WITH ASSISTANCE Description STG Manage Bowel with moderate Assistance.  Outcome: Progressing Goal: RH STG MANAGE BOWEL W/MEDICATION W/ASSISTANCE Description STG Manage Bowel with Medication with min Assistance.  Outcome: Progressing   Problem: RH BLADDER ELIMINATION Goal: RH STG MANAGE BLADDER WITH EQUIPMENT WITH ASSISTANCE Description STG Manage Bladder With Equipment and With total Assistance  Outcome: Progressing   Problem: RH SKIN INTEGRITY Goal: RH STG SKIN FREE OF INFECTION/BREAKDOWN Description Skin to remain free from breakdown with min assistance while controlling incontinence by toileting patient every 4 hours or more frequently as needed by patient.  Outcome: Progressing Goal: RH STG MAINTAIN SKIN INTEGRITY WITH ASSISTANCE Description STG Maintain Skin Integrity With moderate Assistance.  Outcome: Progressing Goal: RH STG ABLE TO PERFORM INCISION/WOUND CARE W/ASSISTANCE Description STG Able To Perform skin Care With moderate Assistance.  Outcome: Progressing   Problem: RH SAFETY Goal: RH STG ADHERE TO SAFETY PRECAUTIONS W/ASSISTANCE/DEVICE Description STG Adhere to Safety Precautions With moderate Assistance and appropriate assistive Devices.  Outcome: Progressing Goal: RH STG DECREASED RISK OF FALL WITH ASSISTANCE Description STG Decreased Risk of Fall With moderate Assistance while following CIR's new standard toileting protocol of every 4 hours.  Outcome: Progressing   Problem: RH PAIN MANAGEMENT Goal: RH STG PAIN MANAGED AT OR BELOW PT'S PAIN GOAL Description < 3 on a 0-10 pain scale  Outcome: Progressing   Problem: RH KNOWLEDGE DEFICIT Goal: RH STG INCREASE KNOWLEDGE OF  HYPERTENSION Description Patient and spouse will be able to demonstrate knowledge of new HTN medications, including dosage amounts, dosages per day, and follow-up care with the MD post discharge with min assist from rehab staff.  Outcome: Progressing Goal: RH STG INCREASE KNOWLEDGE OF DYSPHAGIA/FLUID INTAKE Description Patient and spouse will be able to demonstrate knowledge of safe swallowing techniques, and proper techniques for thickening liquids with min assist from rehab staff.  Outcome: Progressing   Problem: RH BLADDER ELIMINATION Goal: RH STG MANAGE BLADDER WITH ASSISTANCE Description STG Manage Bladder With moderate Assistance  Outcome: Not Progressing

## 2017-09-26 NOTE — Progress Notes (Signed)
Pt restless and irritable; PRN Seroquel administered with no signs of decrease in behavior; will continue to monitor

## 2017-09-26 NOTE — Progress Notes (Signed)
Attempted to flush IV site to R hand. Unable to flush to occluded. IV pulled. According to [facility] site has been saline locked for several days without use. No c/o pain when removed IV. Wife at bedside. Pt has had anxiety today. Very restless. Xanax given at lunch time and effective but has wore off. Pt attempts to go to sleep and wakes up anxious unable to fall asleep. Will report to oncoming nurse. Call bell in reach and siderails up x 4. Will cont to monitor. Pamella Pert, LPN

## 2017-09-27 ENCOUNTER — Inpatient Hospital Stay (HOSPITAL_COMMUNITY): Payer: Medicare Other

## 2017-09-27 ENCOUNTER — Ambulatory Visit (HOSPITAL_COMMUNITY): Payer: Medicare Other

## 2017-09-27 ENCOUNTER — Inpatient Hospital Stay (HOSPITAL_COMMUNITY): Payer: Medicare Other | Admitting: Occupational Therapy

## 2017-09-27 MED ORDER — QUETIAPINE FUMARATE 25 MG PO TABS
25.0000 mg | ORAL_TABLET | Freq: Every evening | ORAL | Status: DC | PRN
  Administered 2017-09-27 – 2017-10-10 (×12): 25 mg via ORAL
  Filled 2017-09-27 (×12): qty 1

## 2017-09-27 NOTE — Progress Notes (Signed)
Occupational Therapy Session Note  Patient Details  Name: Walter Bryant MRN: [id number] Date of Birth: [birth year]  Today's Date: 09/27/2017 OT Individual Time: [phone] (make up time) OT Individual Time Calculation (min): 24 min    Short Term Goals: Week 2:  OT Short Term Goal 1 (Week 2): Pt will consistently complete sliding board transfers with max A +1 in order to reduce caregiver burden.  OT Short Term Goal 2 (Week 2): Pt will don shirt with mod A seated EOB. OT Short Term Goal 3 (Week 2): Pt will tolerate 5 minutes of therapeutic activity without need for rest break in order to increase functional activity tolerance  Skilled Therapeutic Interventions/Progress Updates:    Upon entering the room, pt in bed on bedpan with wife present in room. Caregiver reports pt having BM and needing assistance. Pt required total A for rolling L <> R for hygiene and clothing management. Pt very fatigued and having difficulty remaining alert during self care. OT repositioned pt in bed for comfort. Bed alarm activated and call bell within reach.   Therapy Documentation Precautions:  Precautions Precautions: Fall, Other (comment) Precaution Comments: R hemi, lateral lean and pushes to the R; Cortrak Required Braces or Orthoses: Other Brace/Splint Other Brace/Splint: bilateral upright AFO's (used PTA. Has h/o charcot foot on L) Restrictions Weight Bearing Restrictions: No General:   Vital Signs: Therapy Vitals Temp: 98.1 F (36.7 C) Temp Source: Oral Pulse Rate: 73 Resp: 19 BP: (!) 143/87 Patient Position (if appropriate): Lying Oxygen Therapy SpO2: 95 % O2 Device: Room Air Pain: Pain Assessment Pain Score: 0-No pain ADL: ADL ADL Comments: see functional navigator  See Function Navigator for Current Functional Status.   Therapy/Group: Individual Therapy  Gypsy Decant 09/27/2017, 3:52 PM

## 2017-09-27 NOTE — Progress Notes (Signed)
Occupational Therapy Weekly Progress Note  Patient Details  Name: Walter Bryant MRN: [id number] Date of Birth: 1930-08-19  Beginning of progress report period: September 20, 2017 End of progress report period: September 27, 2017  Today's Date: 09/27/2017 OT Individual Time: 5374-8270 OT Individual Time Calculation (min): 75 min    Patient has met 3 of 4 short term goals.  Pt is making slow but steady progress towards OT goals. He is completing sliding board transfers with overall max A +2, however, if set-up is conducive, is able to complete transfer with max A +1. He cont to require max A with basic ADLs. Bed mobility improving for bed level ADLs, rolling with mod A overall.  He can fluctuate greatly in performance throughout the day depending on fatigue level and attention. Nursing reports pt does not sleep well at night and some agitation. This is likely affecting his performance with basic ADLs.  Pt's wife and family have been present throughout his rehab admission. They are aware of the physical and cognitive assist pt currently requires. At this time, pt will cont to benefit from IPR services to cont to reduce caregiver burden, however, pt will likely require SNF following IPR as pt's wife unable to provide more than min A.   Patient continues to demonstrate the following deficits:hemiplegia affecting dominant side and muscle weakness (generalized) and therefore will continue to benefit from skilled OT intervention to enhance overall performance with BADL and Reduce care partner burden.  Patient progressing toward long term goals..  Continue plan of care.  OT Short Term Goals Week 1:  OT Short Term Goal 1 (Week 1): Pt will perform slide board transfer bed<>chair with max A+2 with initiation of foward weight shift OT Short Term Goal 1 - Progress (Week 1): Met OT Short Term Goal 2 (Week 1): Pt will don shirt with max A  OT Short Term Goal 2 - Progress (Week 1): Met OT Short Term Goal 3 (Week 1):  Pt will maintain static sitting balance with min A for ~5 min in prep for functional tasks  OT Short Term Goal 3 - Progress (Week 1): Progressing toward goal OT Short Term Goal 4 (Week 1): Pt will roll in bed to the right with mod A to help with LB clothing management and hygiene OT Short Term Goal 4 - Progress (Week 1): Met Week 2:  OT Short Term Goal 1 (Week 2): Pt will consistently complete sliding board transfers with max A +1 in order to reduce caregiver burden.  OT Short Term Goal 2 (Week 2): Pt will don shirt with mod A seated EOB. OT Short Term Goal 3 (Week 2): Pt will tolerate 5 minutes of therapeutic activity without need for rest break in order to increase functional activity tolerance  Skilled Therapeutic Interventions/Progress Updates:    Pt seen for OT session focusing on functional mobility and ADL re-training. Pt in supine upon arrival with wife present. Pt requesting use of bed pan. Pt slightly agitated and declining attempt to get to [facility]. Pt rolled with mod A for bed pan to be placed, HOB elevated for more upright positioning.  Pt unable to void. Completed hygiene and LB dressing from bed level with +2 assist required. He transferred to sitting EOB with total A +2 using hospital bed functions. Pt required mod A for sitting balance EOB while shoes donned due tto posterior bias. +2 max- total A lsiding board transfer completed to w/c.  Pt dressed UB seated in w/c, unable  to recall hemi dressing techniques and requires max multi modal cuing for proper orientation and sequencing of dressing task. Pt agreeable to eating breakfast, total A for set-up of items and min cuing to locate and manage items. Pt left seated in w/c at end of session, w/c seat belt on and wife present.  Ptt lethargic throughout session, keeping eyes closed majority of session and requiring increased time and multimodal cuing 2/2 delayed and decreased processing.    Therapy Documentation Precautions:   Precautions Precautions: Fall, Other (comment) Precaution Comments: R hemi, lateral lean and pushes to the R; Cortrak Required Braces or Orthoses: Other Brace/Splint Other Brace/Splint: bilateral upright AFO's (used PTA. Has h/o charcot foot on L) Restrictions Weight Bearing Restrictions: No Pain:   no/denies pain ADL: ADL ADL Comments: see functional navigator  See Function Navigator for Current Functional Status.   Therapy/Group: Individual Therapy  Walter Bryant L 09/27/2017, 6:56 AM

## 2017-09-27 NOTE — Progress Notes (Signed)
Patient has anxiety about swallowing morning medications. This RN stated that medications could be crushed and pushed through the Cortrac tube. Patient complained of pain in back, given Tylenol 650 mg solution through the Cortrac tube. Will attempt to give afternoon medications PO.

## 2017-09-27 NOTE — Progress Notes (Signed)
Physical Therapy Session Note  Patient Details  Name: Walter Bryant MRN: [id number] Date of Birth: Jun 21, 1930  Today's Date: 09/27/2017 PT Individual Time: 5956-3875 PT Individual Time Calculation (min): 45 min  and Today's Date: 09/27/2017 PT Missed Time: 15 Minutes Missed Time Reason: Patient fatigue  Short Term Goals: Week 2:  PT Short Term Goal 1 (Week 2): Pt will ambulate 10 ft with max assist with therapy only, for strengthening & NMR. PT Short Term Goal 2 (Week 2): Pt will tolerate standing x 10 minutes for weight bearing and NMR. PT Short Term Goal 3 (Week 2): Pt will propel w/c 125 ft with supervision.  Skilled Therapeutic Interventions/Progress Updates:    Pt seated in TIS w/c upon PT arrival, agreeable to therapy tx and denies pain. Pt reports feeling very tired and wife reports that pt did not sleep last night. Pt transported to the gym in w/c. Pt performed slideboard transfer from TIS>mat with total assist +2, manual facilitation for anterior weightshift as pt demonstrates increased posterior pushing. Seated edge of mat pt able to maintain static balance with min assist and dynamic sitting balance with mod-max assist, fluctuating with fatigue level. Pt performed dynamic seated balance reaching activity to facilitate anterior L lateral weightshift reaching for bean bags. Pt with increased fatigue and lethergy, transported back to TIS total assist +2. Pt performed slideboard transfer back to bed total assist +2. Pt transferred to supine with total assist +2. In supine therapist performed B hamstring and B heel cord stretches 2 x 1 min for ROM. Pt left supine in bed with RN and wife present.   Therapy Documentation Precautions:  Precautions Precautions: Fall, Other (comment) Precaution Comments: R hemi, lateral lean and pushes to the R; Cortrak Required Braces or Orthoses: Other Brace/Splint Other Brace/Splint: bilateral upright AFO's (used PTA. Has h/o charcot foot on  L) Restrictions Weight Bearing Restrictions: No   See Function Navigator for Current Functional Status.   Therapy/Group: Individual Therapy  Netta Corrigan, PT, DPT 09/27/2017, 7:55 AM

## 2017-09-27 NOTE — Progress Notes (Signed)
Subjective/Complaints:  Slept poorly per wife, had friend who stayed with pt last noc Discussed that UTI symptoms should subside by now with change of abx  ROS: Limited due to cognitive/behavioral   Objective: Vital Signs: Blood pressure 119/62, pulse 71, temperature 97.8 F (36.6 C), temperature source Oral, resp. rate 18, height 6' 2"  (1.88 m), weight 98.1 kg (216 lb 4.3 oz), SpO2 99 %. No results found. Results for orders placed or performed during the hospital encounter of 09/19/17 (from the past 72 hour(s))  Urine Culture     Status: Abnormal   Collection Time: 09/24/17  1:30 PM  Result Value Ref Range   Specimen Description URINE, CLEAN CATCH    Special Requests      NONE Performed at [facility], [address] [place], [place] [address]    Culture >=100,000 COLONIES/mL ENTEROCOCCUS FAECALIS (A)    Report Status 09/26/2017 FINAL    Organism ID, Bacteria ENTEROCOCCUS FAECALIS (A)       Susceptibility   Enterococcus faecalis - MIC*    AMPICILLIN <=2 SENSITIVE Sensitive     LEVOFLOXACIN 1 SENSITIVE Sensitive     NITROFURANTOIN <=16 SENSITIVE Sensitive     VANCOMYCIN 1 SENSITIVE Sensitive     * >=100,000 COLONIES/mL ENTEROCOCCUS FAECALIS  Basic metabolic panel     Status: Abnormal   Collection Time: 09/26/17  5:37 AM  Result Value Ref Range   Sodium 139 135 - 145 mmol/L   Potassium 4.3 3.5 - 5.1 mmol/L   Chloride 104 101 - 111 mmol/L   CO2 26 22 - 32 mmol/L   Glucose, Bld 152 (H) 65 - 99 mg/dL   BUN 21 (H) 6 - 20 mg/dL   Creatinine, Ser 0.90 0.61 - 1.24 mg/dL   Calcium 9.7 8.9 - 10.3 mg/dL   GFR calc non Af Amer >60 >60 mL/min   GFR calc Af Amer >60 >60 mL/min    Comment: (NOTE) The eGFR has been calculated using the CKD EPI equation. This calculation has not been validated in all clinical situations. eGFR's persistently <60 mL/min signify possible Chronic Kidney Disease.    Anion gap 9 5 - 15    Comment: Performed at [facility] [address], [place] [address]  CBC     Status: None   Collection Time: 09/26/17  5:37 AM  Result Value Ref Range   WBC 10.4 4.0 - 10.5 K/uL   RBC 5.06 4.22 - 5.81 MIL/uL   Hemoglobin 16.1 13.0 - 17.0 g/dL   HCT 48.4 39.0 - 52.0 %   MCV 95.7 78.0 - 100.0 fL   MCH 31.8 26.0 - 34.0 pg   MCHC 33.3 30.0 - 36.0 g/dL   RDW 14.5 11.5 - 15.5 %   Platelets 242 150 - 400 K/uL    Comment: Performed at [facility], [place] [address], [place], [place] [address]     Constitutional: No distress . Vital signs reviewed. HEENT: EOMI, oral membranes moist Neck: supple Cardiovascular: RRR without murmur. No JVD    Respiratory: CTA Bilaterally without wheezes or rales. Normal effort    GI: BS +, non-tender, non-distended  Skin:   Other IV sites and nares CDI Neuro:  Alert, follows simple commands.  Decreased insight and awareness., Abnormal Sensory Reduced bilateral below ankles, Abnormal Motor 3-/5 RUE grip, 2- RUE Bicepts and Triceps and 2- RLE hip/knee extensor synergy as well as plantarflexion.  Moderately dysarthric Musc/Skel:  Other mild pain with  Right shoulder ext rotation, neg impingement sign Psych: Remains restless/anxious  Assessment/Plan: 1. 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 Physiatrist is providing close team supervision and 24 hour management of active medical problems listed below. Physiatrist and rehab team continue to assess barriers to discharge/monitor patient progress toward functional and medical goals. FIM: Function - Bathing Position: Bed Body parts bathed by helper: Right arm, Left arm, Chest, Abdomen, Front perineal area, Buttocks, Right upper leg, Left upper leg, Right lower leg, Left lower leg, Back Assist Level: 2 helpers  Function- Upper Body Dressing/Undressing What is the patient wearing?: (pt. comfortable without clothes on  except diaper.) Pull over shirt/dress - Perfomed by patient: Pull shirt over trunk, Put head through opening Pull over shirt/dress - Perfomed by helper: Thread/unthread left sleeve, Thread/unthread right sleeve, Put head through opening Assist Level: Touching or steadying assistance(Pt > 75%) Function - Lower Body Dressing/Undressing What is the patient wearing?: Pants Position: Bed Pants- Performed by helper: Thread/unthread right pants leg, Thread/unthread left pants leg, Pull pants up/down Socks - Performed by helper: Don/doff right sock, Don/doff left sock Shoes - Performed by helper: Don/doff right shoe, Don/doff left shoe, Fasten right, Fasten left AFO - Performed by helper: Don/doff right AFO, Don/doff left AFO TED Hose - Performed by helper: Don/doff right TED hose, Don/doff left TED hose Assist for footwear: Maximal assist Assist for lower body dressing: 2 Helpers  Function - Toileting Toileting activity did not occur: Safety/medical concerns Toileting steps completed by helper: Adjust clothing prior to toileting, Performs perineal hygiene, Adjust clothing after toileting Toileting Assistive Devices: Other (comment)(Using [employer]) Assist level: Two helpers  Function - Air cabin crew transfer activity did not occur: Safety/medical concerns Toilet transfer assistive device: Facilities manager lift: Garment/textile technologist level to toilet: 2 helpers Assist level from toilet: 2 helpers  Function - Chair/bed transfer Chair/bed transfer Adams: Lateral scoot Chair/bed transfer assist level: 2 helpers Chair/bed transfer assistive device: Sliding board Chair/bed transfer details: Tactile cues for initiation, Tactile cues for sequencing, Tactile cues for weight shifting, Tactile cues for posture, Tactile cues for placement, Verbal cues for sequencing, Verbal cues for technique, Manual facilitation for placement, Manual facilitation for weight shifting  Function - Locomotion:  Wheelchair Will patient use wheelchair at discharge?: Yes Type: Manual Max wheelchair distance: 90 ft  Assist Level: Supervision or verbal cues Assist Level: Supervision or verbal cues Assist Level: Dependent (Pt equals 0%) Turns around,maneuvers to table,bed, and toilet,negotiates 3% grade,maneuvers on rugs and over doorsills: No Function - Locomotion: Ambulation Ambulation activity did not occur: Safety/medical concerns Assistive device: Rail in hallway Max distance: 2 Assist level: 2 helpers Walk 10 feet activity did not occur: Safety/medical concerns Walk 50 feet with 2 turns activity did not occur: Safety/medical concerns Walk 150 feet activity did not occur: Safety/medical concerns Walk 10 feet on uneven surfaces activity did not occur: Safety/medical concerns  Function - Comprehension Comprehension: Auditory Comprehension assistive device: Hearing aids Comprehension assist level: Understands basic 50 - 74% of the time/ requires cueing 25 - 49% of the time, Understands basic 25 - 49% of the time/ requires cueing 50 - 75% of the time  Function - Expression Expression: Verbal Expression assist level: Expresses basic 50 - 74% of the time/requires cueing 25 - 49% of the time. Needs to repeat parts of sentences., Expresses basic 25 - 49% of the time/requires cueing 50 - 75% of  the time. Uses single words/gestures.  Function - Social Interaction Social Interaction assist level: Interacts appropriately 50 - 74% of the time - May be physically or verbally inappropriate.  Function - Problem Solving Problem solving assist level: Solves basic less than 25% of the time - needs direction nearly all the time or does not effectively solve problems and may need a restraint for safety, Solves basic 25 - 49% of the time - needs direction more than half the time to initiate, plan or complete simple activities  Function - Memory Memory assistive device: Memory book Memory assist level:  Recognizes or recalls less than 25% of the time/requires cueing greater than 75% of the time Patient normally able to recall (first 3 days only): Current season, Staff names and faces, That he or she is in a hospital  Medical Problem List and Plan: 1.  Right side weakness with dysarthria secondary to multiple bilateral infarcts (left BG, tiny Right frontal and bilateral  occipital infarcts) consistent with cardiac emboli with history of atrial fibrillation.Onset 5/29 Motor strength improving in RUE, Continue CIR PT OT speech 2.  DVT Prophylaxis/Anticoagulation: Eliquis 3. Pain Management/polyneuropathy: Tylenol as needed 4. Mood/sleep/confusion: BuSpar 10 mg twice daily,   -increased confusion with 49m restoril--- this was stopped on 6/8  -Slept a few hours last night on Seroquel 12.5 mg.  Discontinue standing dose 25 mg tonight with prn 12.5 mg dose Xanax benzo similar to temazepam which caused confusion will D/C , had increased confusion last noc 6/10  With complaints of anxiety and insomnia as well as decreased appetite would start Remeron  potential history of depression Order sleep graph 5. Neuropsych: This patient is capable of making decisions on his own behalf. 6. Skin/Wound Care: Routine skin checks 7. Fluids/Electrolytes/Nutrition: BMET normal 6/4  -Appreciate dietary consult  8.  Dysphasia.  Dysphasia #2 nectar liquids.Cortrak tube feeds for nutritional support.  Check calorie counts.    Repeat modified this week as per speech therapy    -trial Megace ongoing with sluggish results so far. With AMS above, will go ahead and hold for now as well   -H20 boluses 9.  History of atrial fibrillation.  Continue Eliquis 572mBID.  Cardiac rate controlled, on metoprolol PTA 10.  Hyperlipidemia.  Zocor on lipitor PTA 11.  History of polyneuropathy.  Patient uses bilateral AFO braces prior to admission.  No up with her deficits however 12.  Hx HTN lasix and Lisinopril PTA Vitals:   09/26/17  2238 09/27/17 0502  BP: (!) 148/92 119/62  Pulse: 86 71  Resp: 19 18  Temp: 97.8 F (36.6 C) 97.8 F (36.6 C)  SpO2: 100% 99%  BPs fair to borderline 13.  Hx of GERD-  Pepcid to 4021mID, added protonix 68m99mily, improved 14.  Nausea without vomiting, question medication versus tube feeding versus constipation.  Appears to have improved partially 15.  Constipation improving per pt 16.  Urinary retention Improving likely multifactorial immobility and prob BPH,   flomax increased to home dose 0.8mg,36marting to void incontinently, residual volumes decreasing  -Urine culture positive enterococcus on amoxicillin  Daughter who is RN is interested in discussing meds [phone] LOS (Days) 8 A FACE TO FACE EVALUATION WAS PERFORMED  AndreCharlett Blake/2019, 8:39 AM
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
## 2017-09-27 NOTE — Progress Notes (Signed)
Speech Language Pathology Weekly Progress and Session Note  Patient Details  Name: THEDORE Bryant MRN: [id number] Date of Birth: 07-22-1930  Beginning of progress report period: September 20, 2017 End of progress report period: September 27, 2017  Today's Date: 09/27/2017 SLP Individual Time: 1100-1200 SLP Individual Time Calculation (min): 60 min  Short Term Goals: Week 1: SLP Short Term Goal 1 (Week 1): Pt will consume thin liquid/ice cups trials via cup sips with min overt s/s aspiration to demonstrate readiness of instrumental swallow assessment. SLP Short Term Goal 1 - Progress (Week 1): Met SLP Short Term Goal 2 (Week 1): Pt will consume dys 3 trials with efficient mastication and oral clearnace without overt s/s aspiration to demonstrate readiness of solid upgrade. SLP Short Term Goal 2 - Progress (Week 1): Not met SLP Short Term Goal 3 (Week 1): Pt will recall new, daily information with Min A verbal cues for external aids. SLP Short Term Goal 3 - Progress (Week 1): Not met SLP Short Term Goal 4 (Week 1): Pt will demonstrate 90% intelligibility at sentence level with Mod A verbal cues for use of speech intelligibility strategies.  SLP Short Term Goal 4 - Progress (Week 1): Not met SLP Short Term Goal 5 (Week 1): Pt will demonstrate functional problem solving in semi-complex tasks with supervision A verbal cues. SLP Short Term Goal 5 - Progress (Week 1): Not met    New Short Term Goals: Week 2: SLP Short Term Goal 1 (Week 2): Pt will particpate in instrumental swallow assessment to determine least restrictive diet. SLP Short Term Goal 2 (Week 2): Pt will consume dys 3 trials with efficient mastication and oral clearnace without overt s/s aspiration to demonstrate readiness of solid upgrade. SLP Short Term Goal 3 (Week 2): Pt will recall new, daily information with Min A verbal cues for external aids. SLP Short Term Goal 4 (Week 2): Pt will demonstrate 70% intelligibility at phrase level with  Mod A verbal cues for use of speech intelligibility strategies.  SLP Short Term Goal 4 - Progress (Week 2): Revised due to lack of progress SLP Short Term Goal 5 (Week 2): Pt will demonstrate sustained attention to functional tasks for 10 minutes with Mod A verbal cues for redirection. SLP Short Term Goal 6 (Week 2): Pt will demonstrate functional problem solving in basic and familiar tasks with Mod A verbal cues. SLP Short Term Goal 6 - Progress (Week 2): Revised due to lack of progress  Weekly Progress Updates: Pt demonstrated poor progress meeting 1 out 5 goals, due to change in medical condition developing UTI, which has impacted overall cognition and speech intelligibility. Pt demonstrated steady progress on water protocol and demonstrated readiness for repeat MBS once cognition improves. Pt demonstrates return use of IMT in order to increase breath support and increase speech intelligibility. SLP downgraded goals to basi/familar problem solving and speech intelligibility as well as add sustained attention goal. SLP will adjust goals if needed as cognition improves with treatment of UTI. Pt would continue to benefit from skilled ST services to maximize functional independence and reduce burden of care requiring 24 hour supervision at discharge along with continued skilled ST service at next level of care.     Intensity: Minumum of 1-2 x/day, 30 to 90 minutes Frequency: 3 to 5 out of 7 days Duration/Length of Stay: 6/25  Treatment/Interventions: Cognitive remediation/compensation;Cueing hierarchy;Dysphagia/aspiration precaution training;Functional tasks;Patient/family education   Daily Session  Skilled Therapeutic Interventions: Skilled ST services focused on swallow and  cognitive skills. Pt continues to demonstrate reduced attention and language of confusion, requiring max A verbal cues for sustained attention in 5 minute intervals. Pt's wife stated pt did not sleep well again and adjustment  to medication is being made to decrease confusion. SLP facilitated oral care piror to trials of ice chips and thin liquid via cup, pt demonstrated min immediate throat clear over 6oz, with and without chin tuck same results. Pt demonstrated delayed belching follow liquid trials and developed mild hiccups later in session. SLP facilitated use of IMST set at level 13 cm h20, pt required mod A verbal cues. Pt continues to demonstrate 50% intelligibility at phrase level with max A verbal cues, likely impacted by attention. SLP facilitated basic problem solving utilizing sequencing 3 step ADL cards, pt required max A verbal cues. Pt was left in room with call bell within reach. Reccomend to continue skilled ST services.      Function:   Eating Eating   Modified Consistency Diet: Yes Eating Assist Level: Supervision or verbal cues           Cognition Comprehension Comprehension assist level: Understands basic 50 - 74% of the time/ requires cueing 25 - 49% of the time;Understands basic 25 - 49% of the time/ requires cueing 50 - 75% of the time  Expression   Expression assist level: Expresses basic 50 - 74% of the time/requires cueing 25 - 49% of the time. Needs to repeat parts of sentences.;Expresses basic 25 - 49% of the time/requires cueing 50 - 75% of the time. Uses single words/gestures.  Social Interaction Social Interaction assist level: Interacts appropriately 50 - 74% of the time - May be physically or verbally inappropriate.  Problem Solving Problem solving assist level: Solves basic less than 25% of the time - needs direction nearly all the time or does not effectively solve problems and may need a restraint for safety;Solves basic 25 - 49% of the time - needs direction more than half the time to initiate, plan or complete simple activities  Memory Memory assist level: Recognizes or recalls less than 25% of the time/requires cueing greater than 75% of the time;Recognizes or recalls 25 - 49% of  the time/requires cueing 50 - 75% of the time   General    Pain Pain Assessment Pain Score: 0-No pain Faces Pain Scale: Hurts a little bit  Therapy/Group: Individual Therapy  Mayan Dolney  [facility] 09/27/2017, 1:05 PM

## 2017-09-28 ENCOUNTER — Inpatient Hospital Stay (HOSPITAL_COMMUNITY): Payer: Medicare Other | Admitting: Physical Therapy

## 2017-09-28 ENCOUNTER — Inpatient Hospital Stay (HOSPITAL_COMMUNITY): Payer: Medicare Other | Admitting: Occupational Therapy

## 2017-09-28 ENCOUNTER — Inpatient Hospital Stay (HOSPITAL_COMMUNITY): Payer: Medicare Other | Admitting: Speech Pathology

## 2017-09-28 MED ORDER — METHYLPHENIDATE HCL 5 MG PO TABS
5.0000 mg | ORAL_TABLET | Freq: Two times a day (BID) | ORAL | Status: DC
  Administered 2017-09-28 – 2017-10-03 (×11): 5 mg via ORAL
  Filled 2017-09-28 (×11): qty 1

## 2017-09-28 MED ORDER — METHYLPHENIDATE HCL 5 MG PO TABS: 5.0000 mg | ORAL_TABLET | Freq: Two times a day (BID) | ORAL | Status: DC

## 2017-09-28 MED ORDER — BETHANECHOL CHLORIDE 10 MG PO TABS
10.0000 mg | ORAL_TABLET | Freq: Three times a day (TID) | ORAL | Status: DC
  Administered 2017-09-28 (×2): 10 mg via ORAL
  Filled 2017-09-28 (×2): qty 1

## 2017-09-28 NOTE — Progress Notes (Signed)
Occupational Therapy Session Note  Patient Details  Name: Walter Bryant MRN: [id number] Date of Birth: [birth year]  Today's Date: 09/28/2017 OT Individual Time: 6948-5462 OT Individual Time Calculation (min): 60 min    Short Term Goals: Week 2:  OT Short Term Goal 1 (Week 2): Pt will consistently complete sliding board transfers with max A +1 in order to reduce caregiver burden.  OT Short Term Goal 2 (Week 2): Pt will don shirt with mod A seated EOB. OT Short Term Goal 3 (Week 2): Pt will tolerate 5 minutes of therapeutic activity without need for rest break in order to increase functional activity tolerance  Skilled Therapeutic Interventions/Progress Updates:    Pt seen for OT session focusing on ADL re-training, functional sitting balance, and transfer. Pt in supine upon arrival with wife feeding pt. Cont to educate regarding importance of pt independence and participation with ADLs including his ability to self-feed. Pants donned total A at bed level, pt demonstrates ipaired ability to lift stronger L LE in order to thread into pants. He rolled with mod A to R, +2 to L for pants to be pulled up and total A +1 to come sitting EOB using hospital bed functions.  Pt required mod A overall with max cuing for anterior weightshift for static sitting balance EOB while TED hose and B AFOs/shoes donned total A. Pt completed +2 total A sliding board transfer to w/c, transferring to stronger L side though pt unable to assist with transfer 2/2 delayed processing and poor sitting balance and anterior weightshift needed for transfer.  He completed grooming tasks from w/c level at sink, assist for set-up. Pt required to anteriorly weightshift in order reach sink controls and obtain items. He was able to shave and complete oral care.  Throughout session, pt required significantly increased time and rest breaks throughout secondary to anxiety and poor activity tolerance/ endurance.  Pt left seated in w/c at  end of session, wife present and all needs in reach.   Therapy Documentation Precautions:  Precautions Precautions: Fall, Other (comment) Precaution Comments: R hemi, lateral lean and pushes to the R; Cortrak Required Braces or Orthoses: Other Brace/Splint Other Brace/Splint: bilateral upright AFO's (used PTA. 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   Therapy/Group: Individual Therapy  Espyn Radwan L 09/28/2017, 7:03 AM

## 2017-09-28 NOTE — Progress Notes (Signed)
SLP Cancellation Note  Patient Details Name: Walter Bryant MRN: [id number] DOB: 28-May-1930   Cancelled treatment:        Pt was received in bed, awake but very lethargic.  Pt feeling poorly due to UTI and lack of sleep over the last several days.  Speech was barely intelligible due to lethargy and increased work of breathing.  Due to fatigue, pt missed 30 minutes of skilled ST.  Will continue efforts at next available appointment.                                                                                                  Eduar Kumpf, Selinda Orion 09/28/2017, 3:45 PM
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
## 2017-09-28 NOTE — Progress Notes (Addendum)
Physical Therapy Session Note  Patient Details  Name: Walter Bryant MRN: [id number] Date of Birth: May 31, 1930  Today's Date: 09/28/2017 PT Individual Time: 8916-9450 and 3888-2800 PT Individual Time Calculation (min): 57 min and 12 min  Short Term Goals: Week 2:  PT Short Term Goal 1 (Week 2): Pt will ambulate 10 ft with max assist with therapy only, for strengthening & NMR. PT Short Term Goal 2 (Week 2): Pt will tolerate standing x 10 minutes for weight bearing and NMR. PT Short Term Goal 3 (Week 2): Pt will propel w/c 125 ft with supervision.    Skilled Therapeutic Interventions/Progress Updates:  Treatment 1: Pt received in w/c reporting need to urinate. Therapist provided total assist for managing shorts, brief, and urinal placement but pt unable to void even after extra time given. Transported pt to gym via w/c total assist & donned maxisky sling total assist. Attempted standing with use of maxisky & +2 assist, but pt requires total assist for anterior weight shifting and hand placement on parallel bar. Pt unable to attend to task and follow commands to activate glutes/hamstrings to attempt to come to standing and continues to demonstrate significant R lateral lean. Pt required +2 assist for scooting back in chair as pt unable to assist with anterior weight shifting even with significant multimodal cuing & facilitation. Transitioned to peg board task with pt correctly assembling simple shape from pre selected pieces with cuing to begin task, pt then able to assemble 2nd simple shape from choice of many with 1 error with inability to independently correct. Pt required max assistance to recognize R lateral lean while sitting in TIS w/c 2/2 pt's impaired midline orientation. Discussed pt's progress, with therapist educating him on overall decline since last week, with pt reporting "I've had two very tiring days". Discussed ways to improve pt's tolerance for therapy, with pt reporting having a hard  time concentrating. At end of session pt left sitting in TIS w/c with call bell in lap & RN made aware of pt's need to void.   Treatment 2: Pt received in bed with wife Romie Minus) present in room. Pt appearing very fatigued, speaking with nonclear speech, requiring cuing to try to speak more clear. Pt also very confused, reporting "Just let me die. I need to go, I have an appointment." with therapist providing encouragement & education regarding situation. Therapist educated Romie Minus on change of schedule to 15/7 to allow pt hopefully to tolerate therapy more. Also discussed pt's current status and encouraged him to rest. Pt left in bed with alarm set & wife present to supervise. Will f/u per POC.  Therapy Documentation Precautions:  Precautions Precautions: Fall, Other (comment) Precaution Comments: R hemi, lateral lean and pushes to the R; Cortrak Required Braces or Orthoses: Other Brace/Splint Other Brace/Splint: bilateral upright AFO's (used PTA. Has h/o charcot foot on L) Restrictions Weight Bearing Restrictions: No  General: PT Amount of Missed Time (min): 33 Minutes PT Missed Treatment Reason: Patient fatigue   See Function Navigator for Current Functional Status.   Therapy/Group: Individual Therapy  [name] 09/28/2017, 2:36 PM

## 2017-09-28 NOTE — Progress Notes (Signed)
Physical Therapy Note  Patient Details  Name: Walter Bryant MRN: [id number] Date of Birth: 1931/03/20 Today's Date: 09/28/2017    Pt's plan of care adjusted to 15/7 after speaking with care team and discussed with MD in team conference as pt currently unable to tolerate current therapy schedule with OT, PT, and SLP.     [name] 09/28/2017, 10:44 AM

## 2017-09-28 NOTE — Progress Notes (Signed)
Subjective/Complaints:  Remains tired, slept a little better last noc per pt. patient denies any pains today no dysuria.  No sweats or chills.  No breathing issues.  ROS: Limited due to cognitive/behavioral   Objective: Vital Signs: Blood pressure 120/72, pulse 75, temperature 99 F (37.2 C), temperature source Oral, resp. rate 18, height 6' 2"  (1.88 m), weight 98.2 kg (216 lb 7.9 oz), SpO2 98 %. No results found. Results for orders placed or performed during the hospital encounter of 09/19/17 (from the past 72 hour(s))  Basic metabolic panel     Status: Abnormal   Collection Time: 09/26/17  5:37 AM  Result Value Ref Range   Sodium 139 135 - 145 mmol/L   Potassium 4.3 3.5 - 5.1 mmol/L   Chloride 104 101 - 111 mmol/L   CO2 26 22 - 32 mmol/L   Glucose, Bld 152 (H) 65 - 99 mg/dL   BUN 21 (H) 6 - 20 mg/dL   Creatinine, Ser 0.90 0.61 - 1.24 mg/dL   Calcium 9.7 8.9 - 10.3 mg/dL   GFR calc non Af Amer >60 >60 mL/min   GFR calc Af Amer >60 >60 mL/min    Comment: (NOTE) The eGFR has been calculated using the CKD EPI equation. This calculation has not been validated in all clinical situations. eGFR's persistently <60 mL/min signify possible Chronic Kidney Disease.    Anion gap 9 5 - 15    Comment: Performed at [facility] [address] [place] [address]  CBC     Status: None   Collection Time: 09/26/17  5:37 AM  Result Value Ref Range   WBC 10.4 4.0 - 10.5 K/uL   RBC 5.06 4.22 - 5.81 MIL/uL   Hemoglobin 16.1 13.0 - 17.0 g/dL   HCT 48.4 39.0 - 52.0 %   MCV 95.7 78.0 - 100.0 fL   MCH 31.8 26.0 - 34.0 pg   MCHC 33.3 30.0 - 36.0 g/dL   RDW 14.5 11.5 - 15.5 %   Platelets 242 150 - 400 K/uL    Comment: Performed at [facility], [place] [address] [place], [place] [address]     Constitutional: No distress . Vital signs reviewed. HEENT: EOMI, oral membranes moist Neck: supple Cardiovascular: RRR without murmur. No JVD    Respiratory: CTA Bilaterally without  wheezes or rales. Normal effort    GI: BS +, non-tender, non-distended  Skin:   Other IV sites and nares CDI Neuro:  Alert, follows simple commands.  Decreased insight and awareness., Abnormal Sensory Reduced bilateral below ankles, Abnormal Motor 3-/5 RUE grip, 2- RUE Bicepts and Triceps and 2- RLE hip/knee extensor synergy as well as plantarflexion.  Moderately dysarthric Musc/Skel:  Other mild pain with Right shoulder ext rotation, neg impingement sign Psych: Remains restless/anxious  Assessment/Plan: 1. 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 Physiatrist is providing close team supervision and 24 hour management of active medical problems listed below. Physiatrist and rehab team continue to assess barriers to discharge/monitor patient progress toward functional and medical goals. FIM: Function - Bathing Position: Bed Body parts bathed by helper: Right arm, Left arm, Chest, Abdomen, Front perineal area, Buttocks, Right upper leg, Left upper leg, Right lower leg, Left lower leg, Back Assist Level: 2 helpers  Function- Upper Body Dressing/Undressing What is the patient wearing?: Pull over shirt/dress Pull over shirt/dress - Perfomed by patient: Pull  shirt over trunk, Put head through opening Pull over shirt/dress - Perfomed by helper: Thread/unthread left sleeve, Thread/unthread right sleeve, Put head through opening Assist Level: Touching or steadying assistance(Pt > 75%) Function - Lower Body Dressing/Undressing What is the patient wearing?: Pants Position: Bed Pants- Performed by helper: Thread/unthread right pants leg, Thread/unthread left pants leg, Pull pants up/down Socks - Performed by helper: Don/doff right sock, Don/doff left sock Shoes - Performed by helper: Don/doff right shoe, Don/doff left shoe, Fasten right, Fasten left AFO - Performed  by helper: Don/doff right AFO, Don/doff left AFO TED Hose - Performed by helper: Don/doff right TED hose, Don/doff left TED hose Assist for footwear: Maximal assist Assist for lower body dressing: 2 Helpers  Function - Toileting Toileting activity did not occur: Safety/medical concerns Toileting steps completed by helper: Adjust clothing prior to toileting, Performs perineal hygiene, Adjust clothing after toileting Toileting Assistive Devices: Other (comment)(Using [employer]) Assist level: Two helpers  Function - Air cabin crew transfer activity did not occur: Safety/medical concerns Toilet transfer assistive device: Facilities manager lift: Garment/textile technologist level to toilet: 2 helpers Assist level from toilet: 2 helpers  Function - Chair/bed transfer Chair/bed transfer [place]: Lateral scoot Chair/bed transfer assist level: 2 helpers Chair/bed transfer assistive device: Sliding board Chair/bed transfer details: Tactile cues for initiation, Tactile cues for sequencing, Tactile cues for weight shifting, Tactile cues for posture, Tactile cues for placement, Verbal cues for sequencing, Verbal cues for technique, Manual facilitation for placement, Manual facilitation for weight shifting  Function - Locomotion: Wheelchair Will patient use wheelchair at discharge?: Yes Type: Manual Max wheelchair distance: 90 ft  Assist Level: Supervision or verbal cues Assist Level: Supervision or verbal cues Assist Level: Dependent (Pt equals 0%) Turns around,maneuvers to table,bed, and toilet,negotiates 3% grade,maneuvers on rugs and over doorsills: No Function - Locomotion: Ambulation Ambulation activity did not occur: Safety/medical concerns Assistive device: Rail in hallway Max distance: 2 Assist level: 2 helpers Walk 10 feet activity did not occur: Safety/medical concerns Walk 50 feet with 2 turns activity did not occur: Safety/medical concerns Walk 150 feet activity did not occur:  Safety/medical concerns Walk 10 feet on uneven surfaces activity did not occur: Safety/medical concerns  Function - Comprehension Comprehension: Auditory Comprehension assistive device: Hearing aids Comprehension assist level: Understands basic 50 - 74% of the time/ requires cueing 25 - 49% of the time, Understands basic 25 - 49% of the time/ requires cueing 50 - 75% of the time  Function - Expression Expression: Verbal Expression assist level: Expresses basic 50 - 74% of the time/requires cueing 25 - 49% of the time. Needs to repeat parts of sentences., Expresses basic 25 - 49% of the time/requires cueing 50 - 75% of the time. Uses single words/gestures.  Function - Social Interaction Social Interaction assist level: Interacts appropriately 50 - 74% of the time - May be physically or verbally inappropriate.  Function - Problem Solving Problem solving assist level: Solves basic less than 25% of the time - needs direction nearly all the time or does not effectively solve problems and may need a restraint for safety, Solves basic 25 - 49% of the time - needs direction more than half the time to initiate, plan or complete simple activities  Function - Memory Memory assistive device: Memory book Memory assist level: Recognizes or recalls less than 25% of the time/requires cueing greater than 75% of the time, Recognizes or recalls 25 - 49% of the time/requires cueing 50 - 75% of the time  Patient normally able to recall (first 3 days only): Current season, Staff names and faces, That he or she is in a hospital  Medical Problem List and Plan: 1.  Right side weakness with dysarthria secondary to multiple bilateral infarcts (left BG, tiny Right frontal and bilateral  occipital infarcts) consistent with cardiac emboli with history of atrial fibrillation.Onset 5/29 Motor strength improving in RUE, Continue CIR PT OT speech, Team conference today please see physician documentation under team conference  tab, met with team face-to-face to discuss problems,progress, and goals. Formulized individual treatment plan based on medical history, underlying problem and comorbidities.  2.  DVT Prophylaxis/Anticoagulation: Eliquis 3. Pain Management/polyneuropathy: Tylenol as needed 4. Mood/sleep/confusion: BuSpar 10 mg twice daily,   -increased confusion with 30m restoril--- this was stopped on 6/8  -Slept a few hours last night on Seroquel 12.5 mg.  Discontinue standing dose 25 mg tonight with prn 12.5 mg dose Xanax benzo similar to temazepam which caused confusion will D/C , had increased confusion last noc 6/10  With complaints of anxiety and insomnia as well as decreased appetite would start Remeron  potential history of depression Order sleep graph 5. Neuropsych: This patient is capable of making decisions on his own behalf. 6. Skin/Wound Care: Routine skin checks 7. Fluids/Electrolytes/Nutrition: BMET normal 6/4  -Appreciate dietary consult  8.  Dysphasia.  Dysphasia #2 nectar liquids.Cortrak tube feeds for nutritional support.  Check calorie counts.    Repeat modified this week as per speech therapy    -trial Megace ongoing with sluggish results so far. With AMS above, will go ahead and hold for now as well   -H20 boluses 9.  History of atrial fibrillation.  Continue Eliquis 536mBID.  Cardiac rate controlled, on metoprolol PTA 10.  Hyperlipidemia.  Zocor on lipitor PTA 11.  History of polyneuropathy.  Patient uses bilateral AFO braces prior to admission.  No up with her deficits however 12.  Hx HTN lasix and Lisinopril PTA Vitals:   09/27/17 1924 09/28/17 0418  BP: (!) 147/72 120/72  Pulse: 80 75  Resp: 18 18  Temp: 98 F (36.7 C) 99 F (37.2 C)  SpO2: 92% 98%  BPs fair to borderline 13.  Hx of GERD-  Pepcid to 4062mID, added protonix 71m28mily, improved 14.  Nausea without vomiting, question medication versus tube feeding versus constipation.  Appears to have improved partially 15.   Constipation improving per pt 16.  Urinary retention Improving likely multifactorial immobility and prob BPH,   flomax increased to home dose 0.8mg,2marting to void incontinently, residual volumes decreasing  -Urine culture positive enterococcus on amoxicillin-hopefully treatment of this may help with some confusion. 17.  Insomnia, sleep wake cycle disruption, continue Remeron, add daytime dose of Ritalin Daughter who is RN is interested in discussing meds [phone] spoke today.  Answered questions LOS (Days) 9 A FACE TO FACE EVALUATION WAS PERFORMED  AndreCharlett Blake/2019, 8:50 AM

## 2017-09-29 ENCOUNTER — Inpatient Hospital Stay (HOSPITAL_COMMUNITY): Payer: Medicare Other | Admitting: Occupational Therapy

## 2017-09-29 ENCOUNTER — Inpatient Hospital Stay (HOSPITAL_COMMUNITY): Payer: Medicare Other | Admitting: Speech Pathology

## 2017-09-29 ENCOUNTER — Inpatient Hospital Stay (HOSPITAL_COMMUNITY): Payer: Medicare Other | Admitting: Physical Therapy

## 2017-09-29 MED ORDER — BETHANECHOL CHLORIDE 10 MG PO TABS
10.0000 mg | ORAL_TABLET | Freq: Four times a day (QID) | ORAL | Status: DC
Start: 2017-09-29 — End: 2017-10-03
  Administered 2017-09-29 – 2017-10-03 (×19): 10 mg via ORAL
  Filled 2017-09-29 (×19): qty 1

## 2017-09-29 NOTE — Progress Notes (Signed)
Physical Therapy Session Note  Patient Details  Name: Walter Bryant MRN: [id number] Date of Birth: Apr 15, 1931  Today's Date: 09/29/2017 PT Individual Time: 1100-1155 PT Individual Time Calculation (min): 55 min   Short Term Goals: Week 2:  PT Short Term Goal 1 (Week 2): Pt will ambulate 10 ft with max assist with therapy only, for strengthening & NMR. PT Short Term Goal 2 (Week 2): Pt will tolerate standing x 10 minutes for weight bearing and NMR. PT Short Term Goal 3 (Week 2): Pt will propel w/c 125 ft with supervision.  Skilled Therapeutic Interventions/Progress Updates:    Pt received seated in TIS w/c in room, agreeable to PT. No complaints of pain. Pt reports feeling fatigued but does report feeling more energized than yesterday. Attempt sit to stand in // bars with MaxiSky, pt unable to initiate anterior weight shift and engage hip extensors to come to a full stand even with assist from lift. Sit to stand in standing frame x 2 reps, pt tolerates standing x 5 min and then x 30 sec as pt is fatigued from first bout of standing. Mirror for visual feedback for midline and upright posture. Manual and verbal cues to hip and trunk extensors for upright posture. Sliding board transfer w/c to bed with max A x 2. Sit to supine total A x 2. Pt left semi-reclined in bed with needs in reach, bed alarm in place, wife present.  Therapy Documentation Precautions:  Precautions Precautions: Fall, Other (comment) Precaution Comments: R hemi, lateral lean and pushes to the R; Cortrak Required Braces or Orthoses: Other Brace/Splint Other Brace/Splint: bilateral upright AFO's (used PTA. Has h/o charcot foot on L) Restrictions Weight Bearing Restrictions: No  See Function Navigator for Current Functional Status.   Therapy/Group: Individual Therapy  Excell Seltzer, PT, DPT  09/29/2017, 4:46 PM

## 2017-09-29 NOTE — Plan of Care (Signed)
  Problem: Consults Goal: RH STROKE PATIENT EDUCATION Description See Patient Education module for education specifics  Outcome: Progressing Goal: Nutrition Consult-if indicated Outcome: Progressing   Problem: RH BOWEL ELIMINATION Goal: RH STG MANAGE BOWEL WITH ASSISTANCE Description STG Manage Bowel with moderate Assistance.  Outcome: Progressing Goal: RH STG MANAGE BOWEL W/MEDICATION W/ASSISTANCE Description STG Manage Bowel with Medication with min Assistance.  Outcome: Progressing   Problem: RH BLADDER ELIMINATION Goal: RH STG MANAGE BLADDER WITH ASSISTANCE Description STG Manage Bladder With moderate Assistance  Outcome: Progressing Goal: RH STG MANAGE BLADDER WITH EQUIPMENT WITH ASSISTANCE Description STG Manage Bladder With Equipment and With total Assistance  Outcome: Progressing   Problem: RH SKIN INTEGRITY Goal: RH STG SKIN FREE OF INFECTION/BREAKDOWN Description Skin to remain free from breakdown with min assistance while controlling incontinence by toileting patient every 4 hours or more frequently as needed by patient.  Outcome: Progressing Goal: RH STG MAINTAIN SKIN INTEGRITY WITH ASSISTANCE Description STG Maintain Skin Integrity With moderate Assistance.  Outcome: Progressing Goal: RH STG ABLE TO PERFORM INCISION/WOUND CARE W/ASSISTANCE Description STG Able To Perform skin Care With moderate Assistance.  Outcome: Progressing   Problem: RH SAFETY Goal: RH STG ADHERE TO SAFETY PRECAUTIONS W/ASSISTANCE/DEVICE Description STG Adhere to Safety Precautions With moderate Assistance and appropriate assistive Devices.  Outcome: Progressing Goal: RH STG DECREASED RISK OF FALL WITH ASSISTANCE Description STG Decreased Risk of Fall With moderate Assistance while following CIR's new standard toileting protocol of every 4 hours.  Outcome: Progressing   Problem: RH PAIN MANAGEMENT Goal: RH STG PAIN MANAGED AT OR BELOW PT'S PAIN GOAL Description < 3 on a 0-10  pain scale  Outcome: Progressing   Problem: RH KNOWLEDGE DEFICIT Goal: RH STG INCREASE KNOWLEDGE OF HYPERTENSION Description Patient and spouse will be able to demonstrate knowledge of new HTN medications, including dosage amounts, dosages per day, and follow-up care with the MD post discharge with min assist from rehab staff.  Outcome: Progressing Goal: RH STG INCREASE KNOWLEDGE OF DYSPHAGIA/FLUID INTAKE Description Patient and spouse will be able to demonstrate knowledge of safe swallowing techniques, and proper techniques for thickening liquids with min assist from rehab staff.  Outcome: Progressing   

## 2017-09-29 NOTE — Progress Notes (Signed)
Occupational Therapy Session Note  Patient Details  Name: Walter Bryant MRN: [id number] Date of Birth: 01-31-1931  Today's Date: 09/29/2017 OT Individual Time: 0845-1000 OT Individual Time Calculation (min): 75 min    Short Term Goals: Week 2:  OT Short Term Goal 1 (Week 2): Pt will consistently complete sliding board transfers with max A +1 in order to reduce caregiver burden.  OT Short Term Goal 2 (Week 2): Pt will don shirt with mod A seated EOB. OT Short Term Goal 3 (Week 2): Pt will tolerate 5 minutes of therapeutic activity without need for rest break in order to increase functional activity tolerance  Skilled Therapeutic Interventions/Progress Updates:    Pt seen for OT session focusing on ADL re-training, functional transfers, and functional sitting balance. Pt in supine upon arrival, agreeable to tx session, pt's daughter present.  Pt with increased slurred speech this morning, cuing required throughout for pt to speak loudly and annunciate, he was however, more alert this session, keeping eyes open throughout duration of tx session. LB dressing completed total A at bed level, pt rolling with total A to R. He transferred to sitting EOB with total A +1. Pt sat EOB while B AFOs/shoes donned total A, required max cuing for anterior weight shift to maintain sitting balance with CGA up to mod A required for static sitting balance. +2 total A sliding board transfer completed into tilt-in-space, pt with difficulty obtaining and maintaining anterior weight shift required for sliding board transfer. All transfers completed in this method throughout session. In therapy gym, addressed dynamic sitting balance and anterior weight shift sitting EOM. Mirror placed in front of pt for visual feedback which pt responded well to. Completed functional reaching task with pt required to reach forward to obtain cup and place on target on R emphasizing anterior weightshift and weightbearing through R LE/UE  during reaching task.  Pt returned to room at end of session, left tilted back in w/c with all needs in reach and daughter present.    Therapy Documentation Precautions:  Precautions Precautions: Fall, Other (comment) Precaution Comments: R hemi, lateral lean and pushes to the R; Cortrak Required Braces or Orthoses: Other Brace/Splint Other Brace/Splint: bilateral upright AFO's (used PTA. Has h/o charcot foot on L) Restrictions Weight Bearing Restrictions: No Pain: Pain Assessment Pain Scale: 0-10 Pain Score: 0-No pain ADL: ADL ADL Comments: see functional navigator  See Function Navigator for Current Functional Status.   Therapy/Group: Individual Therapy  Duchess Armendarez L 09/29/2017, 6:57 AM

## 2017-09-29 NOTE — Progress Notes (Signed)
Speech Language Pathology Daily Session Note  Patient Details  Name: Walter Bryant MRN: [id number] Date of Birth: Jun 01, 1930  Today's Date: 09/29/2017 SLP Individual Time: 2778-2423 SLP Individual Time Calculation (min): 30 min  Short Term Goals: Week 2: SLP Short Term Goal 1 (Week 2): Pt will particpate in instrumental swallow assessment to determine least restrictive diet. SLP Short Term Goal 2 (Week 2): Pt will consume dys 3 trials with efficient mastication and oral clearnace without overt s/s aspiration to demonstrate readiness of solid upgrade. SLP Short Term Goal 3 (Week 2): Pt will recall new, daily information with Min A verbal cues for external aids. SLP Short Term Goal 4 (Week 2): Pt will demonstrate 70% intelligibility at phrase level with Mod A verbal cues for use of speech intelligibility strategies.  SLP Short Term Goal 4 - Progress (Week 2): Revised due to lack of progress SLP Short Term Goal 5 (Week 2): Pt will demonstrate sustained attention to functional tasks for 10 minutes with Mod A verbal cues for redirection. SLP Short Term Goal 6 (Week 2): Pt will demonstrate functional problem solving in basic and familiar tasks with Mod A verbal cues. SLP Short Term Goal 6 - Progress (Week 2): Revised due to lack of progress  Skilled Therapeutic Interventions:  Pt was seen for skilled ST targeting goals for dysphagia and communication.  Pt's daughter and RN were present at the time of today's session and report x2 episodes where pt appeared to have aspirated.  Daughter reported first episode occurred due to pt hiccupping while drinking a sip of nectar thick liquids.  RN reported that second episode happened shortly after med administration and it was unclear whether coughing was related to pill in puree or nectar thick liquids.  Pt had strong cough at baseline, slightly congested but not productive.  No immediate coughing was evident with cup sips of nectar thick liquids but pt did  have delayed coughing throughout PO intake.   Difficult to determine whether coughing was directly related to POs versus congestion.  Voicing appeared clear immediately following intake but pt needed mod assist cues to slow rate and increase vocal intensity to achieve intelligibility at the sentence level.  Pt was left in bed with bed alarm set and call bell within reach.    Of note, therapist returned later that afternoon to check on patient.  Pt was resting soundly but wife was at bedside and reported that pt had no coughing with his lunch tray.  Will continue to follow pt closely for diet toleration.    Function:  Eating Eating   Modified Consistency Diet: Yes Eating Assist Level: Supervision or verbal cues           Cognition Comprehension Comprehension assist level: Understands basic 75 - 89% of the time/ requires cueing 10 - 24% of the time  Expression   Expression assist level: Expresses basic 50 - 74% of the time/requires cueing 25 - 49% of the time. Needs to repeat parts of sentences.  Social Interaction Social Interaction assist level: Interacts appropriately 50 - 74% of the time - May be physically or verbally inappropriate.  Problem Solving Problem solving assist level: Solves basic 25 - 49% of the time - needs direction more than half the time to initiate, plan or complete simple activities  Memory Memory assist level: Recognizes or recalls 25 - 49% of the time/requires cueing 50 - 75% of the time    Pain Pain Assessment Pain Scale: 0-10 Pain Score: 0-No  pain  Therapy/Group: Individual Therapy  Ollie Esty, Selinda Orion 09/29/2017, 11:51 AM

## 2017-09-29 NOTE — Progress Notes (Signed)
Subjective/Complaints:  Incomplete sleep chart but RN notes improved sleep ;last noc Hiccups this am with a coughing episode that has resolved  ROS: Limited due to cognitive/behavioral , no pains no breathing problems , no nausea or vomiting, poor appetite this am  Objective: Vital Signs: Blood pressure 139/67, pulse 81, temperature 98 F (36.7 C), temperature source Oral, resp. rate 18, height 6\' 2"  (1.88 m), weight 98.2 kg (216 lb 7.9 oz), SpO2 99 %. No results found. No results found for this or any previous visit (from the past 72 hour(s)).   Constitutional: No distress . Vital signs reviewed. HEENT: EOMI, oral membranes moist Neck: supple Cardiovascular: RRR without murmur. No JVD    Respiratory: CTA Bilaterally without wheezes or rales. Normal effort    GI: BS +, non-tender, non-distended  Skin:   Other IV sites and nares CDI Neuro:  Alert, follows simple commands.  Decreased insight and awareness., Abnormal Sensory Reduced bilateral below ankles, Abnormal Motor 3-/5 RUE grip, 2- RUE Bicepts and Triceps and 2- RLE hip/knee extensor synergy as well as plantarflexion.  Moderately dysarthric Musc/Skel:  Other mild pain with Right shoulder ext rotation, neg impingement sign Psych: Remains restless/anxious  Assessment/Plan: 1. 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 Physiatrist is providing close team supervision and 24 hour management of active medical problems listed below. Physiatrist and rehab team continue to assess barriers to discharge/monitor patient progress toward functional and medical goals. FIM: Function - Bathing Position: Bed Body parts bathed by helper: Right arm, Left arm, Chest, Abdomen, Front perineal area, Buttocks, Right upper leg, Left upper leg, Right lower leg, Left lower leg, Back Assist Level: 2 helpers  Function- Upper  Body Dressing/Undressing What is the patient wearing?: Pull over shirt/dress Pull over shirt/dress - Perfomed by patient: Pull shirt over trunk, Put head through opening Pull over shirt/dress - Perfomed by helper: Thread/unthread left sleeve, Thread/unthread right sleeve, Put head through opening Assist Level: Touching or steadying assistance(Pt > 75%) Function - Lower Body Dressing/Undressing What is the patient wearing?: Pants Position: Bed Pants- Performed by helper: Thread/unthread right pants leg, Thread/unthread left pants leg, Pull pants up/down Socks - Performed by helper: Don/doff right sock, Don/doff left sock Shoes - Performed by helper: Don/doff right shoe, Don/doff left shoe, Fasten right, Fasten left AFO - Performed by helper: Don/doff right AFO, Don/doff left AFO TED Hose - Performed by helper: Don/doff right TED hose, Don/doff left TED hose Assist for footwear: Maximal assist Assist for lower body dressing: 2 Helpers  Function - Toileting Toileting activity did not occur: Safety/medical concerns Toileting steps completed by helper: Adjust clothing prior to toileting, Performs perineal hygiene, Adjust clothing after toileting Toileting Assistive Devices: Other (comment)(Using [employer]) Assist level: Two helpers  Function - Air cabin crew transfer activity did not occur: Safety/medical concerns Toilet transfer assistive device: Facilities manager lift: Garment/textile technologist level to toilet: 2 helpers Assist level from toilet: 2 helpers  Function - Chair/bed transfer Chair/bed transfer [place]: Lateral scoot Chair/bed transfer assist level: 2 helpers Chair/bed transfer assistive device: Sliding board Chair/bed transfer details: Tactile cues for initiation, Tactile cues for sequencing, Tactile cues for weight shifting, Tactile cues for posture, Tactile cues for placement, Verbal cues for sequencing, Verbal cues for technique, Manual facilitation for placement,  Manual facilitation for weight shifting  Function - Locomotion: Wheelchair Will patient use wheelchair at discharge?: Yes Type: Manual Max wheelchair distance:  90 ft  Assist Level: Supervision or verbal cues Assist Level: Supervision or verbal cues Assist Level: Dependent (Pt equals 0%) Turns around,maneuvers to table,bed, and toilet,negotiates 3% grade,maneuvers on rugs and over doorsills: No Function - Locomotion: Ambulation Ambulation activity did not occur: Safety/medical concerns Assistive device: Rail in hallway Max distance: 2 Assist level: 2 helpers Walk 10 feet activity did not occur: Safety/medical concerns Walk 50 feet with 2 turns activity did not occur: Safety/medical concerns Walk 150 feet activity did not occur: Safety/medical concerns Walk 10 feet on uneven surfaces activity did not occur: Safety/medical concerns  Function - Comprehension Comprehension: Auditory Comprehension assistive device: Hearing aids Comprehension assist level: Understands basic 50 - 74% of the time/ requires cueing 25 - 49% of the time, Understands basic 25 - 49% of the time/ requires cueing 50 - 75% of the time  Function - Expression Expression: Verbal Expression assist level: Expresses basic 50 - 74% of the time/requires cueing 25 - 49% of the time. Needs to repeat parts of sentences., Expresses basic 25 - 49% of the time/requires cueing 50 - 75% of the time. Uses single words/gestures.  Function - Social Interaction Social Interaction assist level: Interacts appropriately 50 - 74% of the time - May be physically or verbally inappropriate.  Function - Problem Solving Problem solving assist level: Solves basic less than 25% of the time - needs direction nearly all the time or does not effectively solve problems and may need a restraint for safety, Solves basic 25 - 49% of the time - needs direction more than half the time to initiate, plan or complete simple activities  Function -  Memory Memory assistive device: Memory book Memory assist level: Recognizes or recalls less than 25% of the time/requires cueing greater than 75% of the time, Recognizes or recalls 25 - 49% of the time/requires cueing 50 - 75% of the time Patient normally able to recall (first 3 days only): Current season, Staff names and faces, That he or she is in a hospital  Medical Problem List and Plan: 1.  Right side weakness with dysarthria secondary to multiple bilateral infarcts (left BG, tiny Right frontal and bilateral  occipital infarcts) consistent with cardiac emboli with history of atrial fibrillation.Onset 5/29 Motor strength improving in RUE,discussed progress with family  Continue CIR PT OT speech, 2.  DVT Prophylaxis/Anticoagulation: Eliquis 3. Pain Management/polyneuropathy: Tylenol as needed 4. Mood/sleep/confusion: BuSpar 10 mg twice daily,   -increased confusion with 15mg  restoril--- this was stopped on 6/8  -Slept a few hours last night on Seroquel 12.5 mg.  Discontinue standing dose 25 mg tonight with prn 12.5 mg dose Xanax benzo similar to temazepam which caused confusion will D/C , had increased confusion last noc 6/10  With complaints of anxiety and insomnia as well as decreased appetite would start Remeron  potential history of depression Incomplete sleep graph 5. Neuropsych: This patient is capable of making decisions on his own behalf. 6. Skin/Wound Care: Routine skin checks 7. Fluids/Electrolytes/Nutrition: BMET normal 6/4  -Appreciate dietary consult  8.  Dysphasia.  Dysphasia #2 nectar liquids.Cortrak tube feeds for nutritional support.  Check calorie counts.    Repeat modified this week as per speech therapy    -trial Megace ongoing with sluggish results so far. With AMS above, will go ahead and hold for now as well   -H20 boluses 9.  History of atrial fibrillation.  Continue Eliquis 5mg  BID.  Cardiac rate controlled, on metoprolol PTA 10.  Hyperlipidemia.  Zocor on lipitor  PTA 11.  History of polyneuropathy.  Patient uses bilateral AFO braces prior to admission.  No up with her deficits however 12.  Hx HTN lasix and Lisinopril PTA Vitals:   09/28/17 2021 09/29/17 0413  BP: 138/71 139/67  Pulse: 75 81  Resp: 16 18  Temp: 98 F (36.7 C) 98 F (36.7 C)  SpO2: 98% 99%  BPs controlled 6/13 13.  Hx of GERD-  Pepcid to 40mg  BID, added protonix 40mg  daily, improved 14.  Nausea without vomiting, question medication versus tube feeding versus constipation.  Appears to have improved partially 15.  Constipation improving per pt 16.  Urinary retention  likely multifactorial immobility and prob BPH,   flomax increased to home dose 0.8mg , added urecholine  -Urine culture positive enterococcus on amoxicillin-hopefully treatment of this may help with some confusion. 17.  Insomnia, sleep wake cycle disruption, continue Remeron, add daytime dose of Ritalin breakfast and lunch Daughter who is RN is interested in discussing meds [phone]-we spoke today.  Answered questions LOS (Days) 10 A FACE TO FACE EVALUATION WAS PERFORMED  Charlett Blake 09/29/2017, 8:47 AM

## 2017-09-30 ENCOUNTER — Inpatient Hospital Stay (HOSPITAL_COMMUNITY): Payer: Medicare Other | Admitting: Occupational Therapy

## 2017-09-30 ENCOUNTER — Inpatient Hospital Stay (HOSPITAL_COMMUNITY): Payer: Medicare Other | Admitting: Speech Pathology

## 2017-09-30 ENCOUNTER — Inpatient Hospital Stay (HOSPITAL_COMMUNITY): Payer: Medicare Other | Admitting: Physical Therapy

## 2017-09-30 MED ORDER — BACLOFEN 5 MG HALF TABLET
5.0000 mg | ORAL_TABLET | Freq: Two times a day (BID) | ORAL | Status: DC
Start: 2017-09-30 — End: 2017-10-01
  Administered 2017-09-30 (×2): 5 mg via ORAL
  Filled 2017-09-30 (×3): qty 1

## 2017-09-30 NOTE — Plan of Care (Signed)
  Problem: Consults Goal: RH STROKE PATIENT EDUCATION Description See Patient Education module for education specifics  Outcome: Progressing Goal: Nutrition Consult-if indicated Outcome: Progressing   Problem: RH BOWEL ELIMINATION Goal: RH STG MANAGE BOWEL WITH ASSISTANCE Description STG Manage Bowel with moderate Assistance.  Outcome: Progressing Goal: RH STG MANAGE BOWEL W/MEDICATION W/ASSISTANCE Description STG Manage Bowel with Medication with min Assistance.  Outcome: Progressing   Problem: RH BLADDER ELIMINATION Goal: RH STG MANAGE BLADDER WITH ASSISTANCE Description STG Manage Bladder With moderate Assistance  Outcome: Progressing Goal: RH STG MANAGE BLADDER WITH EQUIPMENT WITH ASSISTANCE Description STG Manage Bladder With Equipment and With total Assistance  Outcome: Progressing   Problem: RH SKIN INTEGRITY Goal: RH STG SKIN FREE OF INFECTION/BREAKDOWN Description Skin to remain free from breakdown with min assistance while controlling incontinence by toileting patient every 4 hours or more frequently as needed by patient.  Outcome: Progressing Goal: RH STG MAINTAIN SKIN INTEGRITY WITH ASSISTANCE Description STG Maintain Skin Integrity With moderate Assistance.  Outcome: Progressing Goal: RH STG ABLE TO PERFORM INCISION/WOUND CARE W/ASSISTANCE Description STG Able To Perform skin Care With moderate Assistance.  Outcome: Progressing   Problem: RH SAFETY Goal: RH STG ADHERE TO SAFETY PRECAUTIONS W/ASSISTANCE/DEVICE Description STG Adhere to Safety Precautions With moderate Assistance and appropriate assistive Devices.  Outcome: Progressing Goal: RH STG DECREASED RISK OF FALL WITH ASSISTANCE Description STG Decreased Risk of Fall With moderate Assistance while following CIR's new standard toileting protocol of every 4 hours.  Outcome: Progressing   Problem: RH PAIN MANAGEMENT Goal: RH STG PAIN MANAGED AT OR BELOW PT'S PAIN GOAL Description < 3 on a 0-10  pain scale  Outcome: Progressing   Problem: RH KNOWLEDGE DEFICIT Goal: RH STG INCREASE KNOWLEDGE OF HYPERTENSION Description Patient and spouse will be able to demonstrate knowledge of new HTN medications, including dosage amounts, dosages per day, and follow-up care with the MD post discharge with min assist from rehab staff.  Outcome: Progressing Goal: RH STG INCREASE KNOWLEDGE OF DYSPHAGIA/FLUID INTAKE Description Patient and spouse will be able to demonstrate knowledge of safe swallowing techniques, and proper techniques for thickening liquids with min assist from rehab staff.  Outcome: Progressing   

## 2017-09-30 NOTE — Progress Notes (Signed)
Physical Therapy Session Note  Patient Details  Name: Walter Bryant MRN: [id number] Date of Birth: [birth year]  Today's Date: 09/30/2017 PT Individual Time: 9242-6834 PT Individual Time Calculation (min): 40 min   Short Term Goals: Week 2:  PT Short Term Goal 1 (Week 2): Pt will ambulate 10 ft with max assist with therapy only, for strengthening & NMR. PT Short Term Goal 2 (Week 2): Pt will tolerate standing x 10 minutes for weight bearing and NMR. PT Short Term Goal 3 (Week 2): Pt will propel w/c 125 ft with supervision.  Skilled Therapeutic Interventions/Progress Updates:  Pt received in TIS w/c appearing more alert but pt stating "I don't think I fit in with this hospital program" with continued speech but difficulty understanding pt & following his train of thought with this continuing throughout session 2/2 pt's impaired focused attention. Transported pt throughout unit via w/c total assist. Pt utilized dynavision from w/c level with LUE only with task focusing on anterior weight shifting; pt with difficulty pressing R lights near shoulder height and to R of midline 2/2 hx of L shoulder arthritis. Pt able to perform task for 2 minutes x 2 trials but required prolonged rest break between each trial 2/2 fatigue & pt reporting 5/10 fatigue after activity. Pt set up at [facility] from w/c level with RLE only with therapist controlling other pedal and manually facilitating neutral alignment RLE with task focusing on RLE strengthening & NMR; pt only able to perform a few repetitions at a time with max cuing to continue before fatiguing and minimal muscle activation noted. HR & SpO2 WNL but pt observed and appears to have apneic episodes - PA made aware. At end of session pt left reclined in TIS w/c, quick release belt donned, and call bell in lap.  Therapy Documentation Precautions:  Precautions Precautions: Fall, Other (comment) Precaution Comments: R hemi, lateral lean and pushes to the R;  Cortrak Required Braces or Orthoses: Other Brace/Splint Other Brace/Splint: bilateral upright AFO's (used PTA. 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   Therapy/Group: Individual Therapy  [name] 09/30/2017, 10:33 AM

## 2017-09-30 NOTE — Progress Notes (Addendum)
Subjective/Complaints:  Slept ok, still with hiccups, no SOB, no abd pain Appetite remains poor, doesn't like D2 diet  ROS: Limited due to cognitive/behavioral , no pains no breathing problems , no nausea or vomiting,   Objective: Vital Signs: Blood pressure 139/83, pulse 75, temperature 98.7 F (37.1 C), temperature source Oral, resp. rate 18, height 6\' 2"  (1.88 m), weight 98.2 kg (216 lb 7.9 oz), SpO2 99 %. No results found. No results found for this or any previous visit (from the past 72 hour(s)).   Constitutional: No distress . Vital signs reviewed. HEENT: EOMI, oral membranes moist Neck: supple Cardiovascular: RRR without murmur. No JVD    Respiratory: CTA Bilaterally without wheezes or rales. Normal effort    GI: BS +, non-tender, non-distended  Skin:   Other IV sites and nares CDI Neuro:  Alert, follows simple commands.  Decreased insight and awareness., Abnormal Sensory Reduced bilateral below ankles, Abnormal Motor 3-/5 RUE grip, 2- RUE Bicepts and Triceps and 2- RLE hip/knee extensor synergy as well as plantarflexion.  Moderately dysarthric Musc/Skel:  Other mild pain with Right shoulder ext rotation, neg impingement sign Psych: Remains restless/anxious  Assessment/Plan: 1. 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 Physiatrist is providing close team supervision and 24 hour management of active medical problems listed below. Physiatrist and rehab team continue to assess barriers to discharge/monitor patient progress toward functional and medical goals. FIM: Function - Bathing Position: Bed Body parts bathed by helper: Right arm, Left arm, Chest, Abdomen, Front perineal area, Buttocks, Right upper leg, Left upper leg, Right lower leg, Left lower leg, Back Assist Level: 2 helpers  Function- Upper Body Dressing/Undressing What is the  patient wearing?: Pull over shirt/dress Pull over shirt/dress - Perfomed by patient: Pull shirt over trunk, Put head through opening Pull over shirt/dress - Perfomed by helper: Thread/unthread left sleeve, Thread/unthread right sleeve, Put head through opening Assist Level: Touching or steadying assistance(Pt > 75%) Function - Lower Body Dressing/Undressing What is the patient wearing?: Pants Position: Bed Pants- Performed by helper: Thread/unthread right pants leg, Thread/unthread left pants leg, Pull pants up/down Socks - Performed by helper: Don/doff right sock, Don/doff left sock Shoes - Performed by helper: Don/doff right shoe, Don/doff left shoe, Fasten right, Fasten left AFO - Performed by helper: Don/doff right AFO, Don/doff left AFO TED Hose - Performed by helper: Don/doff right TED hose, Don/doff left TED hose Assist for footwear: Maximal assist Assist for lower body dressing: 2 Helpers  Function - Toileting Toileting activity did not occur: Safety/medical concerns Toileting steps completed by helper: Adjust clothing prior to toileting, Performs perineal hygiene, Adjust clothing after toileting Toileting Assistive Devices: Other (comment)(Using [employer]) Assist level: Two helpers  Function - Air cabin crew transfer activity did not occur: Safety/medical concerns Toilet transfer assistive device: Facilities manager lift: Garment/textile technologist level to toilet: 2 helpers Assist level from toilet: 2 helpers  Function - Chair/bed transfer Chair/bed transfer [place]: Lateral scoot Chair/bed transfer assist level: 2 helpers Chair/bed transfer assistive device: Sliding board Chair/bed transfer details: Tactile cues for initiation, Tactile cues for sequencing, Tactile cues for weight shifting, Tactile cues for posture, Tactile cues for placement, Verbal cues for sequencing, Verbal cues for technique, Manual facilitation for placement, Manual facilitation for weight  shifting  Function - Locomotion: Wheelchair Will patient use wheelchair at discharge?: Yes Type: Manual Max wheelchair distance: 90 ft  Assist Level: Supervision  or verbal cues Assist Level: Supervision or verbal cues Assist Level: Dependent (Pt equals 0%) Turns around,maneuvers to table,bed, and toilet,negotiates 3% grade,maneuvers on rugs and over doorsills: No Function - Locomotion: Ambulation Ambulation activity did not occur: Safety/medical concerns Assistive device: Rail in hallway Max distance: 2 Assist level: 2 helpers Walk 10 feet activity did not occur: Safety/medical concerns Walk 50 feet with 2 turns activity did not occur: Safety/medical concerns Walk 150 feet activity did not occur: Safety/medical concerns Walk 10 feet on uneven surfaces activity did not occur: Safety/medical concerns  Function - Comprehension Comprehension: Auditory Comprehension assistive device: Hearing aids Comprehension assist level: Understands basic 90% of the time/cues < 10% of the time  Function - Expression Expression: Verbal Expression assist level: Expresses basic 50 - 74% of the time/requires cueing 25 - 49% of the time. Needs to repeat parts of sentences.  Function - Social Interaction Social Interaction assist level: Interacts appropriately 90% of the time - Needs monitoring or encouragement for participation or interaction.  Function - Problem Solving Problem solving assist level: Solves basic 50 - 74% of the time/requires cueing 25 - 49% of the time  Function - Memory Memory assistive device: Memory book Memory assist level: Recognizes or recalls 50 - 74% of the time/requires cueing 25 - 49% of the time Patient normally able to recall (first 3 days only): Current season, Staff names and faces, That he or she is in a hospital  Medical Problem List and Plan: 1.  Right side weakness with dysarthria secondary to multiple bilateral infarcts (left BG, tiny Right frontal and bilateral   occipital infarcts) consistent with cardiac emboli with history of atrial fibrillation.Onset 5/29 Motor strength improving in RUE,discussed progress with family  Continue CIR PT OT speech, 2.  DVT Prophylaxis/Anticoagulation: Eliquis 3. Pain Management/polyneuropathy: Tylenol as needed 4. Mood/sleep/confusion: BuSpar 10 mg twice daily,   -increased confusion with 15mg  restoril--- this was stopped on 6/8-avoid benzos  -Slept a few hours last night on Seroquel 12.5 mg.  Discontinue standing dose 25 mg tonight with prn 12.5 mg dose   With complaints of anxiety and insomnia as well as decreased appetite would start Remeron  potential history of depression Incomplete sleep graph 5. Neuropsych: This patient is capable of making decisions on his own behalf. 6. Skin/Wound Care: Routine skin checks 7. Fluids/Electrolytes/Nutrition: BMET normal 6/4  -Appreciate dietary consult  8.  Dysphasia.  Dysphasia #2 nectar liquids.Cortrak tube feeds for nutritional support.  Check calorie counts.    Repeat modified this week as per speech therapy    -trial Megace ongoing with sluggish results so far. With AMS above, will go ahead and hold for now as well   -H20 boluses 9.  History of atrial fibrillation.  Continue Eliquis 5mg  BID.  Cardiac rate controlled, on metoprolol PTA 10.  Hyperlipidemia.  Zocor on lipitor PTA 11.  History of polyneuropathy.  Patient uses bilateral AFO braces prior to admission.  No up with her deficits however 12.  Hx HTN lasix and Lisinopril PTA Vitals:   09/29/17 2211 09/30/17 0416  BP: 123/81 139/83  Pulse: 80 75  Resp: 18 18  Temp: 98.6 F (37 C) 98.7 F (37.1 C)  SpO2: 97% 99%  BPs controlled 6/14 13.  Hx of GERD-  Pepcid to 40mg  BID, added protonix 40mg  daily, improved 14.  Nausea without vomiting, question medication versus tube feeding versus constipation.  Appears to have improved partially 15.  Constipation improving per pt 16.  Urinary retention  likely  multifactorial immobility and prob BPH,   flomax increased to home dose 0.8mg , added urecholine will titrate upward to QID may need to go up to 25mg   -Urine culture positive enterococcus on amoxicillin-hopefully treatment of this may help with some confusion. 17.  Insomnia, sleep wake cycle disruption improving, continue Remeron, cont Ritalin breakfast and lunch 18.  Hiccups trial baclofen, monitor for sedation start 5mg  BID slowly titrate Daughter who is [phone]-LOS (Days) Harmony E Kirsteins 09/30/2017, 8:26 AM

## 2017-09-30 NOTE — Progress Notes (Signed)
Occupational Therapy Session Note  Patient Details  Name: Walter Bryant MRN: [id number] Date of Birth: 11/10/1930  Today's Date: 09/30/2017 OT Individual Time: 1300-1400 OT Individual Time Calculation (min): 60 min    Short Term Goals: Week 2:  OT Short Term Goal 1 (Week 2): Pt will consistently complete sliding board transfers with max A +1 in order to reduce caregiver burden.  OT Short Term Goal 2 (Week 2): Pt will don shirt with mod A seated EOB. OT Short Term Goal 3 (Week 2): Pt will tolerate 5 minutes of therapeutic activity without need for rest break in order to increase functional activity tolerance  Skilled Therapeutic Interventions/Progress Updates:    Pt seen for OT session focusing on functional transfers and neuro re-ed with e-stim. Pt in supine upon arrival, voicing need to have BM.  SARA plus used to transfer to [name] with +2 assist. Pt tolerated sitting on BSC much better this session compared to previous attempts at [facility]. Pt unable to void. Transferred to tilt in space w/c. In therapy gym, pt completed +2 max A sliding board transfer to EOM. Pt initially supervision for sitting EOM for ~15 seconds before fatiguing and requiring max A sitting balance due to posterior bias. He was placed in side-lying and completed e-stim for shoulder sublux, see details below. Pt returned to room at end of session, left sitting in w/c with wife present, all needs in reach.  Discussed with PA pt's possible sleep apnea and functional implications, pt demonstrating labored deep breathing when he fell asleep in side-lying during e-stim. VCs required throughout session for deep breathing techniques.  Pt's wife present for session, cont with education regarding POC, pt's progress, and d/c planning  1:1 NMES applied to supraspinatus and middle deltoid to help approximate shoulder joint to reduce sublux and reduce pain.   Ratio 1:3 Rate 35 pps Waveform- Asymmetric Ramp 1.0 Pulse 300 Intensity-  30 Duration -   10 minutes  No adverse reactions after treatment and is skin intact.   Therapy Documentation Precautions:  Precautions Precautions: Fall, Other (comment) Precaution Comments: R hemi, lateral lean and pushes to the R; Cortrak Required Braces or Orthoses: Other Brace/Splint Other Brace/Splint: bilateral upright AFO's (used PTA. Has h/o charcot foot on Bryant) Restrictions Weight Bearing Restrictions: No Pain: Pain Assessment Pain Scale: 0-10 Pain Score: 0-No pain ADL: ADL ADL Comments: see functional navigator  See Function Navigator for Current Functional Status.   Therapy/Group: Individual Therapy  Walter Bryant 09/30/2017, 6:57 AM

## 2017-09-30 NOTE — Progress Notes (Signed)
Patient remains on scheduled sleeping chart; patient slept 7 hrs this shift; no distress observed; sitter at bedside; meds as ordered; toileting as ordered. Will continue to monitor

## 2017-09-30 NOTE — Progress Notes (Signed)
Speech Language Pathology Daily Session Note  Patient Details  Name: Walter Bryant MRN: [id number] Date of Birth: 09/28/1930  Today's Date: 09/30/2017 SLP Individual Time: 2482-5003 SLP Individual Time Calculation (min): 45 min  Short Term Goals: Week 2: SLP Short Term Goal 1 (Week 2): Pt will particpate in instrumental swallow assessment to determine least restrictive diet. SLP Short Term Goal 2 (Week 2): Pt will consume dys 3 trials with efficient mastication and oral clearnace without overt s/s aspiration to demonstrate readiness of solid upgrade. SLP Short Term Goal 3 (Week 2): Pt will recall new, daily information with Min A verbal cues for external aids. SLP Short Term Goal 4 (Week 2): Pt will demonstrate 70% intelligibility at phrase level with Mod A verbal cues for use of speech intelligibility strategies.  SLP Short Term Goal 4 - Progress (Week 2): Revised due to lack of progress SLP Short Term Goal 5 (Week 2): Pt will demonstrate sustained attention to functional tasks for 10 minutes with Mod A verbal cues for redirection. SLP Short Term Goal 6 (Week 2): Pt will demonstrate functional problem solving in basic and familiar tasks with Mod A verbal cues. SLP Short Term Goal 6 - Progress (Week 2): Revised due to lack of progress  Skilled Therapeutic Interventions:  Pt was seen for skilled ST targeting goals for dysphagia and communication.  Pt was much more alert and talkative this morning.  He needed mod assist verbal cues for slow rate, increased vocal intensity, and overarticulation to achieve intelligibility at the sentence level.  Pt was agreeable to getting out of bed for therapies and was transferred to tilt in space via Clarise Cruz lift.  Pt consumed presentations of his currently prescribed diet with supervision cues to correct anterior spillage of boluses.  No overt s/s of aspiration were evident with solids or nectar thick liquids.  Given improved mentation and endurance, would  recommend resuming trials of water with and without chin tuck to work towards diet progression.  Pt left in tilt in space with call bell within reach and quick release belt donned for safety.  Continue per current plan of care.    Function:  Eating Eating   Modified Consistency Diet: Yes Eating Assist Level: Supervision or verbal cues;Set up assist for   Eating Set Up Assist For: Opening containers       Cognition Comprehension Comprehension assist level: Understands basic 90% of the time/cues < 10% of the time  Expression   Expression assist level: Expresses basic 50 - 74% of the time/requires cueing 25 - 49% of the time. Needs to repeat parts of sentences.  Social Interaction Social Interaction assist level: Interacts appropriately 90% of the time - Needs monitoring or encouragement for participation or interaction.  Problem Solving Problem solving assist level: Solves basic 50 - 74% of the time/requires cueing 25 - 49% of the time  Memory Memory assist level: Recognizes or recalls 50 - 74% of the time/requires cueing 25 - 49% of the time    Pain Pain Assessment Pain Scale: 0-10 Pain Score: 0-No pain  Therapy/Group: Individual Therapy  Tamiko Leopard, Selinda Orion 09/30/2017, 8:20 AM

## 2017-09-30 NOTE — Progress Notes (Signed)
Question of OSA per multiple therapist and family. Will order pulse ox at nights

## 2017-10-01 ENCOUNTER — Inpatient Hospital Stay (HOSPITAL_COMMUNITY): Payer: Medicare Other | Admitting: Physical Therapy

## 2017-10-01 ENCOUNTER — Inpatient Hospital Stay (HOSPITAL_COMMUNITY): Payer: Medicare Other

## 2017-10-01 MED ORDER — BUSPIRONE HCL 5 MG PO TABS
5.0000 mg | ORAL_TABLET | Freq: Once | ORAL | Status: AC
  Administered 2017-10-01: 5 mg via ORAL

## 2017-10-01 MED ORDER — BUSPIRONE HCL 5 MG PO TABS
5.0000 mg | ORAL_TABLET | Freq: Two times a day (BID) | ORAL | Status: DC
  Administered 2017-10-01 – 2017-10-03 (×4): 5 mg via ORAL
  Filled 2017-10-01 (×4): qty 1

## 2017-10-01 NOTE — Progress Notes (Signed)
Occupational Therapy Session Note  Patient Details  Name: Walter Bryant MRN: [id number] Date of Birth: 1931/03/11  Today's Date: 10/01/2017 OT Individual Time: 4492-0100 OT Individual Time Calculation (min): 56 min    Short Term Goals: Week 1:  OT Short Term Goal 1 (Week 1): Pt will perform slide board transfer bed<>chair with max A+2 with initiation of foward weight shift OT Short Term Goal 1 - Progress (Week 1): Met OT Short Term Goal 2 (Week 1): Pt will don shirt with max A  OT Short Term Goal 2 - Progress (Week 1): Met OT Short Term Goal 3 (Week 1): Pt will maintain static sitting balance with min A for ~5 min in prep for functional tasks  OT Short Term Goal 3 - Progress (Week 1): Progressing toward goal OT Short Term Goal 4 (Week 1): Pt will roll in bed to the right with mod A to help with LB clothing management and hygiene OT Short Term Goal 4 - Progress (Week 1): Met  Skilled Therapeutic Interventions/Progress Updates:    1;1. Pt asking where he is upon entering room. OT reoriented pt to place and situation. OT dons pants total A in supine rolling B to advance pants past hips with S-mod A. Pt supine>sitting EOB with MAX A of 1. Pt transfers into TIS with sara +2 A. Pt bathes UB at sink with HOH A of RUE to wash LUE and A to wash back. Pt grooms (oral care with suction and shave with electric razor) with VC for terminating tasks. Pt dons pull over shirt with MAX A and instructional cues for hemi techniques. Eixted session with pt tilted back in TIS, call light in reach and call bell in lap.   Therapy Documentation Precautions:  Precautions Precautions: Fall, Other (comment) Precaution Comments: R hemi, lateral lean and pushes to the R; Cortrak Required Braces or Orthoses: Other Brace/Splint Other Brace/Splint: bilateral upright AFO's (used PTA. Has h/o charcot foot on L) Restrictions Weight Bearing Restrictions: No General:   Vital Signs:    See Function Navigator for  Current Functional Status.   Therapy/Group: Individual Therapy  Tonny Branch 10/01/2017, 8:57 AM

## 2017-10-01 NOTE — Progress Notes (Signed)
Physical Therapy Session Note  Patient Details  Name: Walter Bryant MRN: [id number] Date of Birth: Jul 16, 1930  Today's Date: 10/01/2017 PT Individual Time: 8675-4492 PT Individual Time Calculation (min): 30 min   Short Term Goals: Week 2:  PT Short Term Goal 1 (Week 2): Pt will ambulate 10 ft with max assist with therapy only, for strengthening & NMR. PT Short Term Goal 2 (Week 2): Pt will tolerate standing x 10 minutes for weight bearing and NMR. PT Short Term Goal 3 (Week 2): Pt will propel w/c 125 ft with supervision.  Skilled Therapeutic Interventions/Progress Updates:    Pt semi-reclined in bed asleep, unable to arouse with cold cloth to the forehead, sternal rub, etc. RN present as patient has medication he needs to take. Semi-reclined to sitting EOB with total A x 2. Pt finally wakes up and opens eyes once seated EOB. Max A sitting balance EOB due to posterior lean and pt fatigue level. Pt is able to swallow medication in pudding from RN. Sit to supine total A x 2. Adjusted pt's w/c leg rests due to length making pt's legs uncomfortable while seated in chair, unable to lengthen past one notch so will continue to assess best method for improving patient comfort while seated in w/c. Pt left semi-reclind in bed with HOB elevated to 30 degrees, needs in reach, bed alarm in place.  Therapy Documentation Precautions:  Precautions Precautions: Fall, Other (comment) Precaution Comments: R hemi, lateral lean and pushes to the R; Cortrak Required Braces or Orthoses: Other Brace/Splint Other Brace/Splint: bilateral upright AFO's (used PTA. Has h/o charcot foot on L) Restrictions Weight Bearing Restrictions: No  See Function Navigator for Current Functional Status.   Therapy/Group: Individual Therapy  Excell Seltzer, PT, DPT  10/01/2017, 3:55 PM

## 2017-10-01 NOTE — Progress Notes (Signed)
Pt awakes aggitated, has hiccups, pulse ox had been running 98-100%, pt pulls off pulse ox probe and yelling he wants to get up as it is 8 in the morning and he is not sleepy.  Reoriented pt but he refuses to believe the time.  Pt c/o generalized discomfort, tylenol and seroquel given as ordered.  RN and pt's bedside sitter attempt to calm pt., he has some pudding and water and settles down.

## 2017-10-01 NOTE — Progress Notes (Signed)
Physical Therapy Session Note  Patient Details  Name: Walter Bryant MRN: [id number] Date of Birth: 05/29/1930  Today's Date: 10/01/2017 PT Individual Time: 1100-1155 PT Individual Time Calculation (min): 55 min   Short Term Goals: Week 2:  PT Short Term Goal 1 (Week 2): Pt will ambulate 10 ft with max assist with therapy only, for strengthening & NMR. PT Short Term Goal 2 (Week 2): Pt will tolerate standing x 10 minutes for weight bearing and NMR. PT Short Term Goal 3 (Week 2): Pt will propel w/c 125 ft with supervision.  Skilled Therapeutic Interventions/Progress Updates:    Pt received seated in TIS w/c in room, complains of BLE pain. Other that reporting "aching" sensation is unable to further describe pain. Pt also appears more confused today and thinks he is in [place]. Per pt's son's report he did not sleep well last night. Sliding board transfer w/c to mat table with total A x 2, pt requires max v/c for head/hips relationship and weight shift to complete transfer but due to lethargy this date is unable to provide assist. Sit to/from supine total A x 2. Supine BLE stretches, pt unable to describe if stretches are increasing pain in BLE. Pt reports having trouble breathing in supine, returned to sitting EOM. SpO2 98% and HR 84, instructed pt in pursed lip breathing techniques. Sitting balance EOM reaching with LUE, max manual cues for forward weight shift reaching for targets outside BOS. Sliding board transfer mat table to w/c max-total A x 2. Pt requests to remain sitting in TIS chair so he can attempt to eat lunch today. Pt left reclined in TIS w/c in room, needs in reach.  Therapy Documentation Precautions:  Precautions Precautions: Fall, Other (comment) Precaution Comments: R hemi, lateral lean and pushes to the R; Cortrak Required Braces or Orthoses: Other Brace/Splint Other Brace/Splint: bilateral upright AFO's (used PTA. Has h/o charcot foot on L) Restrictions Weight Bearing  Restrictions: No  See Function Navigator for Current Functional Status.   Therapy/Group: Individual Therapy  Excell Seltzer, PT, DPT  10/01/2017, 12:46 PM

## 2017-10-01 NOTE — Progress Notes (Signed)
Pt awakes and wants to "use bathroom", urinal offered, pt becomes aggitated, grabs NT's arm, then crosses legs refusing to be helped with the urinal.  RN attempts to help pt with urinal and then stated he will need to be cathed if he is unable to void, pt then grabs RN's shirt and grips tightly pulling her toward him and yelling he will not be treated like this.  RN able to calm pt into settling back to sleep, will wait for a bit to cath as 6 hour mark is at 0345.

## 2017-10-01 NOTE — Progress Notes (Signed)
Pt aggitated during I&O cath, spilled container with sudden movements and tries to punch nurses as he swings his fist at [place].

## 2017-10-01 NOTE — Progress Notes (Signed)
Subjective/Complaints: Patient is confused this morning.  He denies pain.  He wonders where he is.  There is no evidence of hiccups this morning.  He does denies shortness of breath.  Wife and son are in the room today.  Objective: Vital Signs: Blood pressure 119/81, pulse 78, temperature 97.7 F (36.5 C), temperature source Oral, resp. rate 20, height 6\' 2"  (1.88 m), weight 216 lb 7.9 oz (98.2 kg), SpO2 100 %.  Elderly male in no acute distress.  HEENT exam atraumatic, normocephalic.  He does have a feeding tube in his nose.  Neck is supple. Chest clear to auscultation Cardiac exam S1 and S2 regular Abdominal exam overweight, active bowel sounds, soft Extremities no edema.  Assessment/Plan: 1. 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  Multiple bilateral infarcts.  Most consistent with cardiac emboli.  Has history of atrial fibrillation.  He has significant right-sided weakness and dysarthria.  He also has some confusion. 2.  DVT prophylaxis with Eliquis. 3. Pain Management/polyneuropathy: Tylenol as needed 4. Mood/sleep/confusion: BuSpar 10 mg twice daily,   -increased confusion with 15mg  restoril--- this was stopped on 6/8-avoid benzos  -Slept a few hours last night on Seroquel 12.5 mg.  Discontinue standing dose 25 mg tonight with prn 12.5 mg dose   With complaints of anxiety and insomnia as well as decreased appetite would start Remeron  potential history of depression Incomplete sleep graph This continues to be a problem.  I wonder how much of his confusion is related to medications.  I will review medications try to discontinue as many as possible and continue something for sleep at least on an as-needed basis. 5. Neuropsych: At this time he is not capable of making his own decisions. 6. Skin/Wound Care: Routine skin checks 7. Fluids/Electrolytes/Nutrition: Bmet normal on 6 4.  8.  Dysphasia.   Nectar thick  liquids.  Tube feeds for nutritional support. 9.  History of atrial fibrillation.  Continue Eliquis 5mg  BID.  Cardiac rate controlled, on metoprolol PTA 10.  Hyperlipidemia.  Zocor on lipitor PTA 11.  History of polyneuropathy.  Patient uses bilateral AFO braces prior to admission.  No up with her deficits however 12.  Hx HTN adequate control. Vitals:   09/30/17 2330 10/01/17 0450  BP:  119/81  Pulse:  78  Resp:  20  Temp:  97.7 F (36.5 C)  SpO2: 98% 100%   13.  Hx of GERD-  Pepcid to 40mg  BID, added protonix 40mg  daily, improved In an effort to minimize medications I will discontinue famotidine. 14.  Nausea patient denies nausea. 15.  Constipation improving per pt 16.  Urinary retention has known BPH.  Also known urinary tract infection.  We will continue to treat both. 17.  Insomnia, sleep wake cycle disruption improving, continue Remeron, cont Ritalin breakfast and lunch 18.  Hiccups trial baclofen, monitor for sedation start 5mg  BID slowly titrate Daughter who is [phone] (Days) 12 A FACE TO FACE EVALUATION WAS PERFORMED  Abed Schar H Mareo Portilla 10/01/2017, 9:31 AM

## 2017-10-01 NOTE — Progress Notes (Signed)
Continuous pulse ox placed and instructed pt in use and reason why Dr had wanted it placed, verb understanding, pt request probe be placed on his earlobe instead of his finger. Pulse ox 100% on room air.

## 2017-10-02 ENCOUNTER — Inpatient Hospital Stay (HOSPITAL_COMMUNITY): Payer: Medicare Other

## 2017-10-02 ENCOUNTER — Inpatient Hospital Stay (HOSPITAL_COMMUNITY): Payer: Medicare Other | Admitting: Physical Therapy

## 2017-10-02 NOTE — Progress Notes (Signed)
Patient has been anxious and restless throughout the morning. With multiple reports of dry mouth and being thirsty. Oral care and fluids administered upon request. Pt has slept approximately 4 hours.

## 2017-10-02 NOTE — Progress Notes (Signed)
Physical Therapy Session Note  Patient Details  Name: Walter Bryant MRN: [id number] Date of Birth: 03/08/1931  Today's Date: 10/02/2017 PT Individual Time: 1300-1400 PT Individual Time Calculation (min): 60 min   Short Term Goals: Week 2:  PT Short Term Goal 1 (Week 2): Pt will ambulate 10 ft with max assist with therapy only, for strengthening & NMR. PT Short Term Goal 2 (Week 2): Pt will tolerate standing x 10 minutes for weight bearing and NMR. PT Short Term Goal 3 (Week 2): Pt will propel w/c 125 ft with supervision.  Skilled Therapeutic Interventions/Progress Updates:    reports "wedgie pain" from brief and positioning in chair.  PT repositioned pt with some noted relief.  Session focus on activity tolerance and NMR via upright tolerance and bed mobility.    Pt requires max assist and increased time/verbal cues for forward weight shift in w/c to reposition hips for optimum pressure relief and increased sitting tolerance.  Attempted transfer to tilt table with hoyer, however once on table pt noted to have incontinent BM so returned to room in w/c total assist.  Transfer back to bed with hoyer to protect skin integrity with soiled brief.  NMR via rolling L and R for total assist for hygiene and donning clean brief/draw sheet.  Pt requires min assist with min multimodal cues for rolling to his R side, with facilitation for safe positioning of RUE.  Able to maintain R side lying with supervision.  Pt requires max assist to roll to L side with mod multimodal cues for protection of RUE and use of RLE to push, as well as facilitation for RUE/RLE positioning for pt to maintain side lying.  Requires +2 to scoot to HOB with draw sheet.  Pt missed 15 minutes at end of session due to fatigue.  Positioned to comfort with call bell in reach and needs met.   Therapy Documentation Precautions:  Precautions Precautions: Fall, Other (comment) Precaution Comments: R hemi, lateral lean and pushes to the R;  Cortrak Required Braces or Orthoses: Other Brace/Splint Other Brace/Splint: bilateral upright AFO's (used PTA. Has h/o charcot foot on L) Restrictions Weight Bearing Restrictions: No General: PT Amount of Missed Time (min): 15 Minutes PT Missed Treatment Reason: Patient fatigue  See Function Navigator for Current Functional Status.   Therapy/Group: Individual Therapy   E  10/02/2017, 1:53 PM  

## 2017-10-02 NOTE — Progress Notes (Signed)
Pt c/o unable to sleep and feeling anxious, wants something for sleep and anxiety, pt is appearing to be dozing at times for a few minutes then opens his eyes and states he can't sleep.   let pt know he already had his seroquel and remeron, pt gets aggitated and states he did not take both of them, however, I had told him earlier that he was taking them.  Pt refuses tylenol.  call to Dr Inda Merlin, no new orders as he does not want pt to be overly sedated.  Pt calls again to have something, informed him that Dr Inda Merlin does not want him to have anything, pt refuses tylenol.

## 2017-10-02 NOTE — Progress Notes (Signed)
Occupational Therapy Session Note  Patient Details  Name: Walter Bryant MRN: [id number] Date of Birth: 1930/05/23  Today's Date: 10/02/2017 OT Individual Time: 4144-3601 OT Individual Time Calculation (min): 71 min    Short Term Goals: Week 2:  OT Short Term Goal 1 (Week 2): Pt will consistently complete sliding board transfers with max A +1 in order to reduce caregiver burden.  OT Short Term Goal 2 (Week 2): Pt will don shirt with mod A seated EOB. OT Short Term Goal 3 (Week 2): Pt will tolerate 5 minutes of therapeutic activity without need for rest break in order to increase functional activity tolerance  Skilled Therapeutic Interventions/Progress Updates:    1:1. Pt supine requesting to change clothing. No c/o pain. Pt completes rolling B with min A for R and MOD-MAX A to L as OT doffs old pants and dons new shorts. Pt supine>sitting EOB with MAX A for LLE management and trunk facilitation. Pt with posterior lean requiring VC for anterior trunk flexion. Pt transfers into TIS with TOTAL A in stedy for BLE weiht bearing. Pt completes grooming at sink with HOH A to facilitate grasp/realease/reaching/weight bearing of RUE throughout oral care, hair brushing, washing face, and washing hands. Pt able to locate all grooming items needed on L with increased time and no cues. Pt requires significantly increased time and subtle cues to turn off faucet. OT dons teds and non slip socks total A while RN administering medications Pt completes UB dressing with overall MAX A to thread BUE (watch stuck on LUE), and pull down back. Exited session wihtpt seated in w/c call light in reach and all needs met  Therapy Documentation Precautions:  Precautions Precautions: Fall, Other (comment) Precaution Comments: R hemi, lateral lean and pushes to the R; Cortrak Required Braces or Orthoses: Other Brace/Splint Other Brace/Splint: bilateral upright AFO's (used PTA. Has h/o charcot foot on L) Restrictions Weight  Bearing Restrictions: No General:   Vital Signs:   Pain:  See Function Navigator for Current Functional Status.   Therapy/Group: Individual Therapy  Tonny Branch 10/02/2017, 10:25 AM

## 2017-10-02 NOTE — Progress Notes (Signed)
Social Work Patient ID: Walter Bryant, male   DOB: January 26, 1931, 84 y.o.   MRN: [id number]   CSW met with pt, pt's wife and dtr Walter Bryant, and his other dtr, Walter Bryant was on speaker phone to update them on team conference discussion.  Pt's family plans to meet with [facility] on 09-30-17 to learn pt's options for tx to skilled area following CIR.  They will let CSW know outcome of meeting and will put CSW in touch with [facility] staff.  Pt/family acknowledge that pt's had a worse week then the previous.  They remain hopeful that things improve for pt and CSW told them the many factors Dr. Letta Pate feels are contributing to this.  Answered their questions and will f/u next week with them about [facility] options.  Explained that 10/03/2017 is a targeted d/c date, but that could be a different date based on pt's progress/participation, medical needs, and [facility] availability.  CSW will continue to follow and assist as needed.

## 2017-10-02 NOTE — Patient Care Conference (Signed)
Inpatient RehabilitationTeam Conference and Plan of Care Update Date: 09/28/2017   Time: 10:40 AM    Patient Name: [facility]      Medical Record Number: [id number]  Date of Birth: [birth year] Sex: Male         Room/Bed: 4W26C/4W26C-01 Payor Info: Payor: MEDICARE / Plan: MEDICARE PART A AND B / Product Type: *No Product type* /    Admitting Diagnosis: CVA  Admit Date/Time:  09/19/2017  6:45 PM Admission Comments: No comment available   Primary Diagnosis:  <principal problem not specified> Principal Problem: <principal problem not specified>  Patient Active Problem List   Diagnosis Date Noted  . Basal ganglia stroke ([place]) 09/19/2017  . Dysphagia, post-stroke   . PAF (paroxysmal atrial fibrillation) ([place])   . Hyperlipidemia   . Goals of care, counseling/discussion   . Hiccoughs   . Palliative care by specialist   . Acute ischemic stroke ([place]) 09/14/2017  . HTN (hypertension) 09/14/2017  . A-fib ([place]) 09/14/2017  . Hyponatremia 08/17/2017  . Fluid overload 08/16/2017  . Anxiety state 08/16/2017  . CAP (community acquired pneumonia) 08/15/2017  . Polyneuropathy in other diseases classified elsewhere ([place]) 12/20/2013  . Abnormality of gait 12/20/2013  . Non-healing right groin open wound 04/09/2013    Expected Discharge Date: Expected Discharge Date: (SNF)  Team Members Present: Physician leading conference: Dr. Alysia Penna Social Worker Present: Alfonse Alpers, LCSW Nurse Present: Dorthula Nettles, RN PT Present: Lavone Nian, PT OT Present: Napoleon Form, OT SLP Present: Windell Moulding, SLP PPS Coordinator present : Daiva Nakayama, RN, CRRN     Current Status/Progress Goal Weekly Team Focus  Medical   Poor oral intake, continues to feeding at night, poor sleep, confusion related to medications, urinary retention improved, but incontinent  Adequate p.o. intake of medication fluids and calories, improved sleep  Sleep medication management, repeat swallow study    Bowel/Bladder   cont/incont bowel; q4-6 cath; lbm 6/11  cont BM with mod assist; successful void with mod assist  q4-6 cath; assess q shift and prn; offer toileting q3hr while awake   Swallow/Nutrition/ Hydration   dys 2 and NTL, water protocol going well  Mod I  continue water protocol and order MBS once cognition improves   ADL's   Max- +2 sliding board transfers; mod A UB dressing; total A +2 LB dressing and toileting at bed level  Mod A overall, may need to downgrade if slow progress cont  Functional activity tolerance, ADL re-training, functional transfers, functional sitting balance   Mobility   max<> total +1-2 assist, slide board transfers, standing frame, supervision w/c mobility L hemi technique, limited by confusion & fatigue this week  mod assist overall  transfers, balance, strengthening, bed mobility, NMR, activity tolerance, standing, w/c mobility, pt/family education   Communication   Max- Mod A phrase level  Mod I  speech intelligibility strategies, IMST for breath support   Safety/Cognition/ Behavioral Observations  Max A attention and problem solving, Mod A recall with external aid  Mod I  sustained attention, basic/familiar problem solving, recall with memory book   Pain   no c/o pain; tender at sites of MASD  faces <4  assess q shift and prn   Skin   fissure to medial buttocks; MASD to groin, peri, buttocks; barrier cream  free from infection and breakdown with mod assist  assess q shift    Rehab Goals Patient on target to meet rehab goals: Yes Rehab Goals Revised: none *See Care Plan and progress  notes for long and short-term goals.     Barriers to Discharge  Current Status/Progress Possible Resolutions Date Resolved   Physician    Nutrition means;Incontinence;Medical stability        Continue rehab, medication management for sleep and confusion trial of Ritalin      Nursing                  PT                    OT                  SLP                 SW                Discharge Planning/Teaching Needs:  Pt lives in independent living cottage at [place] and family plans for him to go to skilled rehab area before trying to be at home with wife and morning aide.  Defer to next venue.   Team Discussion:  Pt with stroke and chronic foot drop with polyneuropathy.  He also had pneumonia a few months ago.  He has some confusion now with some decline in function over the week which Dr. Letta Pate believes is multi-factorial - medications, UTI, poor sleep, etc.  Pt on Xanax at home and is anxious and confused at night here.  Dr. Letta Pate is trying stop some medications and will try Remeron and Ritalin.  Pt has some strength in his right hand.  Tube feeding as a supplement and decision will have to be made soon of MBS vs. g-tube.  Pt with UTI and in and out cath.  Antibiotic just changed based on results of cultures.   Pt doing a worse in therapy this week due to the above.  Over the weekend, he was able to transfer with one therapist, but now total +2 and he can't follow commands or execute tasks, not concentrating/initiating, not maintaining posture.  Pt not staying awake during OT session and is annoyed with his situation.  Pt doing well on water protocol and ST will do MBS once mentation improved.  Max-mod  A at phrase level/cognition with mod I ST goals.  Revisions to Treatment Plan:  Pt's therapy program schedule has been changed to 15/7.    Continued Need for Acute Rehabilitation Level of Care: The patient requires daily medical management by a physician with specialized training in physical medicine and rehabilitation for the following conditions: Daily direction of a multidisciplinary physical rehabilitation program to ensure safe treatment while eliciting the highest outcome that is of practical value to the patient.: Yes Daily medical management of patient stability for increased activity during participation in an intensive rehabilitation  regime.: Yes Daily analysis of laboratory values and/or radiology reports with any subsequent need for medication adjustment of medical intervention for : Neurological problems;Blood pressure problems  Aleyah Balik, Silvestre Mesi 10/02/2017, 10:35 AM

## 2017-10-02 NOTE — Progress Notes (Signed)
Social Work Patient ID: [facility], male   DOB: [birth year], 84 y.o.   MRN: [id number]     [facility], [facility]  Social Worker    Patient Care Conference  Signed  Date of Service:  09/29/2017 10:06 AM          Signed          Show:Clear all [x] Manual[x] Template[] Copied  Added by: [x] Chioma Mukherjee, Gerline Legacy, LCSW   [] Hover for details   Inpatient RehabilitationTeam Conference and Plan of Care Update Date: 09/28/2017   Time: 10:40 AM      Patient Name: Walter [facility]      Medical Record Number: [id number]  Date of Birth: [birth year] Sex: Male         Room/Bed: 4W26C/4W26C-01 Payor Info: Payor: MEDICARE / Plan: MEDICARE PART A AND B / Product Type: *No Product type* /     Admitting Diagnosis: CVA  Admit Date/Time:  09/19/2017  6:45 PM Admission Comments: No comment available    Primary Diagnosis:  <principal problem not specified> Principal Problem: <principal problem not specified>       Patient Active Problem List    Diagnosis Date Noted  . Basal ganglia stroke ([place]) 09/19/2017  . Dysphagia, post-stroke    . PAF (paroxysmal atrial fibrillation) ([place])    . Hyperlipidemia    . Goals of care, counseling/discussion    . Hiccoughs    . Palliative care by specialist    . Acute ischemic stroke ([place]) 09/14/2017  . HTN (hypertension) 09/14/2017  . A-fib ([place]) 09/14/2017  . Hyponatremia 08/17/2017  . Fluid overload 08/16/2017  . Anxiety state 08/16/2017  . CAP (community acquired pneumonia) 08/15/2017  . Polyneuropathy in other diseases classified elsewhere ([place]) 12/20/2013  . Abnormality of gait 12/20/2013  . Non-healing right groin open wound 04/09/2013      Expected Discharge Date: Expected Discharge Date: (SNF)   Team Members Present: Physician leading conference: Dr. Alysia Penna Social Worker Present: Alfonse Alpers, LCSW Nurse Present: Dorthula Nettles, RN PT Present: Lavone Nian, PT OT Present: Napoleon Form, OT SLP Present: Windell Moulding,  SLP PPS Coordinator present : Daiva Nakayama, RN, CRRN       Current Status/Progress Goal Weekly Team Focus  Medical     Poor oral intake, continues to feeding at night, poor sleep, confusion related to medications, urinary retention improved, but incontinent  Adequate p.o. intake of medication fluids and calories, improved sleep  Sleep medication management, repeat swallow study   Bowel/Bladder     cont/incont bowel; q4-6 cath; lbm 6/11  cont BM with mod assist; successful void with mod assist  q4-6 cath; assess q shift and prn; offer toileting q3hr while awake   Swallow/Nutrition/ Hydration     dys 2 and NTL, water protocol going well  Mod I  continue water protocol and order MBS once cognition improves   ADL's     Max- +2 sliding board transfers; mod A UB dressing; total A +2 LB dressing and toileting at bed level  Mod A overall, may need to downgrade if slow progress cont  Functional activity tolerance, ADL re-training, functional transfers, functional sitting balance   Mobility     max<> total +1-2 assist, slide board transfers, standing frame, supervision w/c mobility L hemi technique, limited by confusion & fatigue this week  mod assist overall  transfers, balance, strengthening, bed mobility, NMR, activity tolerance, standing, w/c mobility, pt/family education   Communication     Max- Mod A phrase level  Mod I  speech intelligibility strategies, IMST for breath support   Safety/Cognition/ Behavioral Observations   Max A attention and problem solving, Mod A recall with external aid  Mod I  sustained attention, basic/familiar problem solving, recall with memory book   Pain     no c/o pain; tender at sites of MASD  faces <4  assess q shift and prn   Skin     fissure to medial buttocks; MASD to groin, peri, buttocks; barrier cream  free from infection and breakdown with mod assist  assess q shift     Rehab Goals Patient on target to meet rehab goals: Yes Rehab Goals Revised:  none *See Care Plan and progress notes for long and short-term goals.      Barriers to Discharge   Current Status/Progress Possible Resolutions Date Resolved   Physician     Nutrition means;Incontinence;Medical stability        Continue rehab, medication management for sleep and confusion trial of Ritalin      Nursing                 PT                    OT                 SLP            SW              Discharge Planning/Teaching Needs:  Pt lives in independent living cottage at [place] and family plans for him to go to skilled rehab area before trying to be at home with wife and morning aide.  Defer to next venue.   Team Discussion:  Pt with stroke and chronic foot drop with polyneuropathy.  He also had pneumonia a few months ago.  He has some confusion now with some decline in function over the week which Dr. Letta Pate believes is multi-factorial - medications, UTI, poor sleep, etc.  Pt on Xanax at home and is anxious and confused at night here.  Dr. Letta Pate is trying stop some medications and will try Remeron and Ritalin.  Pt has some strength in his right hand.  Tube feeding as a supplement and decision will have to be made soon of MBS vs. g-tube.  Pt with UTI and in and out cath.  Antibiotic just changed based on results of cultures.   Pt doing a worse in therapy this week due to the above.  Over the weekend, he was able to transfer with one therapist, but now total +2 and he can't follow commands or execute tasks, not concentrating/initiating, not maintaining posture.  Pt not staying awake during OT session and is annoyed with his situation.  Pt doing well on water protocol and ST will do MBS once mentation improved.  Max-mod  A at phrase level/cognition with mod I ST goals.  Revisions to Treatment Plan:  Pt's therapy program schedule has been changed to 15/7.    Continued Need for Acute Rehabilitation Level of Care: The patient requires daily medical management by a physician  with specialized training in physical medicine and rehabilitation for the following conditions: Daily direction of a multidisciplinary physical rehabilitation program to ensure safe treatment while eliciting the highest outcome that is of practical value to the patient.: Yes Daily medical management of patient stability for increased activity during participation in an intensive rehabilitation regime.: Yes Daily analysis of laboratory values and/or radiology reports  with any subsequent need for medication adjustment of medical intervention for : Neurological problems;Blood pressure problems   Mccayla Shimada, Silvestre Mesi 10/02/2017, 10:35 AM

## 2017-10-02 NOTE — Progress Notes (Signed)
Subjective/Complaints: Patient is much clearer this morning.  He is frustrated with his own anxiety.  He is frustrated with his lack of sleep last night.  He is able to communicate his medical history.  He knows where he is.  He knows that he suffered a stroke.  Son is in the room today.  Objective: Vital Signs: Blood pressure 131/78, pulse 84, temperature 98.7 F (37.1 C), temperature source Oral, resp. rate 20, height 6\' 2"  (1.88 m), weight 216 lb 7.9 oz (98.2 kg), SpO2 93 %.   Elderly male in no acute distress.  He does have a feeding tube in his nose.  Neck is supple.  Chest clear to auscultation cardiac exam S1-S2 regular abdominal exam overweight, active bowel sounds, soft.  Extremities without edema.  Neurologic exam he is alert he is oriented to name, place, month.   assessment/Plan: 1. 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  Multiple bilateral infarcts.  Most consistent with cardiac emboli.  Has history of atrial fibrillation.  He has significant right-sided weakness and dysarthria.  He also has some confusion. 2.  DVT prophylaxis with Eliquis. 3. Pain Management/polyneuropathy: Tylenol as needed 4. Mood/sleep/confusion: He is still on a fair number of medications that could  contribute to confusion.  I did discontinue some of them yesterday.  He continues on BuSpar 5 mg p.o. twice daily (lower dose than previously).  He continues on methylphenidate and Remeron.  I will continue his medications today.  He may need to decrease these even further. 5. Neuropsych: At this time he is not capable of making his own decisions. 6. Skin/Wound Care: Routine skin checks 7. Fluids/Electrolytes/Nutrition: Bmet normal on 6 4.  8.  Dysphasia.   Nectar thick liquids.  Tube feeds for nutritional support. 9.  History of atrial fibrillation.  Continue Eliquis.  On exam he appears to be in sinus rhythm.  Continue beta-blocker. 10.   Hyperlipidemia.  Zocor on lipitor PTA 11.  History of polyneuropathy.  Patient uses bilateral AFO braces prior to admission.  No up with her deficits however 12.  Hx HTN controlled. Vitals:   10/01/17 2053 10/02/17 0400  BP: 131/80 131/78  Pulse: 78 84  Resp: 16 20  Temp: 97.9 F (36.6 C) 98.7 F (37.1 C)  SpO2: 97% 93%   13.  Hx of GERD-now off famotidine.  He continues on Protonix. 14.  Nausea patient denies nausea. 15.  Constipation improving per pt 16.  Urinary retention has known BPH.  I had a long discussion with his son and the patient regarding urinary retention.  His son relates that when his bladder is full the patient becomes more irritable. 17.  Insomnia, sleep wake cycle disruption improving, continue Remeron, cont Ritalin breakfast and lunch 18.  Hiccups trial baclofen, resolved.  Baclofen discontinued. Daughter who is [phone] (Days) 13 A FACE TO FACE EVALUATION WAS PERFORMED  Bruce H Swords 10/02/2017, 8:29 AM

## 2017-10-03 ENCOUNTER — Inpatient Hospital Stay (HOSPITAL_COMMUNITY): Payer: Medicare Other | Admitting: Occupational Therapy

## 2017-10-03 ENCOUNTER — Inpatient Hospital Stay (HOSPITAL_COMMUNITY): Payer: Medicare Other

## 2017-10-03 ENCOUNTER — Inpatient Hospital Stay (HOSPITAL_COMMUNITY): Payer: Medicare Other | Admitting: Physical Therapy

## 2017-10-03 MED ORDER — METHYLPHENIDATE HCL 5 MG PO TABS
10.0000 mg | ORAL_TABLET | Freq: Two times a day (BID) | ORAL | Status: DC
  Administered 2017-10-04 – 2017-10-08 (×9): 10 mg via ORAL
  Filled 2017-10-03 (×10): qty 2

## 2017-10-03 MED ORDER — MIRTAZAPINE 30 MG PO TBDP
30.0000 mg | ORAL_TABLET | Freq: Every day | ORAL | Status: DC
  Administered 2017-10-03 – 2017-10-10 (×8): 30 mg via ORAL
  Filled 2017-10-03 (×8): qty 1

## 2017-10-03 MED ORDER — BETHANECHOL CHLORIDE 25 MG PO TABS
25.0000 mg | ORAL_TABLET | Freq: Three times a day (TID) | ORAL | Status: DC
  Administered 2017-10-03 – 2017-10-09 (×17): 25 mg via ORAL
  Filled 2017-10-03 (×17): qty 1

## 2017-10-03 NOTE — Progress Notes (Signed)
Subjective/Complaints:  "great day" per wife yesterday, slept poorly last noc and is very sluggish today.  Does open eyes to voice and is oriented x 3  ROS: Limited due to cognitive/behavioral , no pains no breathing problems , no nausea or vomiting,   Objective: Vital Signs: Blood pressure 136/77, pulse 83, temperature 98 F (36.7 C), temperature source Oral, resp. rate 20, height 6\' 2"  (1.88 m), weight 98.2 kg (216 lb 7.9 oz), SpO2 94 %. No results found. No results found for this or any previous visit (from the past 72 hour(s)).   Constitutional: No distress . Vital signs reviewed. HEENT: EOMI, oral membranes moist Neck: supple Cardiovascular: RRR without murmur. No JVD    Respiratory: CTA Bilaterally without wheezes or rales. Normal effort    GI: BS +, non-tender, non-distended  Skin:   Other IV sites and nares CDI Neuro:  Alert, follows simple commands.  Decreased insight and awareness., Abnormal Sensory Reduced bilateral below ankles, Abnormal Motor 3-/5 RUE grip, 2- RUE Bicepts and Triceps and 2- RLE hip/knee extensor synergy as well as plantarflexion.  Moderately dysarthric Musc/Skel:  Other mild pain with Right shoulder ext rotation, neg impingement sign Psych: Remains restless/anxious  Assessment/Plan: 1. 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 Physiatrist is providing close team supervision and 24 hour management of active medical problems listed below. Physiatrist and rehab team continue to assess barriers to discharge/monitor patient progress toward functional and medical goals. FIM: Function - Bathing Position: Bed Body parts bathed by helper: Right arm, Left arm, Chest, Abdomen, Front perineal area, Buttocks, Right upper leg, Left upper leg, Right lower leg, Left lower leg, Back Assist Level: 2 helpers  Function- Upper Body  Dressing/Undressing What is the patient wearing?: Pull over shirt/dress Pull over shirt/dress - Perfomed by patient: Pull shirt over trunk, Put head through opening Pull over shirt/dress - Perfomed by helper: Thread/unthread left sleeve, Thread/unthread right sleeve, Put head through opening Assist Level: Touching or steadying assistance(Pt > 75%) Function - Lower Body Dressing/Undressing What is the patient wearing?: Pants Position: Bed Pants- Performed by helper: Pull pants up/down Socks - Performed by helper: Don/doff right sock, Don/doff left sock Shoes - Performed by helper: Don/doff right shoe, Don/doff left shoe, Fasten right, Fasten left AFO - Performed by helper: Don/doff right AFO, Don/doff left AFO TED Hose - Performed by helper: Don/doff right TED hose, Don/doff left TED hose Assist for footwear: Maximal assist Assist for lower body dressing: 2 Helpers  Function - Toileting Toileting activity did not occur: Safety/medical concerns Toileting steps completed by helper: Adjust clothing prior to toileting, Performs perineal hygiene, Adjust clothing after toileting Toileting Assistive Devices: Other (comment)(Using [employer]) Assist level: Two helpers  Function - Air cabin crew transfer activity did not occur: Safety/medical concerns Toilet transfer assistive device: Facilities manager lift: Garment/textile technologist level to toilet: 2 helpers Assist level from toilet: 2 helpers  Function - Chair/bed transfer Chair/bed transfer [place]: Lateral scoot Chair/bed transfer assist level: 2 helpers Chair/bed transfer assistive device: Sliding board Chair/bed transfer details: Tactile cues for initiation, Tactile cues for sequencing, Tactile cues for weight shifting, Tactile cues for posture, Tactile cues for placement, Verbal cues for sequencing, Verbal cues for technique, Manual facilitation for placement, Manual facilitation for weight shifting  Function - Locomotion:  Wheelchair Will patient use wheelchair at discharge?: Yes Type: Manual Max wheelchair distance: 90 ft  Assist Level: Supervision  or verbal cues Assist Level: Supervision or verbal cues Assist Level: Dependent (Pt equals 0%) Turns around,maneuvers to table,bed, and toilet,negotiates 3% grade,maneuvers on rugs and over doorsills: No Function - Locomotion: Ambulation Ambulation activity did not occur: Safety/medical concerns Assistive device: Rail in hallway Max distance: 2 Assist level: 2 helpers Walk 10 feet activity did not occur: Safety/medical concerns Walk 50 feet with 2 turns activity did not occur: Safety/medical concerns Walk 150 feet activity did not occur: Safety/medical concerns Walk 10 feet on uneven surfaces activity did not occur: Safety/medical concerns  Function - Comprehension Comprehension: Auditory Comprehension assistive device: Hearing aids Comprehension assist level: Understands basic 90% of the time/cues < 10% of the time  Function - Expression Expression: Verbal Expression assist level: Expresses basic 50 - 74% of the time/requires cueing 25 - 49% of the time. Needs to repeat parts of sentences., Expresses basic 75 - 89% of the time/requires cueing 10 - 24% of the time. Needs helper to occlude trach/needs to repeat words.  Function - Social Interaction Social Interaction assist level: Interacts appropriately 90% of the time - Needs monitoring or encouragement for participation or interaction.  Function - Problem Solving Problem solving assist level: Solves basic 50 - 74% of the time/requires cueing 25 - 49% of the time, Solves basic 75 - 89% of the time/requires cueing 10 - 24% of the time  Function - Memory Memory assistive device: Memory book Memory assist level: Recognizes or recalls 50 - 74% of the time/requires cueing 25 - 49% of the time Patient normally able to recall (first 3 days only): None of the above  Medical Problem List and Plan: 1.  Right  side weakness with dysarthria secondary to multiple bilateral infarcts (left BG, tiny Right frontal and bilateral  occipital infarcts) consistent with cardiac emboli with history of atrial fibrillation.Onset 5/29 Motor strength improving in RUE,discussed progress with family  Continue CIR PT OT speech, 2.  DVT Prophylaxis/Anticoagulation: Eliquis 3. Pain Management/polyneuropathy: Tylenol as needed 4. Mood/sleep/confusion: BuSpar weaned to 5 mg twice daily, does not appear to be anxious at this time will d/c    With complaints  insomnia as well as decreased appetite would increase Remeron  To 30mg   sleep graph 5. Neuropsych: This patient is capable of making decisions on his own behalf. 6. Skin/Wound Care: Routine skin checks 7. Fluids/Electrolytes/Nutrition: BMET normal 6/4  -Appreciate dietary consult  8.  Dysphasia.  Dysphasia #2 nectar liquids.Cortrak tube feeds for nutritional support.  Check calorie counts.    Repeat modified this week as per speech therapy    -trial Megace ongoing with sluggish results so far. With AMS above, will go ahead and hold for now as well   -H20 boluses 9.  History of atrial fibrillation.  Continue Eliquis 5mg  BID.  Cardiac rate controlled, on metoprolol PTA 10.  Hyperlipidemia.  Zocor on lipitor PTA 11.  History of polyneuropathy.  Patient uses bilateral AFO braces prior to admission.  No up with her deficits however 12.  Hx HTN lasix and Lisinopril PTA Vitals:   10/03/17 1120 10/03/17 1352  BP: 132/82 136/77  Pulse: 85 83  Resp:  20  Temp:  98 F (36.7 C)  SpO2: 99% 94%  BPs controlled 6/15 13.  Hx of GERD-  Pepcid to 40mg  BID, added protonix 40mg  daily, improved 14.  Nausea without vomiting, question medication versus tube feeding versus constipation.  Appears to have improved partially 15.  Constipation improving per pt 16.  Urinary retention  likely multifactorial  immobility and prob BPH,   flomax increased to home dose 0.8mg , added urecholine  will titrate up to 25mg  TID  -Urine culture positive enterococcus  On Amoxil 17.  sleep wake cycle disruption  increase Remeron, increase Ritalin breakfast and lunch 18.  Hiccups inproved off meds Daughter who is [phone] (Days) [place] E Kirsteins 10/03/2017, 5:40 PM

## 2017-10-03 NOTE — Progress Notes (Signed)
Physical Therapy Weekly Progress Note  Patient Details  Name: Walter Bryant MRN: [id number] Date of Birth: [birth year]  Beginning of progress report period: September 26, 2017 End of progress report period: October 03, 2017  Today's Date: 10/03/2017 PT Individual Time: 1109-1205 PT Individual Time Calculation (min): 56 min   Patient has met 0 of 3 short term goals.  Pt has been experiencing a functional decline over past week or so. Pt currently requires +2 assist with mechanical lift transfers and has very poor activity tolerance. Current focus of treatment is pt/family education, d/c planning, and maintaining & increasing pt's activity tolerance as able. LCSW & pt's wife aware of pt's decline with recommendation for potential palliative consult to determine pt/family goals.  Patient continues to demonstrate the following deficits muscle weakness, decreased cardiorespiratoy endurance, decreased coordination, decreased initiation, decreased attention, decreased awareness, decreased problem solving, decreased safety awareness, decreased memory and delayed processing, and decreased sitting balance, decreased standing balance, decreased postural control, hemiplegia and decreased balance strategies and therefore will continue to benefit from skilled PT intervention to increase functional independence with mobility.  Patient not progressing toward long term goals.  See goal revision..  Plan of care revisions: goals have been downgraded to max/total assist for bed mobility & bed<>w/c transfers, standing tolerance x 15 minutes on tilt table for strengthening & NMR, min assist for sitting balance.  PT Short Term Goals Week 2:  PT Short Term Goal 1 (Week 2): Pt will ambulate 10 ft with max assist with therapy only, for strengthening & NMR. PT Short Term Goal 1 - Progress (Week 2): Not met PT Short Term Goal 2 (Week 2): Pt will tolerate standing x 10 minutes for weight bearing and NMR. PT Short Term Goal 2 -  Progress (Week 2): Not met PT Short Term Goal 3 (Week 2): Pt will propel w/c 125 ft with supervision. PT Short Term Goal 3 - Progress (Week 2): Not met Week 3:  PT Short Term Goal 1 (Week 3): STG = LTG due to estimated d/c date.   Skilled Therapeutic Interventions/Progress Updates:  Pt received in TIS w/c & agreeable to tx. No c/o pain reported. Transported pt to/from dayroom via w/c total assist for time management. Pt transferred on/off tilt table with maxisky +2 assist for safety. Vitals assessed: BP = 132/82 mmHg, HR = 85 bpm, SpO2 = 99% on room air. Pt tolerated upright at 50 degrees, increasing to 68 degrees for a total of 8 min + 8 min. Pt engaged in reaching for objects and tossing them to activate trunk muscles. Pt required return to supine to rest periodically during session. Pt's wife present for last half of session and educated on pt's functional decline and anticipated need for +2 assist upon d/c. Pt's wife unable to verbalize goals for pt and LCSW made aware of situation - suggested potential palliative consult to determine pt/family goals. At end of session pt left in TIS w/c in room with seat belt donned, wife present supervising lunch.   Therapy Documentation Precautions:  Precautions Precautions: Fall, Other (comment) Precaution Comments: R hemi, lateral lean and pushes to the R; Cortrak Required Braces or Orthoses: Other Brace/Splint Other Brace/Splint: bilateral upright AFO's (used PTA. Has h/o charcot foot on L) Restrictions Weight Bearing Restrictions: No   See Function Navigator for Current Functional Status.  Therapy/Group: Individual Therapy  [name] 10/03/2017, 12:27 PM

## 2017-10-03 NOTE — Progress Notes (Signed)
Speech Language Pathology Daily Session Note  Patient Details  Name: Walter Bryant MRN: [id number] Date of Birth: [birth year]  Today's Date: 10/03/2017 SLP Individual Time: 1517-6160 SLP Individual Time Calculation (min): 45 min  Short Term Goals: Week 2: SLP Short Term Goal 1 (Week 2): Pt will particpate in instrumental swallow assessment to determine least restrictive diet. SLP Short Term Goal 2 (Week 2): Pt will consume dys 3 trials with efficient mastication and oral clearnace without overt s/s aspiration to demonstrate readiness of solid upgrade. SLP Short Term Goal 3 (Week 2): Pt will recall new, daily information with Min A verbal cues for external aids. SLP Short Term Goal 4 (Week 2): Pt will demonstrate 70% intelligibility at phrase level with Mod A verbal cues for use of speech intelligibility strategies.  SLP Short Term Goal 4 - Progress (Week 2): Revised due to lack of progress SLP Short Term Goal 5 (Week 2): Pt will demonstrate sustained attention to functional tasks for 10 minutes with Mod A verbal cues for redirection. SLP Short Term Goal 6 (Week 2): Pt will demonstrate functional problem solving in basic and familiar tasks with Mod A verbal cues. SLP Short Term Goal 6 - Progress (Week 2): Revised due to lack of progress  Skilled Therapeutic Interventions:Skilled ST services focused on swallow and cognitive skills. SLP facilitated PO consumption of thin liquids, pt's wife completed oral care 10 minutes piror to session per wife/pt, pt demonstrated 1 throat clear with cup sips and throughout session min throat clear on own secretions followed by clear vocal quality. Pt demonstrated increase in meantation and vocal intensity, however continues with reduce breath support in conversation and reduce endurence. SLP recommends MBS this week due to improved cognition and positive results on water protocol.SLP facilitated IMST device exercises set at level 13 cm h20, pt demonstrated return  demonstration of 5 repietation and required mod A verbal cues to produce adequete strength. Pt and wife agreed to continue to IMST exercises outside of therapy sessions and clarified confusion of how many exercises should be completed daily, referring to posted visual aid. SLP facilitated mildly complex problem solving skills in card sequencing tasks, pt required mod-min A verbal cues for 4 step sequence and max-mod A verbal cues for 5 step sequence. Pt was left in room with call bell within reach. Reccomend to continue skilled ST services.   Noted, nursing completed thickening education with pt's wife, pt's wife returned demonstration. SLP dicussed comfort level of thickening liquid, such as ensure, pt's wife stated she felt confident and SLP answered all question. SLP signed off pt's wife to thicken liquids to NTL.     Function:  Eating Eating   Modified Consistency Diet: No(thin trials ) Eating Assist Level: Set up assist for;Supervision or verbal cues           Cognition Comprehension Comprehension assist level: Understands basic 90% of the time/cues < 10% of the time  Expression   Expression assist level: Expresses basic 50 - 74% of the time/requires cueing 25 - 49% of the time. Needs to repeat parts of sentences.;Expresses basic 75 - 89% of the time/requires cueing 10 - 24% of the time. Needs helper to occlude trach/needs to repeat words.  Social Interaction Social Interaction assist level: Interacts appropriately 90% of the time - Needs monitoring or encouragement for participation or interaction.  Problem Solving Problem solving assist level: Solves basic 50 - 74% of the time/requires cueing 25 - 49% of the time;Solves basic 75 - 89%  of the time/requires cueing 10 - 24% of the time  Memory Memory assist level: Recognizes or recalls 50 - 74% of the time/requires cueing 25 - 49% of the time    Pain Pain Assessment Pain Scale: 0-10 Pain Score: 0-No pain  Therapy/Group: Individual  Therapy  Dixie Coppa  [facility] 10/03/2017, 12:53 PM

## 2017-10-03 NOTE — Progress Notes (Signed)
Occupational Therapy Session Note  Patient Details  Name: Walter Bryant MRN: [id number] Date of Birth: 02-06-1931  Today's Date: 10/03/2017 OT Individual Time: 1655-3748 OT Individual Time Calculation (min): 45 min    Short Term Goals: Week 2:  OT Short Term Goal 1 (Week 2): Pt will consistently complete sliding board transfers with max A +1 in order to reduce caregiver burden.  OT Short Term Goal 2 (Week 2): Pt will don shirt with mod A seated EOB. OT Short Term Goal 3 (Week 2): Pt will tolerate 5 minutes of therapeutic activity without need for rest break in order to increase functional activity tolerance  Skilled Therapeutic Interventions/Progress Updates:    Pt seen for OT ADL session. Pt in supine upon arrival with wife present. Pt agreeable to tx session, and desiring to get OOB. Pants donned total A in supine, pt rolling with mod A in order for pants to be pulled up. Pt transferred to sitting EOB with multimodal cuing for sequencing/technique with positioning facilitate core strengthening/ engagement.  Pt sat EOB with B AFOs/shoes donned total A. Pillow placed under R hip to facilitate midline sitting due to R bias in sitting, multimodal cuing/targets provided as well to facilitate midline and erect sitting. Once positioned, pt then able to maintain static sitting with CGA. Shirt donned sitting EOB, pt able to recall hemi-dressing technique independently. Pt becoming SOB following extended unsupported sitting position and led through deep breathing techniques in order to normalize breathing rate again. He completed +2 total A sliding board transfer to w/c, difficulty maintaining head/hip relationship to assist with transfer. He completed grooming tasks from w/c level at sink. Max A to use R UE at stabilizer level and placed in [place] position on sink ledge when not in use. Pt able to complete oral care and shave with use of electric razor. Pt left seated in w/c at sink to complete grooming  tasks at end of session with wife present to assist.    Therapy Documentation Precautions:  Precautions Precautions: Fall, Other (comment) Precaution Comments: R hemi, lateral lean and pushes to the R; Cortrak Required Braces or Orthoses: Other Brace/Splint Other Brace/Splint: bilateral upright AFO's (used PTA. 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   Therapy/Group: Individual Therapy  Ysabella Babiarz L 10/03/2017, 7:11 AM

## 2017-10-04 ENCOUNTER — Inpatient Hospital Stay (HOSPITAL_COMMUNITY): Payer: Medicare Other | Admitting: Occupational Therapy

## 2017-10-04 ENCOUNTER — Ambulatory Visit (HOSPITAL_COMMUNITY): Payer: Medicare Other

## 2017-10-04 ENCOUNTER — Inpatient Hospital Stay (HOSPITAL_COMMUNITY): Payer: Medicare Other | Admitting: Physical Therapy

## 2017-10-04 MED ORDER — JEVITY 1.5 CAL/FIBER PO LIQD
1000.0000 mL | ORAL | Status: DC
  Administered 2017-10-04: 1000 mL
  Filled 2017-10-04 (×5): qty 1000

## 2017-10-04 MED ORDER — JEVITY 1.5 CAL/FIBER PO LIQD
1000.0000 mL | ORAL | Status: DC
  Filled 2017-10-04 (×3): qty 1000

## 2017-10-04 NOTE — Progress Notes (Signed)
Occupational Therapy Weekly Progress Note  Patient Details  Name: Walter Bryant MRN: [id number] Date of Birth: 1930-08-26  Beginning of progress report period: September 27, 2017 End of progress report period: October 04, 2017  Today's Date: 10/04/2017 OT Individual Time: 1005-1100 OT Individual Time Calculation (min): 55 min    Patient has met 1 of 3 short term goals.  Pt cont to make very limited progress in therapy. He cont to be most limited by extreme fatigue, tolerating only 1-2 minutes of functional task before requiring rest break and unable to tolerate >1 minute of unsupported sitting before requiring max- total A for sitting balance. Pt currently requires +2 total A for sliding board transfers. During therapy sessions, pt will use SARA plus for transfer to [facility] and total A toileting, recommending bed pan and bed level toileting with nursing due to the amount of skilled assist pt requires to make transfer and task successful.  Pt's wife has been present throughout rehab admission and is aware of his continual functional decline.   Patient continues to demonstrate the following deficits: hemiplegia affecting dominant side and muscle weakness (generalized) and therefore will continue to benefit from skilled OT intervention to enhance overall performance with BADL and Reduce care partner burden.  Patient not progressing toward long term goals.  See goal revision..  Plan of care revisions: Goals downgraded to max A overall due to slow pt progress.  OT Short Term Goals Week 2:  OT Short Term Goal 1 (Week 2): Pt will consistently complete sliding board transfers with max A +1 in order to reduce caregiver burden.  OT Short Term Goal 1 - Progress (Week 2): Not met OT Short Term Goal 2 (Week 2): Pt will don shirt with mod A seated EOB. OT Short Term Goal 2 - Progress (Week 2): Met OT Short Term Goal 3 (Week 2): Pt will tolerate 5 minutes of therapeutic activity without need for rest break in order  to increase functional activity tolerance OT Short Term Goal 3 - Progress (Week 2): Not met Week 3:  OT Short Term Goal 1 (Week 3): STG=LTG due to LOS  Skilled Therapeutic Interventions/Progress Updates:    Pt seen for OT session focusing on functional mobility, UE ROM and education. Pt sitting upright in bed, LEs off EOB. Pt stating he was attempting to come sitting EOB, however, required assist to come remainder of way to EOB. Pt's son present during pt's attempt. Reviewed need for assist with all mobility. Shoes donned from position pt was in and he transferred remainder of way to EOB with max A using chuck pad to assist with shifting of hips. Pt required +2 total A for sliding board transfer to w/c. He completed grooming tasks from w/c level at sink, max A to use R UE at stabilizer level and placed in [place] position on sink ledge during grooming tasks. Suction used to assist with oral care 2/2 pt being on thickened liquids. Extensive discussion with pt (and pt's son present during conversation, however, did not participate in conversation) regarding pt's goals both in therapy and at d/c. Today, pt motivated to cont with therapy, however, functional gains not being made on rehab. Pt with decreased insight into deficits and rate of return. Pt taken to therapy day room in w/c total A. Completed shoulder protraction/retraction movements at table top level with use of arm skate. Pt able to maintain static sitting balance unsupported on w/c with multimodal cuing to facilitate anterior weight shift.  Pt  demonstrates improved L UE ROM, full bicep/tricep movements against gravity and emerging gross grasping abilities. Pt left in day room at end of session with hand off to SLP.   Pt much more alert this session compared to previous sessions, however, cont to require cuing for clear, loud speech 2/2 dysarthria.   Therapy Documentation Precautions:  Precautions Precautions: Fall, Other (comment) Precaution  Comments: R hemi, lateral lean and pushes to the R; Cortrak Required Braces or Orthoses: Other Brace/Splint Other Brace/Splint: bilateral upright AFO's (used PTA. 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   Therapy/Group: Individual Therapy  Kenan Moodie L 10/04/2017, 6:57 AM

## 2017-10-04 NOTE — Progress Notes (Addendum)
Physical Therapy Session Note  Patient Details  Name: Walter Bryant MRN: [id number] Date of Birth: December 14, 1930  Today's Date: 10/04/2017 PT Individual Time: 1505-1600 PT Individual Time Calculation (min): 55 min   Short Term Goals: Week 3:  PT Short Term Goal 1 (Week 3): STG = LTG due to estimated d/c date.   Skilled Therapeutic Interventions/Progress Updates:  Pt received in bed with wife Romie Minus) & son Wille Glaser) present for session. Pt transferred supine>sitting EOB with total assist, pt unable to assist with pulling on bed rails 2/2 hx of LUE shoulder arthritis & limited ROM. Pt required max>min assist for static sitting balance EOB. Therapist donned B shoes & AFOs total assist for time management. Pt completes bed>w/c transfer via slide board with +2 assist with therapist providing max multimodal cuing for head/hips relationship & to push with LUE but pt with very little to no ability to assist with transfer. Transported pt to dayroom via w/c total assist and pt transferred to standing frame where he tolerated standing 6 minutes +  5 minutes with occasional use of mirror for visual feedback for upright posture. Pt engaged in reaching anteriorly and returning to midline focusing on midline orientation; pt continues to demonstrate R lateral lean. Pt required seated rest breaks between each trial 2/2 fatigue. Continued to educate pt's wife on benefits of palliatve consult, if this is an option, to determine pt & family's goals of care. Educated her on pt's need for extensive +2 assist for duration, as pt is actually declining functionally, and while pt is more alert today, he continues to require +2 total assist for all tasks. Educated her that it is not anticipated that pt will return to ILF, nor ALF, and will need skilled care for long duration of time following d/c. Romie Minus was very appreciative of all information. At end of session pt left sitting in TIS w/c with seat belt donned & family in room to  supervise.   Therapy Documentation Precautions:  Precautions Precautions: Fall, Other (comment) Precaution Comments: R hemi, lateral lean and pushes to the R; Cortrak Required Braces or Orthoses: Other Brace/Splint Other Brace/Splint: bilateral upright AFO's (used PTA. Has h/o charcot foot on L) Restrictions Weight Bearing Restrictions: No General:    See Function Navigator for Current Functional Status.   Therapy/Group: Individual Therapy  [name] 10/04/2017, 4:13 PM
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
## 2017-10-04 NOTE — Progress Notes (Addendum)
Subjective/Complaints:  Slept 8 hours last noc feels more laert today  ROS: Limited due to cognitive/behavioral , no pains no breathing problems , no nausea or vomiting,   Objective: Vital Signs: Blood pressure (!) 126/93, pulse 73, temperature 98 F (36.7 C), temperature source Oral, resp. rate 16, height 6\' 2"  (1.88 m), weight 98.2 kg (216 lb 7.9 oz), SpO2 99 %. No results found. No results found for this or any previous visit (from the past 72 hour(s)).   Constitutional: No distress . Vital signs reviewed. HEENT: EOMI, oral membranes moist Neck: supple Cardiovascular: RRR without murmur. No JVD    Respiratory: CTA Bilaterally without wheezes or rales. Normal effort    GI: BS +, non-tender, non-distended  Skin:   Other IV sites and nares CDI Neuro:  Alert, follows simple commands.  Decreased insight and awareness., Abnormal Sensory Reduced bilateral below ankles, Abnormal Motor 3-/5 RUE grip, 2- RUE Bicepts and Triceps and 2- RLE hip/knee extensor synergy as well as plantarflexion.  Moderately dysarthric Musc/Skel:  Other mild pain with Right shoulder ext rotation, neg impingement sign Psych: Remains restless/anxious  Assessment/Plan: 1. 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 Physiatrist is providing close team supervision and 24 hour management of active medical problems listed below. Physiatrist and rehab team continue to assess barriers to discharge/monitor patient progress toward functional and medical goals. FIM: Function - Bathing Position: Bed Body parts bathed by helper: Right arm, Left arm, Chest, Abdomen, Front perineal area, Buttocks, Right upper leg, Left upper leg, Right lower leg, Left lower leg, Back Assist Level: 2 helpers  Function- Upper Body Dressing/Undressing What is the patient wearing?: Pull over shirt/dress Pull over  shirt/dress - Perfomed by patient: Pull shirt over trunk, Put head through opening Pull over shirt/dress - Perfomed by helper: Thread/unthread left sleeve, Thread/unthread right sleeve, Put head through opening Assist Level: Touching or steadying assistance(Pt > 75%) Function - Lower Body Dressing/Undressing What is the patient wearing?: Pants Position: Bed Pants- Performed by helper: Pull pants up/down Socks - Performed by helper: Don/doff right sock, Don/doff left sock Shoes - Performed by helper: Don/doff right shoe, Don/doff left shoe, Fasten right, Fasten left AFO - Performed by helper: Don/doff right AFO, Don/doff left AFO TED Hose - Performed by helper: Don/doff right TED hose, Don/doff left TED hose Assist for footwear: Maximal assist Assist for lower body dressing: 2 Helpers  Function - Toileting Toileting activity did not occur: Safety/medical concerns Toileting steps completed by helper: Adjust clothing prior to toileting, Performs perineal hygiene, Adjust clothing after toileting Toileting Assistive Devices: Other (comment)(Using [employer]) Assist level: Two helpers  Function - Air cabin crew transfer activity did not occur: Safety/medical concerns Toilet transfer assistive device: Facilities manager lift: Garment/textile technologist level to toilet: 2 helpers Assist level from toilet: 2 helpers  Function - Chair/bed transfer Chair/bed transfer [place]: Lateral scoot Chair/bed transfer assist level: 2 helpers Chair/bed transfer assistive device: Sliding board Chair/bed transfer details: Tactile cues for initiation, Tactile cues for sequencing, Tactile cues for weight shifting, Tactile cues for posture, Tactile cues for placement, Verbal cues for sequencing, Verbal cues for technique, Manual facilitation for placement, Manual facilitation for weight shifting  Function - Locomotion: Wheelchair Will patient use wheelchair at discharge?: Yes Type: Manual Max wheelchair  distance: 90 ft  Assist Level: Supervision or verbal cues Assist Level: Supervision or verbal cues Assist Level: Dependent (Pt equals 0%)  Turns around,maneuvers to table,bed, and toilet,negotiates 3% grade,maneuvers on rugs and over doorsills: No Function - Locomotion: Ambulation Ambulation activity did not occur: Safety/medical concerns Assistive device: Rail in hallway Max distance: 2 Assist level: 2 helpers Walk 10 feet activity did not occur: Safety/medical concerns Walk 50 feet with 2 turns activity did not occur: Safety/medical concerns Walk 150 feet activity did not occur: Safety/medical concerns Walk 10 feet on uneven surfaces activity did not occur: Safety/medical concerns  Function - Comprehension Comprehension: Auditory Comprehension assistive device: Hearing aids Comprehension assist level: Understands basic 90% of the time/cues < 10% of the time  Function - Expression Expression: Verbal Expression assist level: Expresses basic 50 - 74% of the time/requires cueing 25 - 49% of the time. Needs to repeat parts of sentences., Expresses basic 75 - 89% of the time/requires cueing 10 - 24% of the time. Needs helper to occlude trach/needs to repeat words.  Function - Social Interaction Social Interaction assist level: Interacts appropriately 90% of the time - Needs monitoring or encouragement for participation or interaction.  Function - Problem Solving Problem solving assist level: Solves basic 50 - 74% of the time/requires cueing 25 - 49% of the time, Solves basic 75 - 89% of the time/requires cueing 10 - 24% of the time  Function - Memory Memory assistive device: Memory book Memory assist level: Recognizes or recalls 50 - 74% of the time/requires cueing 25 - 49% of the time Patient normally able to recall (first 3 days only): None of the above  Medical Problem List and Plan: 1.  Right side weakness with dysarthria secondary to multiple bilateral infarcts (left BG, tiny  Right frontal and bilateral  occipital infarcts) consistent with cardiac emboli with history of atrial fibrillation.Onset 5/29 Team conf in am   Motor strength improving in RUE,discussed progress with family  Continue CIR PT OT speech, 2.  DVT Prophylaxis/Anticoagulation: Eliquis 3. Pain Management/polyneuropathy: Tylenol as needed 4. Mood/sleep/confusion: BuSpar weaned to 5 mg twice daily, does not appear to be anxious at this time will d/c    With complaints  insomnia as well as decreased appetite would increase Remeron  To 30mg   sleep graph 5. Neuropsych: This patient is capable of making decisions on his own behalf. 6. Skin/Wound Care: Routine skin checks 7. Fluids/Electrolytes/Nutrition: BMET normal 6/4  -Appreciate dietary consult  8.  Dysphasia.  Dysphasia #2 nectar liquids.Cortrak tube feeds for nutritional support.  Check calorie counts.    Repeat modified this week as per speech therapy    -trial Megace ongoing with sluggish results so far. With AMS above, will go ahead and hold for now as well   -H20 boluses 9.  History of atrial fibrillation.  Continue Eliquis 5mg  BID.  Cardiac rate controlled, on metoprolol PTA 10.  Hyperlipidemia.  Zocor on lipitor PTA 11.  History of polyneuropathy.  Patient uses bilateral AFO braces prior to admission.  No up with her deficits however 12.  Hx HTN lasix and Lisinopril PTA Vitals:   10/03/17 1954 10/04/17 0406  BP: 126/71 (!) 126/93  Pulse: 81 73  Resp: 16 16  Temp: 97.8 F (36.6 C) 98 F (36.7 C)  SpO2: 96% 99%  BPs controlled 6/15 13.  Hx of GERD-  Pepcid to 40mg  BID, added protonix 40mg  daily, improved 14.  Nausea without vomiting, question medication versus tube feeding versus constipation.  Appears to have improved partially 15.  Constipation improving per pt 16.  Urinary retention  likely multifactorial immobility and prob BPH,  flomax increased to home dose 0.8mg , added urecholine will titrate up to 25mg  TID  -Urine culture  positive enterococcus  On Amoxil 17.  sleep wake cycle disruption, improved sleep last noc   increased Remeron, increase Ritalin breakfast and lunch 18.  Hiccups inproved off meds Daughter who is [phone] (Days) [place] Lorenza Winkleman 10/04/2017, 8:38 AM

## 2017-10-04 NOTE — Plan of Care (Signed)
  Problem: Consults Goal: RH STROKE PATIENT EDUCATION Description See Patient Education module for education specifics  Outcome: Progressing Goal: Nutrition Consult-if indicated Outcome: Progressing   Problem: RH BOWEL ELIMINATION Goal: RH STG MANAGE BOWEL WITH ASSISTANCE Description STG Manage Bowel with moderate Assistance.  Outcome: Progressing Goal: RH STG MANAGE BOWEL W/MEDICATION W/ASSISTANCE Description STG Manage Bowel with Medication with min Assistance.  Outcome: Progressing   Problem: RH BLADDER ELIMINATION Goal: RH STG MANAGE BLADDER WITH ASSISTANCE Description STG Manage Bladder With moderate Assistance  Outcome: Progressing Goal: RH STG MANAGE BLADDER WITH EQUIPMENT WITH ASSISTANCE Description STG Manage Bladder With Equipment and With total Assistance  Outcome: Progressing   Problem: RH SKIN INTEGRITY Goal: RH STG SKIN FREE OF INFECTION/BREAKDOWN Description Skin to remain free from breakdown with min assistance while controlling incontinence by toileting patient every 4 hours or more frequently as needed by patient.  Outcome: Progressing Goal: RH STG MAINTAIN SKIN INTEGRITY WITH ASSISTANCE Description STG Maintain Skin Integrity With moderate Assistance.  Outcome: Progressing Goal: RH STG ABLE TO PERFORM INCISION/WOUND CARE W/ASSISTANCE Description STG Able To Perform skin Care With moderate Assistance.  Outcome: Progressing   Problem: RH SAFETY Goal: RH STG ADHERE TO SAFETY PRECAUTIONS W/ASSISTANCE/DEVICE Description STG Adhere to Safety Precautions With moderate Assistance and appropriate assistive Devices.  Outcome: Progressing Goal: RH STG DECREASED RISK OF FALL WITH ASSISTANCE Description STG Decreased Risk of Fall With moderate Assistance while following CIR's new standard toileting protocol of every 4 hours.  Outcome: Progressing   Problem: RH PAIN MANAGEMENT Goal: RH STG PAIN MANAGED AT OR BELOW PT'S PAIN GOAL Description < 3 on a 0-10  pain scale  Outcome: Progressing   Problem: RH KNOWLEDGE DEFICIT Goal: RH STG INCREASE KNOWLEDGE OF HYPERTENSION Description Patient and spouse will be able to demonstrate knowledge of new HTN medications, including dosage amounts, dosages per day, and follow-up care with the MD post discharge with min assist from rehab staff.  Outcome: Progressing Goal: RH STG INCREASE KNOWLEDGE OF DYSPHAGIA/FLUID INTAKE Description Patient and spouse will be able to demonstrate knowledge of safe swallowing techniques, and proper techniques for thickening liquids with min assist from rehab staff.  Outcome: Progressing

## 2017-10-04 NOTE — Progress Notes (Signed)
Speech Language Pathology Weekly Progress and Session Note  Patient Details  Name: Walter Bryant MRN: [id number] Date of Birth: Nov 18, 1930  Beginning of progress report period: September 27, 2017 End of progress report period: October 04, 2017  Today's Date: 10/04/2017 SLP Individual Time: 1100-1200 SLP Individual Time Calculation (min): 60 min  Short Term Goals: Week 2: SLP Short Term Goal 1 (Week 2): Pt will particpate in instrumental swallow assessment to determine least restrictive diet. SLP Short Term Goal 1 - Progress (Week 2): Not met(scheduled for 6/19) SLP Short Term Goal 2 (Week 2): Pt will consume dys 3 trials with efficient mastication and oral clearnace without overt s/s aspiration to demonstrate readiness of solid upgrade. SLP Short Term Goal 2 - Progress (Week 2): Not met SLP Short Term Goal 3 (Week 2): Pt will recall new, daily information with Min A verbal cues for external aids. SLP Short Term Goal 3 - Progress (Week 2): Not met SLP Short Term Goal 4 (Week 2): Pt will demonstrate 70% intelligibility at phrase level with Mod A verbal cues for use of speech intelligibility strategies.  SLP Short Term Goal 4 - Progress (Week 2): Met SLP Short Term Goal 5 (Week 2): Pt will demonstrate sustained attention to functional tasks for 10 minutes with Mod A verbal cues for redirection. SLP Short Term Goal 5 - Progress (Week 2): Met SLP Short Term Goal 6 (Week 2): Pt will demonstrate functional problem solving in basic and familiar tasks with Mod A verbal cues. SLP Short Term Goal 6 - Progress (Week 2): Met    New Short Term Goals: Week 3: SLP Short Term Goal 1 (Week 3): Pt will particpate in instrumental swallow assessment to determine least restrictive diet. SLP Short Term Goal 2 (Week 3): Pt will recall new, daily information with Min A verbal cues for external aids. SLP Short Term Goal 3 (Week 3): Pt will demonstrate 70% intelligibility at sentence level with Mod A verbal cues for use  of speech intelligibility strategies.  SLP Short Term Goal 4 (Week 3): Pt will demonstrate sustained attention to functional tasks for 45 minutes with Min A verbal cues for redirection. SLP Short Term Goal 5 (Week 3): Pt will demonstrate functional problem solving in basic and familiar tasks with Min A verbal cues.  Weekly Progress Updates:  Pt made moderate progress meeting 3 out 6 goals, impediment in progress contributed by medical condition of UTI and overall fatigue, however demonstrated increase progress at end of reporting period. Pt is scheduled for MBS tomorrow,10/05/2017 supported by continuous progress on water protocol in most recent sessions.Pt requires max A verbal cues for speech intelligibility at sentence level due to reduce breath support. SLP will continue use of IMST device to increase breath support along with speech intelligibility strategies, focus on sustained attention, basic problem solving skills and family education. Pt would continue to benefit from skilled ST services in order to maximize functional independence and reduce burden of care prior to discharge at SNF with continued skilled ST services.     Intensity: Minumum of 1-2 x/day, 30 to 90 minutes Frequency: 3 to 5 out of 7 days Duration/Length of Stay: 6/25 _ awaiting SNF placement Treatment/Interventions: Cognitive remediation/compensation;Cueing hierarchy;Dysphagia/aspiration precaution training;Functional tasks;Patient/family education   Daily Session  Skilled Therapeutic Interventions: Skilled ST services focused on swallow and cognitive skills. SLP facilitated PO consumption of thin liquid via cup 60 oz with 1 noted delayed throat cleared followed by clear vocal quality. SLP ordered MBS 10/05/2017 to  further assess swallow function and least restrictive diet. SLP facilitated semi-complex problem solving and recall skills utilizing ALFA money management task pt required mod A verbal cues, however pt begin to  fatigue after 40 minutes and given basic problem solving task of identifying three difference among two cards, at the end of the session, required max A verbal cues. SLP facilitated return demonstration of IMST device set at 13 cm h20, pt completed 4 sets of 5 with brakes and required mod-min A verbal cues for accuracy, with self-perceived effort score of 7. Pt was left in room with call bell within reach and family in room. Reccomend to continue skilled ST services.       Function:   Eating Eating   Modified Consistency Diet: Yes(thin and dys 3 trials with 45 minutes in between) Eating Assist Level: Set up assist for;Supervision or verbal cues   Eating Set Up Assist For: Opening containers       Cognition Comprehension Comprehension assist level: Understands basic 90% of the time/cues < 10% of the time  Expression   Expression assist level: Expresses basic 50 - 74% of the time/requires cueing 25 - 49% of the time. Needs to repeat parts of sentences.;Expresses basic 75 - 89% of the time/requires cueing 10 - 24% of the time. Needs helper to occlude trach/needs to repeat words.  Social Interaction Social Interaction assist level: Interacts appropriately 90% of the time - Needs monitoring or encouragement for participation or interaction.  Problem Solving Problem solving assist level: Solves basic 50 - 74% of the time/requires cueing 25 - 49% of the time;Solves basic 25 - 49% of the time - needs direction more than half the time to initiate, plan or complete simple activities  Memory Memory assist level: Recognizes or recalls 50 - 74% of the time/requires cueing 25 - 49% of the time;Recognizes or recalls 75 - 89% of the time/requires cueing 10 - 24% of the time   General    Pain    Therapy/Group: Individual Therapy  Kenlee Maler  [facility] 10/04/2017, 12:13 PM

## 2017-10-05 ENCOUNTER — Inpatient Hospital Stay (HOSPITAL_COMMUNITY): Payer: Medicare Other | Admitting: Physical Therapy

## 2017-10-05 ENCOUNTER — Encounter (HOSPITAL_COMMUNITY): Payer: Medicare Other | Admitting: Speech Pathology

## 2017-10-05 ENCOUNTER — Inpatient Hospital Stay (HOSPITAL_COMMUNITY): Payer: Medicare Other | Admitting: Occupational Therapy

## 2017-10-05 ENCOUNTER — Inpatient Hospital Stay (HOSPITAL_COMMUNITY): Payer: Medicare Other | Admitting: Speech Pathology

## 2017-10-05 ENCOUNTER — Inpatient Hospital Stay (HOSPITAL_COMMUNITY): Payer: Medicare Other

## 2017-10-05 MED ORDER — JEVITY 1.5 CAL/FIBER PO LIQD
1000.0000 mL | ORAL | Status: DC
  Filled 2017-10-05 (×2): qty 1000

## 2017-10-05 NOTE — Progress Notes (Signed)
Speech Language Pathology Daily Session Note  Patient Details  Name: Walter Bryant MRN: [id number] Date of Birth: 06/12/1930  Today's Date: 10/05/2017 SLP Individual Time: 1215-1300 SLP Individual Time Calculation (min): 45 min  Short Term Goals: Week 3: SLP Short Term Goal 1 (Week 3): Pt will consume dys 3 textures and thin liquids with min cues for use of chin tuck. SLP Short Term Goal 1 - Progress (Week 3): Updated due to goal met SLP Short Term Goal 2 (Week 3): Pt will recall new, daily information with Min A verbal cues for external aids. SLP Short Term Goal 3 (Week 3): Pt will demonstrate 70% intelligibility at sentence level with Mod A verbal cues for use of speech intelligibility strategies.  SLP Short Term Goal 4 (Week 3): Pt will demonstrate sustained attention to functional tasks for 45 minutes with Min A verbal cues for redirection. SLP Short Term Goal 5 (Week 3): Pt will demonstrate functional problem solving in basic and familiar tasks with Min A verbal cues.  Skilled Therapeutic Interventions:Skilled ST services focused on swallow and speech skills. SLP facilitated PO consumption of recently upgraded thin liquid diet, per MBS this morning and trials of dys 3. Pt required mod-min A verbal cues for chin tuck with liquids and to consume small bites/sips with no overt s/s aspiration and 75% intake. SLP facilitated return demonstration of IMST device set at 13 cm h20, pt demonstrated two sets 5 and then became fatigued, discontinuing exercises. Pt and wife reported completing 20 exercises this morning piror to [name] session. SLP provided education of new diet and compensatory strategies for upgraded diet, family stated understanding, however requires education for supervision.  Pt was left in room with call bell within reach and bed alaram set.ST reccomends to continuedd skilled ST services.     Function:  Eating Eating   Modified Consistency Diet: Yes Eating Assist Level: Set up  assist for;Supervision or verbal cues   Eating Set Up Assist For: Opening containers       Cognition Comprehension Comprehension assist level: Understands basic 75 - 89% of the time/ requires cueing 10 - 24% of the time  Expression   Expression assist level: Expresses basic 50 - 74% of the time/requires cueing 25 - 49% of the time. Needs to repeat parts of sentences.;Expresses basic 75 - 89% of the time/requires cueing 10 - 24% of the time. Needs helper to occlude trach/needs to repeat words.  Social Interaction Social Interaction assist level: Interacts appropriately 90% of the time - Needs monitoring or encouragement for participation or interaction.  Problem Solving Problem solving assist level: Solves basic 50 - 74% of the time/requires cueing 25 - 49% of the time;Solves basic 25 - 49% of the time - needs direction more than half the time to initiate, plan or complete simple activities  Memory Memory assist level: Recognizes or recalls 50 - 74% of the time/requires cueing 25 - 49% of the time;Recognizes or recalls 75 - 89% of the time/requires cueing 10 - 24% of the time    Pain Pain Assessment Pain Score: 0-No pain  Therapy/Group: Individual Therapy  Walter Bryant  [facility] 10/05/2017, 1:52 PM

## 2017-10-05 NOTE — Progress Notes (Signed)
Physical Therapy Session Note  Patient Details  Name: Walter Bryant MRN: [id number] Date of Birth: 1931/02/11  Today's Date: 10/05/2017 PT Individual Time: 1310-1355 PT Individual Time Calculation (min): 45 min   Short Term Goals: Week 3:  PT Short Term Goal 1 (Week 3): STG = LTG due to estimated d/c date.   Skilled Therapeutic Interventions/Progress Updates:  Pt received in w/c & agreeable to tx, with wife present for session. Transported pt to dayroom via total assist and pt transferred to standing in standing frame and tolerated upright for 3 minutes then 2 minutes with mirror for visual feedback. Activity focused on weight bearing through BLE for strengthening and NMR, and overall activity tolerance and endurance. Pt with very minimal to no anterior weight shifting to assist with sit>stand transfer. Pt required seated rest breaks, becoming somewhat upset 2/2 fatigue. Pt returned to room and transferred to bed via maximove with +2 assist due to suspected incontinence & pt unable to report either way. Pt not incontinent but left in bed with alarm set & wife & MD in room. Pt missed 15 minutes of skilled PT treatment 2/2 fatigue.   Pt noted to have small skin tear on R forearm & RN made aware.   Therapy Documentation Precautions:  Precautions Precautions: Fall, Other (comment) Precaution Comments: R hemi, lateral lean and pushes to the R; Cortrak Required Braces or Orthoses: Other Brace/Splint Other Brace/Splint: bilateral upright AFO's (used PTA. Has h/o charcot foot on L) Restrictions Weight Bearing Restrictions: No   General: PT Amount of Missed Time (min): 15 Minutes PT Missed Treatment Reason: Patient fatigue    See Function Navigator for Current Functional Status.   Therapy/Group: Individual Therapy  [name] 10/05/2017, 2:05 PM

## 2017-10-05 NOTE — Progress Notes (Signed)
Subjective/Complaints:  Poor sleep last night.  Patient would like to stop in and out catheterizations.  We discussed the pros and cons of this.  Patient states that it is not painful he just does not like to be woken up for this. Patient has passed his modified barium swallow and is now on a D3 thin liquid diet.  He reports eating about 80% of his lunch which is a large improvement compared to previous  ROS: Limited due to cognitive/behavioral , no pains no breathing problems , no nausea or vomiting,   Objective: Vital Signs: Blood pressure (!) 148/70, pulse 71, temperature 98 F (36.7 C), temperature source Oral, resp. rate 18, height 6\' 2"  (1.88 m), weight 98.1 kg (216 lb 4.3 oz), SpO2 100 %. Dg Swallowing Func-speech Pathology  Result Date: 10/05/2017 Objective Swallowing Evaluation: Type of Study: MBS-Modified Barium Swallow Study  Patient Details Name: Walter Bryant MRN: [id number] Date of Birth: February 17, 1931 Today's Date: 10/05/2017 Past Medical History: Past Medical History: Diagnosis Date . A-fib ([place])  . AAA (abdominal aortic aneurysm) ([place])  . Abnormality of gait 12/20/2013 . Anxiety and depression  . Anxiety and depression  . Arthritis  . Barrett's esophagus  . BPH (benign prostatic hyperplasia)  . Degenerative arthritis  . GERD (gastroesophageal reflux disease)  . History of concussion  . HOH (hard of hearing)  . Hyperlipidemia  . Hypertension  . Left tibial fracture  . Macular degeneration Left . Polyneuropathy in other diseases classified elsewhere ([place]) 12/20/2013 . Right clavicle fracture  . Spinal stenosis  . Transient global amnesia  . Vertigo  Past Surgical History: Past Surgical History: Procedure Laterality Date . ABDOMINAL AORTIC ANEURYSM REPAIR  2016  stent placement . APPENDECTOMY   . CATARACT EXTRACTION    Bilateral . KNEE SURGERY Bilateral   both  knees replaced . LAPAROSCOPIC RIGHT HEMI COLECTOMY   . LUMBAR LAMINECTOMY    L4  . REFRACTIVE SURGERY Left   macular degeneration .  TONSILLECTOMY AND ADENOIDECTOMY   . TOTAL HIP ARTHROPLASTY Right   x 2 . 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 Pt admitted for stroke work up with MRI revealing "1. Acute left basal ganglia infarct. 2. Punctate acute infarcts in the right frontal and posterior. 3. Mild-to-moderate chronic small vessel ischemic disease and moderate cerebral atrophy." Daughter reports recent pneumonia. ED visit noted 08/13/17. Most recent CXR 5/29 shows "Worsening left basilar airspace opacity is concerning for recurrent pneumonia."  Pt has had fluctuating presentation of deficits since with increasing dysarthria and dysphagia.  Diet has changed multiple times given inconsistent clinical presentation.  Subjective: Pt awake, alert Assessment / Plan / Recommendation CHL IP CLINICAL IMPRESSIONS 10/05/2017 Clinical Impression Pt presents with improvements in swallowing function in comparison to previous MBS.  Pt's deficits appear to be primarily centered around oral motor weakness leading to impaired posterior transit and containment of boluses and overall prolonged oral phase.  As a result of poor posterior containment, pt had premature spillage of portions of boluses into the oropharynx which pooled at the level of the vallecula.  Despite premature spillage, pt's swallow was timely due to being able to contain the majority of the bolus prior to swallow initiation.  Pt appeared to have decreased coordination of airway closure during the swallow due to decreased cardiorespiratory endurance when challenged to take large consecutive  sips of thin liquids via straw which resulted in high penetration which did not clear spontaneously during the swallow but almost completely cleared with cues for volitional cough followed by second swallow.  Containment of boluses  was improved with use of chin tuck which eliminated premature spillage.  As a result, recommend that pt's diet be advanced to dys 3 textures and thin liquids with full supervision for use of chin tuck.  Pt's significant debility and physical decline over the last two weeks puts him at an increased risk of aspiration in the setting of fatigue and fluctuating mentation throughout the day despite having a relatively clean study today.  Pt also remains at risk of inadequate nutrition and hydration given that his PO intake remains poor.   SLP Visit Diagnosis Dysphagia, oropharyngeal phase (R13.12) Attention and concentration deficit following -- Frontal lobe and executive function deficit following -- Impact on safety and function Moderate aspiration risk     Prognosis 10/05/2017 Prognosis for Safe Diet Advancement Good Barriers to Reach Goals -- Barriers/Prognosis Comment -- CHL IP DIET RECOMMENDATION 10/05/2017 SLP Diet Recommendations Dysphagia 3 (Mech soft) solids;Thin liquid Liquid Administration via Cup;Straw Medication Administration Crushed with puree Compensations Slow rate;Small sips/bites;Use straw to facilitate chin tuck Postural Changes --            CHL IP ORAL PHASE 10/05/2017 Oral Phase Impaired Oral - Pudding Teaspoon -- Oral - Pudding Cup -- Oral - Honey Teaspoon -- Oral - Honey Cup -- Oral - Nectar Teaspoon -- Oral - Nectar Cup Weak lingual manipulation;Lingual pumping;Reduced posterior propulsion;Delayed oral transit;Decreased bolus cohesion;Premature spillage Oral - Nectar Straw -- Oral - Thin Teaspoon Weak lingual manipulation;Lingual pumping;Reduced posterior propulsion;Delayed oral transit;Decreased bolus cohesion;Premature spillage Oral - Thin Cup -- Oral - Thin Straw Decreased bolus cohesion;Premature spillage Oral - Puree -- Oral - Mech Soft -- Oral - Regular -- Oral - Multi-Consistency Weak lingual manipulation;Lingual pumping;Impaired mastication;Delayed oral transit;Decreased bolus  cohesion;Premature spillage;Reduced posterior propulsion Oral - Pill -- Oral Phase - Comment --  CHL IP PHARYNGEAL PHASE 10/05/2017 Pharyngeal Phase Impaired Pharyngeal- Pudding Teaspoon -- Pharyngeal -- Pharyngeal- Pudding Cup -- Pharyngeal -- Pharyngeal- Honey Teaspoon -- Pharyngeal -- Pharyngeal- Honey Cup -- Pharyngeal -- Pharyngeal- Nectar Teaspoon -- Pharyngeal -- Pharyngeal- Nectar Cup WFL Pharyngeal -- Pharyngeal- Nectar Straw -- Pharyngeal -- Pharyngeal- Thin Teaspoon Reduced airway/laryngeal closure;Reduced tongue base retraction Pharyngeal -- Pharyngeal- Thin Cup -- Pharyngeal -- Pharyngeal- Thin Straw Reduced airway/laryngeal closure;Reduced tongue base retraction;Penetration/Aspiration during swallow Pharyngeal Material enters airway, remains ABOVE vocal cords and not ejected out Pharyngeal- Puree -- Pharyngeal -- Pharyngeal- Mechanical Soft -- Pharyngeal -- Pharyngeal- Regular -- Pharyngeal -- Pharyngeal- Multi-consistency WFL Pharyngeal -- Pharyngeal- Pill -- Pharyngeal -- Pharyngeal Comment --  CHL IP CERVICAL ESOPHAGEAL PHASE 10/05/2017 Cervical Esophageal Phase WFL Pudding Teaspoon -- Pudding Cup -- Honey Teaspoon -- Honey Cup -- Nectar Teaspoon -- Nectar Cup -- Nectar Straw -- Thin Teaspoon -- Thin Cup -- Thin Straw -- Puree -- Mechanical Soft -- Regular -- Multi-consistency -- Pill -- Cervical Esophageal Comment -- No flowsheet data found. Page, Selinda Orion 10/05/2017, 11:50 AM              No results found for this or any previous visit (from the past 72 hour(s)).   Constitutional: No distress . Vital signs reviewed. HEENT: EOMI, oral membranes moist Neck: supple Cardiovascular: RRR without murmur. No JVD    Respiratory: CTA Bilaterally without wheezes or rales. Normal effort    GI: BS +, non-tender, non-distended  Skin:   Other IV sites and nares CDI Neuro:  Alert, follows simple commands.  Decreased insight and awareness., Abnormal Sensory Reduced bilateral below ankles, Abnormal Motor  3-/5 RUE grip, 2- RUE Bicepts and Triceps and 2- RLE hip/knee extensor synergy as well as plantarflexion.  Moderately dysarthric Musc/Skel:  Other mild pain with Right shoulder ext rotation, neg impingement sign Psych: Remains restless/anxious  Assessment/Plan: 1. 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 Physiatrist is providing close team supervision and 24 hour management of active medical problems listed below. Physiatrist and rehab team continue to assess barriers to discharge/monitor patient progress toward functional and medical goals. FIM: Function - Bathing Position: Bed Body parts bathed by helper: Right arm, Left arm, Chest, Abdomen, Front perineal area, Buttocks, Right upper leg, Left upper leg, Right lower leg, Left lower leg, Back Assist Level: 2 helpers  Function- Upper Body Dressing/Undressing What is the patient wearing?: Pull over shirt/dress Pull over shirt/dress - Perfomed by patient: Pull shirt over trunk, Put head through opening Pull over shirt/dress - Perfomed by helper: Thread/unthread left sleeve, Thread/unthread right sleeve, Put head through opening Assist Level: Touching or steadying assistance(Pt > 75%) Function - Lower Body Dressing/Undressing What is the patient wearing?: Pants Position: Bed Pants- Performed by helper: Pull pants up/down Socks - Performed by helper: Don/doff right sock, Don/doff left sock Shoes - Performed by helper: Don/doff right shoe, Don/doff left shoe, Fasten right, Fasten left AFO - Performed by helper: Don/doff right AFO, Don/doff left AFO TED Hose - Performed by helper: Don/doff right TED hose, Don/doff left TED hose Assist for footwear: Maximal assist Assist for lower body dressing: 2 Helpers  Function - Toileting Toileting activity did not occur: Safety/medical concerns Toileting steps  completed by helper: Adjust clothing prior to toileting, Performs perineal hygiene, Adjust clothing after toileting Toileting Assistive Devices: Other (comment)(Using [employer]) Assist level: Two helpers  Function - Air cabin crew transfer activity did not occur: Safety/medical concerns Toilet transfer assistive device: Facilities manager lift: Garment/textile technologist level to toilet: 2 helpers Assist level from toilet: 2 helpers  Function - Chair/bed transfer Chair/bed transfer [place]: Lateral scoot Chair/bed transfer assist level: 2 helpers Chair/bed transfer assistive device: Facilities manager lift: [place] Chair/bed transfer details: Tactile cues for initiation, Tactile cues for sequencing, Tactile cues for weight shifting, Tactile cues for posture, Tactile cues for placement, Verbal cues for sequencing, Verbal cues for technique, Manual facilitation for placement, Manual facilitation for weight shifting  Function - Locomotion: Wheelchair Will patient use wheelchair at discharge?: Yes Type: Manual Max wheelchair distance: 90 ft  Assist Level: Supervision or verbal cues Assist Level: Supervision or verbal cues Assist Level: Dependent (Pt equals 0%) Turns around,maneuvers to table,bed, and toilet,negotiates 3% grade,maneuvers on rugs and over doorsills: No Function - Locomotion: Ambulation Ambulation activity did not occur: Safety/medical concerns Assistive device: Rail in hallway Max distance: 2 Assist level: 2 helpers Walk 10 feet activity did not occur: Safety/medical concerns Walk 50 feet with 2 turns activity did not occur: Safety/medical concerns Walk 150 feet activity did not occur: Safety/medical concerns Walk 10 feet on uneven surfaces activity did not occur: Safety/medical concerns  Function - Comprehension Comprehension: Auditory Comprehension assistive device: Hearing aids Comprehension assist level: Understands basic 75 - 89% of the time/ requires  cueing 10 - 24% of the time  Function - Expression Expression: Verbal Expression assist level: Expresses basic 50 -  74% of the time/requires cueing 25 - 49% of the time. Needs to repeat parts of sentences., Expresses basic 75 - 89% of the time/requires cueing 10 - 24% of the time. Needs helper to occlude trach/needs to repeat words.  Function - Social Interaction Social Interaction assist level: Interacts appropriately 90% of the time - Needs monitoring or encouragement for participation or interaction.  Function - Problem Solving Problem solving assist level: Solves basic 50 - 74% of the time/requires cueing 25 - 49% of the time, Solves basic 25 - 49% of the time - needs direction more than half the time to initiate, plan or complete simple activities  Function - Memory Memory assistive device: Memory book Memory assist level: Recognizes or recalls 50 - 74% of the time/requires cueing 25 - 49% of the time, Recognizes or recalls 75 - 89% of the time/requires cueing 10 - 24% of the time Patient normally able to recall (first 3 days only): None of the above  Medical Problem List and Plan: 1.  Right side weakness with dysarthria secondary to multiple bilateral infarcts (left BG, tiny Right frontal and bilateral  occipital infarcts) consistent with cardiac emboli with history of atrial fibrillation.Onset 5/29 Team conf in am   Motor strength improving in RUE,discussed progress with family  Continue CIR PT OT speech, 2.  DVT Prophylaxis/Anticoagulation: Eliquis 3. Pain Management/polyneuropathy: Tylenol as needed 4. Mood/sleep/confusion: Patient is quite happy with the Remeron.  States that his sleep is interrupted by catheterization and other care needs.    sleep graph 5. Neuropsych: This patient is capable of making decisions on his own behalf. 6. Skin/Wound Care: Routine skin checks 7. Fluids/Electrolytes/Nutrition: BMET normal 6/4  -Appreciate dietary consult  8.  Dysphasia.  Dysphasia  #2 nectar liquids.Cortrak tube feeds for nutritional support.  Check calorie counts.    Repeat modified this week as per speech therapy    -trial Megace ongoing with sluggish results so far. With AMS above, will go ahead and hold for now as well   -H20 boluses 9.  History of atrial fibrillation.  Continue Eliquis 5mg  BID.  Cardiac rate controlled, on metoprolol PTA 10.  Hyperlipidemia.  Zocor on lipitor PTA 11.  History of polyneuropathy.  Patient uses bilateral AFO braces prior to admission.  No up with her deficits however 12.  Hx HTN lasix and Lisinopril PTA Vitals:   10/04/17 2017 10/05/17 0414  BP: (!) 148/96 (!) 148/70  Pulse: 76 71  Resp: 16 18  Temp: 98 F (36.7 C) 98 F (36.7 C)  SpO2: 93% 100%  BPs controlled 6/19 13.  Hx of GERD-  Pepcid to 40mg  BID, added protonix 40mg  daily, improved 14.  Nausea without vomiting, question medication versus tube feeding versus constipation.  Appears to have improved partially 15.  Constipation improving per pt 16.  Urinary retention  likely multifactorial immobility and prob BPH,   flomax increased to home dose 0.8mg , added urecholine will titrate up to 25mg  TID  -Urine culture positive enterococcus  On Amoxil Will continue in and out catheterizations, he would likely require less catheterization at night without the nocturnal tube feeds.  17.  sleep wake cycle disruption, improved sleep last noc   increased Remeron, increase Ritalin breakfast and lunch 18.  Overall patient has had some neurologic improvements however the effort of intensive inpatient rehabilitation is too much for him and he will have a goals of care meeting with the palliative medicine team on Friday. Have reduced therapy scheduled to daily for  each discipline Daughter who is [phone] (Days) [place] 10/05/2017, 2:05 PM

## 2017-10-05 NOTE — Progress Notes (Signed)
Modified Barium Swallow Progress Note  Patient Details  Name: Walter Bryant MRN: [id number] Date of Birth: 1930-08-20  Today's Date: 10/05/2017  Modified Barium Swallow completed.  Full report located under Chart Review in the Imaging Section.  Brief recommendations include the following:  Clinical Impression    Pt presents with improvements in swallowing function in comparison to previous MBS.  Pt's deficits appear to be primarily centered around oral motor weakness leading to impaired posterior transit and containment of boluses and overall prolonged oral phase.  As a result of poor posterior containment, pt had premature spillage of portions of boluses into the oropharynx which pooled at the level of the vallecula.  Despite premature spillage, pt's swallow was timely due to being able to contain the majority of the bolus prior to swallow initiation.  Pt appeared to have decreased coordination of airway closure during the swallow due to decreased cardiorespiratory endurance when challenged to take large consecutive sips of thin liquids via straw which resulted in high penetration which did not clear spontaneously during the swallow but almost completely cleared with cues for volitional cough followed by second swallow.  Containment of boluses was improved with use of chin tuck which eliminated premature spillage.  As a result, recommend that pt's diet be advanced to dys 3 textures and thin liquids with full supervision for use of chin tuck.  Pt's significant debility and physical decline over the last two weeks puts him at an increased risk of aspiration in the setting of fatigue and fluctuating mentation throughout the day despite having a relatively clean study today.  Pt also remains at risk of inadequate nutrition and hydration given that his PO intake remains poor.     Swallow Evaluation Recommendations       SLP Diet Recommendations: Dysphagia 3 (Mech soft) solids;Thin liquid   Liquid  Administration via: Cup;Straw   Medication Administration: Crushed with puree   Supervision: Full supervision/cueing for compensatory strategies   Compensations: Slow rate;Small sips/bites;Use straw to facilitate chin tuck                Stephaniemarie Stoffel, Elmyra Ricks L 10/05/2017,12:09 PM

## 2017-10-05 NOTE — Progress Notes (Signed)
Occupational Therapy Session Note  Patient Details  Name: Walter Bryant MRN: [id number] Date of Birth: 09-Aug-1930  Today's Date: 10/05/2017 OT Individual Time: 2947-6546 OT Individual Time Calculation (min): 40 min  and Today's Date: 10/05/2017 OT Missed Time: 20 Minutes Missed Time Reason: Patient fatigue   Short Term Goals: Week 3:  OT Short Term Goal 1 (Week 3): STG=LTG due to LOS  Skilled Therapeutic Interventions/Progress Updates:    Pt seen for OT session focusing on ADL re-training, attention to task and functional sitting balance/endurance. Pt restless but asleep in supine upon arrival, increased time and stimuli required to awaken and arouse. Pt slowly becoming more alert though breathing remained rapid and shallow with cuing for deep breathing techniques. Pants donned total A in supine, pt with decreased control and proprioceptive abilities in L LE to assist with threading into pants. He rolled for pants to be pulled up with +2 assist. Pt transferred to sitting EOB with total A +1, pt able to assist only by bringing L LE off EOB.  He sat EOB, initially with max A due to R lean with multimodal cuing provided to facilitate midline orientation. At best, pt able to sit with guarding assist, instances of self correcting R lean. Pt donned shirt seated EOB, requiring max A for clothing orientation/ set-up. He was able to independently recall hemi dressing techniques.  Pt tolerated ~10 minutes seated EOB before stating he was too fatigued and needed to return to supine. Max A to return to supine and +2 to slide to [facility]. Pt left seated upright in bed with bed rail up, bed alarm on and all needs in reach.  Pt's wife arrived at end of session, assisted to answering questions within scope of practice regarding nursing and d/c planning.   Pt remained with eyes closed for majority of session with deep labored breathing throughout. He was oriented to time only.   Therapy Documentation Precautions:   Precautions Precautions: Fall, Other (comment) Precaution Comments: R hemi, lateral lean and pushes to the R; Cortrak Required Braces or Orthoses: Other Brace/Splint Other Brace/Splint: bilateral upright AFO's (used PTA. Has h/o charcot foot on L) Restrictions Weight Bearing Restrictions: No Pain:    No/denies pain ADL: ADL ADL Comments: see functional navigator  See Function Navigator for Current Functional Status.   Therapy/Group: Individual Therapy  [name] L 10/05/2017, 7:04 AM

## 2017-10-05 NOTE — Plan of Care (Signed)
Pt's plan of care adjusted to QD after speaking with care team and discussed with MD in team conference as pt currently unable to tolerate current therapy schedule with OT, PT, and SLP.   

## 2017-10-05 NOTE — Plan of Care (Signed)
  Problem: Consults Goal: RH STROKE PATIENT EDUCATION Description See Patient Education module for education specifics  Outcome: Progressing Goal: Nutrition Consult-if indicated Outcome: Progressing   Problem: RH BOWEL ELIMINATION Goal: RH STG MANAGE BOWEL WITH ASSISTANCE Description STG Manage Bowel with moderate Assistance.  Outcome: Progressing Goal: RH STG MANAGE BOWEL W/MEDICATION W/ASSISTANCE Description STG Manage Bowel with Medication with min Assistance.  Outcome: Progressing   Problem: RH BLADDER ELIMINATION Goal: RH STG MANAGE BLADDER WITH ASSISTANCE Description STG Manage Bladder With moderate Assistance  Outcome: Progressing Goal: RH STG MANAGE BLADDER WITH EQUIPMENT WITH ASSISTANCE Description STG Manage Bladder With Equipment and With total Assistance  Outcome: Progressing   Problem: RH SKIN INTEGRITY Goal: RH STG SKIN FREE OF INFECTION/BREAKDOWN Description Skin to remain free from breakdown with min assistance while controlling incontinence by toileting patient every 4 hours or more frequently as needed by patient.  Outcome: Progressing Goal: RH STG MAINTAIN SKIN INTEGRITY WITH ASSISTANCE Description STG Maintain Skin Integrity With moderate Assistance.  Outcome: Progressing Goal: RH STG ABLE TO PERFORM INCISION/WOUND CARE W/ASSISTANCE Description STG Able To Perform skin Care With moderate Assistance.  Outcome: Progressing   Problem: RH SAFETY Goal: RH STG ADHERE TO SAFETY PRECAUTIONS W/ASSISTANCE/DEVICE Description STG Adhere to Safety Precautions With moderate Assistance and appropriate assistive Devices.  Outcome: Progressing Goal: RH STG DECREASED RISK OF FALL WITH ASSISTANCE Description STG Decreased Risk of Fall With moderate Assistance while following CIR's new standard toileting protocol of every 4 hours.  Outcome: Progressing   Problem: RH PAIN MANAGEMENT Goal: RH STG PAIN MANAGED AT OR BELOW PT'S PAIN GOAL Description < 3 on a 0-10  pain scale  Outcome: Progressing   Problem: RH KNOWLEDGE DEFICIT Goal: RH STG INCREASE KNOWLEDGE OF HYPERTENSION Description Patient and spouse will be able to demonstrate knowledge of new HTN medications, including dosage amounts, dosages per day, and follow-up care with the MD post discharge with min assist from rehab staff.  Outcome: Progressing Goal: RH STG INCREASE KNOWLEDGE OF DYSPHAGIA/FLUID INTAKE Description Patient and spouse will be able to demonstrate knowledge of safe swallowing techniques, and proper techniques for thickening liquids with min assist from rehab staff.  Outcome: Progressing

## 2017-10-06 ENCOUNTER — Inpatient Hospital Stay (HOSPITAL_COMMUNITY): Payer: Medicare Other | Admitting: Physical Therapy

## 2017-10-06 ENCOUNTER — Inpatient Hospital Stay (HOSPITAL_COMMUNITY): Payer: Medicare Other | Admitting: Occupational Therapy

## 2017-10-06 ENCOUNTER — Inpatient Hospital Stay (HOSPITAL_COMMUNITY): Payer: Medicare Other | Admitting: Speech Pathology

## 2017-10-06 LAB — CBC
HEMATOCRIT: 46.9 % (ref 39.0–52.0)
HEMOGLOBIN: 15.7 g/dL (ref 13.0–17.0)
MCH: 31.8 pg (ref 26.0–34.0)
MCHC: 33.5 g/dL (ref 30.0–36.0)
MCV: 95.1 fL (ref 78.0–100.0)
Platelets: 249 10*3/uL (ref 150–400)
RBC: 4.93 MIL/uL (ref 4.22–5.81)
RDW: 14.3 % (ref 11.5–15.5)
WBC: 9.1 10*3/uL (ref 4.0–10.5)

## 2017-10-06 LAB — CREATININE, SERUM: Creatinine, Ser: 1.04 mg/dL (ref 0.61–1.24)
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
## 2017-10-06 NOTE — Progress Notes (Signed)
Occupational Therapy Session Note  Patient Details  Name: Walter Bryant MRN: [id number] Date of Birth: April 12, 1931  Today's Date: 10/06/2017 OT Individual Time: 1130-1200 OT Individual Time Calculation (min): 30 min    Short Term Goals: Week 3:  OT Short Term Goal 1 (Week 3): STG=LTG due to LOS  Skilled Therapeutic Interventions/Progress Updates:    Pt seen for OT session focusing on ADL re-training and w/c positioning. Pt sitting upright in tilt-in-space w/c upon arrival, agreeable to tx session. Planned cathing with nursing. OT assisted with positioning and clothing management for cathing to be completed from tilt-in-space w/c.  Pt able to doff shirt independently with increased time, assist for set-up of shirt sleeve and pt able to manage R UE into shirt sleeve using L UE. Pt able to anteriorly lean in chair for shirt to be pulled over trunk. Displaying more truncal control and mobility this session compared to previous. Lunch tray arrived, therapist set-up pt with lunch tray, R UE in weightbearing on table and for attention to extremity. Cont to educate and encourage for independence with self feeding tasks. Pt left sitting up with lunch tray and wife present, all needs in reach.  Addressed w/c sitting positioning, placing rolled towels along R and L thighs to facilitate internal rotation to midline position for siting posture.  Therapy Documentation Precautions:  Precautions Precautions: Fall, Other (comment) Precaution Comments: R hemi, lateral lean and pushes to the R; Cortrak Required Braces or Orthoses: Other Brace/Splint Other Brace/Splint: bilateral upright AFO's (used PTA. 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   Therapy/Group: Individual Therapy  Juergen Hardenbrook L 10/06/2017, 7:12 AM

## 2017-10-06 NOTE — Patient Care Conference (Signed)
Inpatient RehabilitationTeam Conference and Plan of Care Update Date: 10/05/2017   Time: 10:35 AM    Patient Name: Walter Bryant [facility]      Medical Record Number: [id number]  Date of Birth: 1930-05-05 Sex: Male         Room/Bed: 4W26C/4W26C-01 Payor Info: Payor: MEDICARE / Plan: MEDICARE PART A AND B / Product Type: *No Product type* /    Admitting Diagnosis: CVA  Admit Date/Time:  09/19/2017  6:45 PM Admission Comments: No comment available   Primary Diagnosis:  <principal problem not specified> Principal Problem: <principal problem not specified>  Patient Active Problem List   Diagnosis Date Noted  . Basal ganglia stroke ([employer]) 09/19/2017  . Dysphagia, post-stroke   . PAF (paroxysmal atrial fibrillation) ([place])   . Hyperlipidemia   . Goals of care, counseling/discussion   . Hiccoughs   . Palliative care by specialist   . Acute ischemic stroke ([place]) 09/14/2017  . HTN (hypertension) 09/14/2017  . A-fib ([place]) 09/14/2017  . Hyponatremia 08/17/2017  . Fluid overload 08/16/2017  . Anxiety state 08/16/2017  . CAP (community acquired pneumonia) 08/15/2017  . Polyneuropathy in other diseases classified elsewhere ([place]) 12/20/2013  . Abnormality of gait 12/20/2013  . Non-healing right groin open wound 04/09/2013    Expected Discharge Date: Expected Discharge Date: (SNF next week)  Team Members Present: Physician leading conference: Dr. Alysia Penna Social Worker Present: Alfonse Alpers, LCSW Nurse Present: Other (comment)(Kayla Mabe, LPN) PT Present: Lavone Nian, PT OT Present: Napoleon Form, OT SLP Present: Charolett Bumpers, SLP PPS Coordinator present : Daiva Nakayama, RN, CRRN     Current Status/Progress Goal Weekly Team Focus  Medical   poor intake although improving , diet upgraded, poor sleep, minimal progress with mobility , urinary caths continue  adequate oral intake,   sleep med adjustment, improve appetite reduce TF volumes   Bowel/Bladder   incontinent of bowel, requires  in and out cath q 4-6, LBM 6-17  voiding with mod assist,  continent of bowel with assistance  perform q4-6 cath, timed toileting q2 whil awoke   Swallow/Nutrition/ Hydration   dys 2 and thin   Supervision A  dys 3 trials and thin tolerance    ADL's   +2 siding board transfers; max A bed mobility; total A toileting at bed level; mod A UB dressing; supervision/set-up grooming  Downgraded to max-total A overall  ADL re-training, neuro re-ed, functional mobility, d/c planning   Mobility   total assist maximove transfers, very poor endurance & activity tolerance, functional decline  downgraded to total assist transfers, min assist sitting balance, max assist bed mobility  pt/family education, d/c planning, activity tolerance, sitting balance, strengthening, endurance   Communication   Max-Mod A sentence level  Mod A - downgraded   speech intelligibilility strategies, IMST for breath support   Safety/Cognition/ Behavioral Observations  Max-Mod A  Mod-Min A - downgraded   sustained attention, basic problem solving and recall    Pain   no c/o pain  no pain  Assess pain q shift and prn   Skin   fissure to bottom, MASD to groin, peri and bottom barrier cream and microguard powder in use  no new skin issues  Assess skin q shift and prn    Rehab Goals Patient on target to meet rehab goals: Yes Rehab Goals Revised: goals were downgraded to max to total A *See Care Plan and progress notes for long and short-term goals.     Barriers to Discharge  Current  Status/Progress Possible Resolutions Date Resolved   Physician    Medical stability;Neurogenic Bowel & Bladder;Incontinence     minimal progress with exception of swallow and intake  Cont rehab but reduce intensity to SNF level      Nursing                  PT  Decreased caregiver support;Nutrition means;Behavior  wife unable to provide total assist, pt remains confused with functional decline              OT                  SLP                 SW                Discharge Planning/Teaching Needs:  Pt lives in independent living cottage at [place] and family plans for him to go to skilled rehab area before trying to be at home with wife and morning aide.  Defer to next venue.   Team Discussion:  Pt was sleeping well until the night before conference.  Dr. Letta Pate is decreasing pt's amount of time and volume of tube feedings at night.  Pt is tired of in and out caths and wants foley.  Pt with MBS on day of conference and liquids were upgraded to thins and D3 trials at lunch.  Pt's back to cognitive baseline on admission.  ST also working on respiratory support.  Pt has made little to no functional progress and PT/OT are questioning plan of care/goals of care.  Entire team recommending palliative care referral.  Pt is +2 with maxi move and can only tolerate 5 minutes in standing frame and a few on tilt table, even though he is motivated when he feels well and has slept the night before.  Pt's challenged by fatigue and endurance.  Revisions to Treatment Plan:  Team recommended changing pt's schedule to qd (each discipline 30 min/day).    Continued Need for Acute Rehabilitation Level of Care: The patient requires daily medical management by a physician with specialized training in physical medicine and rehabilitation for the following conditions: Daily direction of a multidisciplinary physical rehabilitation program to ensure safe treatment while eliciting the highest outcome that is of practical value to the patient.: Yes Daily medical management of patient stability for increased activity during participation in an intensive rehabilitation regime.: Yes Daily analysis of laboratory values and/or radiology reports with any subsequent need for medication adjustment of medical intervention for : Neurological problems;Diabetes problems  Sue Fernicola, Silvestre Mesi 10/06/2017, 9:39 PM

## 2017-10-06 NOTE — Progress Notes (Signed)
Social Work Patient ID: Walter Bryant, male   DOB: 08/29/1930, 84 y.o.   MRN: [id number]     Jhase Creppel, [name]  Social Worker    Patient Care Conference  Signed  Date of Service:  10/06/2017  9:39 PM          Signed          Show:Clear all [x] Manual[x] Template[] Copied  Added by: [x] Maty Zeisler, Gerline Legacy, LCSW   [] Hover for details   Inpatient RehabilitationTeam Conference and Plan of Care Update Date: 10/05/2017   Time: 10:35 AM      Patient Name: Walter Bryant [facility]      Medical Record Number: [id number]  Date of Birth: 03/27/1931 Sex: Male         Room/Bed: 4W26C/4W26C-01 Payor Info: Payor: MEDICARE / Plan: MEDICARE PART A AND B / Product Type: *No Product type* /     Admitting Diagnosis: CVA  Admit Date/Time:  09/19/2017  6:45 PM Admission Comments: No comment available    Primary Diagnosis:  <principal problem not specified> Principal Problem: <principal problem not specified>       Patient Active Problem List    Diagnosis Date Noted  . Basal ganglia stroke ([place]) 09/19/2017  . Dysphagia, post-stroke    . PAF (paroxysmal atrial fibrillation) ([place])    . Hyperlipidemia    . Goals of care, counseling/discussion    . Hiccoughs    . Palliative care by specialist    . Acute ischemic stroke ([place]) 09/14/2017  . HTN (hypertension) 09/14/2017  . A-fib ([place]) 09/14/2017  . Hyponatremia 08/17/2017  . Fluid overload 08/16/2017  . Anxiety state 08/16/2017  . CAP (community acquired pneumonia) 08/15/2017  . Polyneuropathy in other diseases classified elsewhere ([place]) 12/20/2013  . Abnormality of gait 12/20/2013  . Non-healing right groin open wound 04/09/2013      Expected Discharge Date: Expected Discharge Date: (SNF next week)   Team Members Present: Physician leading conference: Dr. Alysia Penna Social Worker Present: Alfonse Alpers, LCSW Nurse Present: Other (comment)(Kayla Mabe, LPN) PT Present: Lavone Nian, PT OT Present: Napoleon Form, OT SLP  Present: Charolett Bumpers, SLP PPS Coordinator present : Daiva Nakayama, RN, CRRN       Current Status/Progress Goal Weekly Team Focus  Medical     poor intake although improving , diet upgraded, poor sleep, minimal progress with mobility , urinary caths continue  adequate oral intake,   sleep med adjustment, improve appetite reduce TF volumes   Bowel/Bladder     incontinent of bowel, requires in and out cath q 4-6, LBM 6-17  voiding with mod assist,  continent of bowel with assistance  perform q4-6 cath, timed toileting q2 whil awoke   Swallow/Nutrition/ Hydration     dys 2 and thin   Supervision A  dys 3 trials and thin tolerance    ADL's     +2 siding board transfers; max A bed mobility; total A toileting at bed level; mod A UB dressing; supervision/set-up grooming  Downgraded to max-total A overall  ADL re-training, neuro re-ed, functional mobility, d/c planning   Mobility     total assist maximove transfers, very poor endurance & activity tolerance, functional decline  downgraded to total assist transfers, min assist sitting balance, max assist bed mobility  pt/family education, d/c planning, activity tolerance, sitting balance, strengthening, endurance   Communication     Max-Mod A sentence level  Mod A - downgraded   speech intelligibilility strategies, IMST for breath support   Safety/Cognition/  Behavioral Observations   Max-Mod A  Mod-Min A - downgraded   sustained attention, basic problem solving and recall    Pain     no c/o pain  no pain  Assess pain q shift and prn   Skin     fissure to bottom, MASD to groin, peri and bottom barrier cream and microguard powder in use  no new skin issues  Assess skin q shift and prn     Rehab Goals Patient on target to meet rehab goals: Yes Rehab Goals Revised: goals were downgraded to max to total A *See Care Plan and progress notes for long and short-term goals.      Barriers to Discharge   Current Status/Progress Possible Resolutions  Date Resolved   Physician     Medical stability;Neurogenic Bowel & Bladder;Incontinence     minimal progress with exception of swallow and intake  Cont rehab but reduce intensity to SNF level      Nursing                 PT  Decreased caregiver support;Nutrition means;Behavior  wife unable to provide total assist, pt remains confused with functional decline              OT                 SLP            SW              Discharge Planning/Teaching Needs:  Pt lives in independent living cottage at [place] and family plans for him to go to skilled rehab area before trying to be at home with wife and morning aide.  Defer to next venue.   Team Discussion:  Pt was sleeping well until the night before conference.  Dr. Letta Pate is decreasing pt's amount of time and volume of tube feedings at night.  Pt is tired of in and out caths and wants foley.  Pt with MBS on day of conference and liquids were upgraded to thins and D3 trials at lunch.  Pt's back to cognitive baseline on admission.  ST also working on respiratory support.  Pt has made little to no functional progress and PT/OT are questioning plan of care/goals of care.  Entire team recommending palliative care referral.  Pt is +2 with maxi move and can only tolerate 5 minutes in standing frame and a few on tilt table, even though he is motivated when he feels well and has slept the night before.  Pt's challenged by fatigue and endurance.  Revisions to Treatment Plan:  Team recommended changing pt's schedule to qd (each discipline 30 min/day).    Continued Need for Acute Rehabilitation Level of Care: The patient requires daily medical management by a physician with specialized training in physical medicine and rehabilitation for the following conditions: Daily direction of a multidisciplinary physical rehabilitation program to ensure safe treatment while eliciting the highest outcome that is of practical value to the patient.: Yes Daily  medical management of patient stability for increased activity during participation in an intensive rehabilitation regime.: Yes Daily analysis of laboratory values and/or radiology reports with any subsequent need for medication adjustment of medical intervention for : Neurological problems;Diabetes problems   Davin Archuletta, Silvestre Mesi 10/06/2017, 9:39 PM

## 2017-10-06 NOTE — Progress Notes (Signed)
Pt woke up confused. Pt states let me sleep. Pt has been sleeping throughout the night. VS are within normal parameters. Pt is disoriented x 4. Pt states " I dont know" when asked name, DOB, time, and situation. Marlowe Sh[place] PA-C notified via telephone. No new orders. Will continue to monitor pt's neurological status.

## 2017-10-06 NOTE — Progress Notes (Signed)
Occupational Therapy Session Note  Patient Details  Name: Walter Bryant MRN: [id number] Date of Birth: [birth year]  Today's Date: 10/06/2017 OT Individual Time: 4268-3419 OT Individual Time Calculation (min): 30 min    Skilled Therapeutic Interventions/Progress Updates: though patient stated he was fatigued and afraid.   He participated iin skilled OT as follows:  W/c practice scotting forwards x2 with max A Sit to stand= max to total A x2 (kyphotic posture and bottom poked out back, required clinician leg in front of patient right knee in order for him to bear weight through the leg) Oral care with right hand over hand assist and as stabilizer-overall max A due to decreased use of right hand and right neglect (somewhat)  He was left seated I nhis wheel chiar with wife present and call bell in hand at the end of the session     Therapy Documentation Precautions:  Precautions Precautions: Fall, Other (comment) Precaution Comments: R hemi, lateral lean and pushes to the R; Cortrak Required Braces or Orthoses: Other Brace/Splint Other Brace/Splint: bilateral upright AFO's (used PTA. 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 10/06/2017, 1:49 PM

## 2017-10-06 NOTE — Plan of Care (Signed)
  Problem: Consults Goal: RH STROKE PATIENT EDUCATION Description See Patient Education module for education specifics  Outcome: Progressing Goal: Nutrition Consult-if indicated Outcome: Progressing   Problem: RH BOWEL ELIMINATION Goal: RH STG MANAGE BOWEL WITH ASSISTANCE Description STG Manage Bowel with moderate Assistance.  Outcome: Progressing Goal: RH STG MANAGE BOWEL W/MEDICATION W/ASSISTANCE Description STG Manage Bowel with Medication with min Assistance.  Outcome: Progressing   Problem: RH BLADDER ELIMINATION Goal: RH STG MANAGE BLADDER WITH ASSISTANCE Description STG Manage Bladder With moderate Assistance  Outcome: Progressing Goal: RH STG MANAGE BLADDER WITH EQUIPMENT WITH ASSISTANCE Description STG Manage Bladder With Equipment and With total Assistance  Outcome: Progressing   Problem: RH SKIN INTEGRITY Goal: RH STG SKIN FREE OF INFECTION/BREAKDOWN Description Skin to remain free from breakdown with min assistance while controlling incontinence by toileting patient every 4 hours or more frequently as needed by patient.  Outcome: Progressing Goal: RH STG MAINTAIN SKIN INTEGRITY WITH ASSISTANCE Description STG Maintain Skin Integrity With moderate Assistance.  Outcome: Progressing Goal: RH STG ABLE TO PERFORM INCISION/WOUND CARE W/ASSISTANCE Description STG Able To Perform skin Care With moderate Assistance.  Outcome: Progressing   Problem: RH SAFETY Goal: RH STG ADHERE TO SAFETY PRECAUTIONS W/ASSISTANCE/DEVICE Description STG Adhere to Safety Precautions With moderate Assistance and appropriate assistive Devices.  Outcome: Progressing Goal: RH STG DECREASED RISK OF FALL WITH ASSISTANCE Description STG Decreased Risk of Fall With moderate Assistance while following CIR's new standard toileting protocol of every 4 hours.  Outcome: Progressing   Problem: RH PAIN MANAGEMENT Goal: RH STG PAIN MANAGED AT OR BELOW PT'S PAIN GOAL Description < 3 on a 0-10  pain scale  Outcome: Progressing   Problem: RH KNOWLEDGE DEFICIT Goal: RH STG INCREASE KNOWLEDGE OF HYPERTENSION Description Patient and spouse will be able to demonstrate knowledge of new HTN medications, including dosage amounts, dosages per day, and follow-up care with the MD post discharge with min assist from rehab staff.  Outcome: Progressing Goal: RH STG INCREASE KNOWLEDGE OF DYSPHAGIA/FLUID INTAKE Description Patient and spouse will be able to demonstrate knowledge of safe swallowing techniques, and proper techniques for thickening liquids with min assist from rehab staff.  Outcome: Progressing

## 2017-10-06 NOTE — Progress Notes (Signed)
Speech Language Pathology Daily Session Note  Patient Details  Name: Walter Bryant MRN: [id number] Date of Birth: 1930-05-01  Today's Date: 10/06/2017 SLP Individual Time: 0800-0830 SLP Individual Time Calculation (min): 30 min  Short Term Goals: Week 3: SLP Short Term Goal 1 (Week 3): Pt will consume dys 3 textures and thin liquids with min cues for use of chin tuck. SLP Short Term Goal 1 - Progress (Week 3): Updated due to goal met SLP Short Term Goal 2 (Week 3): Pt will recall new, daily information with Min A verbal cues for external aids. SLP Short Term Goal 3 (Week 3): Pt will demonstrate 70% intelligibility at sentence level with Mod A verbal cues for use of speech intelligibility strategies.  SLP Short Term Goal 4 (Week 3): Pt will demonstrate sustained attention to functional tasks for 45 minutes with Min A verbal cues for redirection. SLP Short Term Goal 5 (Week 3): Pt will demonstrate functional problem solving in basic and familiar tasks with Min A verbal cues.  Skilled Therapeutic Interventions: Skilled treatment session focused on dysphagia and cognition goals. SLP facilitated session by providing Max A for participation d/t perseveration on multiple internal distractions. Pt able to recall need for chin tuck with supervision question cues and was supervision with use. No overt s/s of aspiration with thin liquids. However pt with minimal consumption of breakfast meal d/t cognitive/mental deficits. With Max A cues, pt was able to replicate color block pattern, no awareness of mistakes. Pt was left upright in bed, wife present and all needs within reach. Continue per current plan of care.      Function:  Eating Eating   Modified Consistency Diet: No Eating Assist Level: Set up assist for;Supervision or verbal cues           Cognition Comprehension Comprehension assist level: Understands basic 75 - 89% of the time/ requires cueing 10 - 24% of the time  Expression    Expression assist level: Expresses basic 50 - 74% of the time/requires cueing 25 - 49% of the time. Needs to repeat parts of sentences.  Social Interaction Social Interaction assist level: Interacts appropriately 75 - 89% of the time - Needs redirection for appropriate language or to initiate interaction.  Problem Solving Problem solving assist level: Solves basic 50 - 74% of the time/requires cueing 25 - 49% of the time;Solves basic 25 - 49% of the time - needs direction more than half the time to initiate, plan or complete simple activities  Memory Memory assist level: Recognizes or recalls 25 - 49% of the time/requires cueing 50 - 75% of the time    Pain Pain Assessment Pain Scale: 0-10 Pain Score: 0-No pain  Therapy/Group: Individual Therapy  Lorne Winkels 10/06/2017, 11:22 AM

## 2017-10-06 NOTE — Progress Notes (Signed)
Subjective/Complaints:   Ate 50% of lunch and 75% dinner yesterday.  Was not hungry for breakfast this morning.  He slept fair.  He did not receive any tube feeds last night  ROS: Limited due to cognitive/behavioral , no pains no breathing problems , no nausea or vomiting,   Objective: Vital Signs: Blood pressure 123/85, pulse 76, temperature 98 F (36.7 C), temperature source Oral, resp. rate 18, height 6' 2"  (1.88 m), weight 98.1 kg (216 lb 4.3 oz), SpO2 100 %. Dg Swallowing Func-speech Pathology  Result Date: 10/05/2017 Objective Swallowing Evaluation: Type of Study: MBS-Modified Barium Swallow Study  Patient Details Name: AQUILLA VOILES MRN: [id number] Date of Birth: [birth year] Today's Date: 10/05/2017 Past Medical History: Past Medical History: Diagnosis Date . A-fib ([place])  . AAA (abdominal aortic aneurysm) ([place])  . Abnormality of gait 12/20/2013 . Anxiety and depression  . Anxiety and depression  . Arthritis  . Barrett's esophagus  . BPH (benign prostatic hyperplasia)  . Degenerative arthritis  . GERD (gastroesophageal reflux disease)  . History of concussion  . HOH (hard of hearing)  . Hyperlipidemia  . Hypertension  . Left tibial fracture  . Macular degeneration Left . Polyneuropathy in other diseases classified elsewhere ([place]) 12/20/2013 . Right clavicle fracture  . Spinal stenosis  . Transient global amnesia  . Vertigo  Past Surgical History: Past Surgical History: Procedure Laterality Date . ABDOMINAL AORTIC ANEURYSM REPAIR  2016  stent placement . APPENDECTOMY   . CATARACT EXTRACTION    Bilateral . KNEE SURGERY Bilateral   both  knees replaced . LAPAROSCOPIC RIGHT HEMI COLECTOMY   . LUMBAR LAMINECTOMY    L4  . REFRACTIVE SURGERY Left   macular degeneration . TONSILLECTOMY AND ADENOIDECTOMY   . TOTAL HIP ARTHROPLASTY Right   x 2 . 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 Pt admitted for stroke work up with MRI revealing "1. Acute left basal ganglia infarct. 2. Punctate acute infarcts in the right frontal and posterior. 3. Mild-to-moderate chronic small vessel ischemic disease and moderate cerebral atrophy." Daughter reports recent pneumonia. ED visit noted 08/13/17. Most recent CXR 5/29 shows "Worsening left basilar airspace opacity is concerning for recurrent pneumonia."  Pt has had fluctuating presentation of deficits since with increasing dysarthria and dysphagia.  Diet has changed multiple times given inconsistent clinical presentation.  Subjective: Pt awake, alert Assessment / Plan / Recommendation CHL IP CLINICAL IMPRESSIONS 10/05/2017 Clinical Impression Pt presents with improvements in swallowing function in comparison to previous MBS.  Pt's deficits appear to be primarily centered around oral motor weakness leading to impaired posterior transit and containment of boluses and overall prolonged oral phase.  As a result of poor posterior containment, pt had premature spillage of portions of boluses into the oropharynx which pooled at the level of the vallecula.  Despite premature spillage, pt's swallow was timely due to being able to contain the majority of the bolus prior to swallow initiation.  Pt appeared to have decreased coordination of airway closure during the swallow due to decreased cardiorespiratory endurance when challenged to take large consecutive sips of thin liquids via straw which resulted in high penetration which did not clear spontaneously during the swallow but almost completely cleared with cues for volitional cough followed by second swallow.  Containment of boluses was improved with use of chin tuck which eliminated  premature spillage.  As a result, recommend that pt's diet be advanced to dys 3 textures and thin liquids with full supervision for use of chin tuck.  Pt's significant debility  and physical decline over the last two weeks puts him at an increased risk of aspiration in the setting of fatigue and fluctuating mentation throughout the day despite having a relatively clean study today.  Pt also remains at risk of inadequate nutrition and hydration given that his PO intake remains poor.   SLP Visit Diagnosis Dysphagia, oropharyngeal phase (R13.12) Attention and concentration deficit following -- Frontal lobe and executive function deficit following -- Impact on safety and function Moderate aspiration risk     Prognosis 10/05/2017 Prognosis for Safe Diet Advancement Good Barriers to Reach Goals -- Barriers/Prognosis Comment -- CHL IP DIET RECOMMENDATION 10/05/2017 SLP Diet Recommendations Dysphagia 3 (Mech soft) solids;Thin liquid Liquid Administration via Cup;Straw Medication Administration Crushed with puree Compensations Slow rate;Small sips/bites;Use straw to facilitate chin tuck Postural Changes --            CHL IP ORAL PHASE 10/05/2017 Oral Phase Impaired Oral - Pudding Teaspoon -- Oral - Pudding Cup -- Oral - Honey Teaspoon -- Oral - Honey Cup -- Oral - Nectar Teaspoon -- Oral - Nectar Cup Weak lingual manipulation;Lingual pumping;Reduced posterior propulsion;Delayed oral transit;Decreased bolus cohesion;Premature spillage Oral - Nectar Straw -- Oral - Thin Teaspoon Weak lingual manipulation;Lingual pumping;Reduced posterior propulsion;Delayed oral transit;Decreased bolus cohesion;Premature spillage Oral - Thin Cup -- Oral - Thin Straw Decreased bolus cohesion;Premature spillage Oral - Puree -- Oral - Mech Soft -- Oral - Regular -- Oral - Multi-Consistency Weak lingual manipulation;Lingual pumping;Impaired mastication;Delayed oral transit;Decreased bolus cohesion;Premature spillage;Reduced posterior propulsion Oral - Pill -- Oral Phase - Comment --  CHL IP PHARYNGEAL PHASE 10/05/2017 Pharyngeal Phase Impaired Pharyngeal- Pudding Teaspoon -- Pharyngeal -- Pharyngeal- Pudding Cup -- Pharyngeal  -- Pharyngeal- Honey Teaspoon -- Pharyngeal -- Pharyngeal- Honey Cup -- Pharyngeal -- Pharyngeal- Nectar Teaspoon -- Pharyngeal -- Pharyngeal- Nectar Cup WFL Pharyngeal -- Pharyngeal- Nectar Straw -- Pharyngeal -- Pharyngeal- Thin Teaspoon Reduced airway/laryngeal closure;Reduced tongue base retraction Pharyngeal -- Pharyngeal- Thin Cup -- Pharyngeal -- Pharyngeal- Thin Straw Reduced airway/laryngeal closure;Reduced tongue base retraction;Penetration/Aspiration during swallow Pharyngeal Material enters airway, remains ABOVE vocal cords and not ejected out Pharyngeal- Puree -- Pharyngeal -- Pharyngeal- Mechanical Soft -- Pharyngeal -- Pharyngeal- Regular -- Pharyngeal -- Pharyngeal- Multi-consistency WFL Pharyngeal -- Pharyngeal- Pill -- Pharyngeal -- Pharyngeal Comment --  CHL IP CERVICAL ESOPHAGEAL PHASE 10/05/2017 Cervical Esophageal Phase WFL Pudding Teaspoon -- Pudding Cup -- Honey Teaspoon -- Honey Cup -- Nectar Teaspoon -- Nectar Cup -- Nectar Straw -- Thin Teaspoon -- Thin Cup -- Thin Straw -- Puree -- Mechanical Soft -- Regular -- Multi-consistency -- Pill -- Cervical Esophageal Comment -- No flowsheet data found. Page, Selinda Orion 10/05/2017, 11:50 AM              Results for orders placed or performed during the hospital encounter of 09/19/17 (from the past 72 hour(s))  CBC     Status: None   Collection Time: 10/06/17  6:42 AM  Result Value Ref Range   WBC 9.1 4.0 - 10.5 K/uL   RBC 4.93 4.22 - 5.81 MIL/uL   Hemoglobin 15.7 13.0 - 17.0 g/dL   HCT 46.9 39.0 - 52.0 %   MCV 95.1 78.0 - 100.0 fL   MCH 31.8 26.0 - 34.0 pg   MCHC 33.5 30.0 - 36.0 g/dL   RDW 14.3 11.5 - 15.5 %  Platelets 249 150 - 400 K/uL    Comment: Performed at [facility], [place] [address] [place], [place] [address]  Creatinine, serum     Status: None   Collection Time: 10/06/17  6:42 AM  Result Value Ref Range   Creatinine, Ser 1.04 0.61 - 1.24 mg/dL   GFR calc non Af Amer >60 >60 mL/min   GFR calc Af Amer >60 >60  mL/min    Comment: (NOTE) The eGFR has been calculated using the CKD EPI equation. This calculation has not been validated in all clinical situations. eGFR's persistently <60 mL/min signify possible Chronic Kidney Disease. Performed at [facility], [place] [address] [place], [place] [address]      Constitutional: No distress . Vital signs reviewed. HEENT: EOMI, oral membranes moist Neck: supple Cardiovascular: RRR without murmur. No JVD    Respiratory: CTA Bilaterally without wheezes or rales. Normal effort    GI: BS +, non-tender, non-distended  Skin:   Other IV sites and nares CDI Neuro:  Alert, follows simple commands.  Decreased insight and awareness., Abnormal Sensory Reduced bilateral below ankles, Abnormal Motor 3-/5 RUE grip, 2- RUE Bicepts and Triceps and 2- RLE hip/knee extensor synergy as well as plantarflexion.  Moderately dysarthric Musc/Skel:  Other mild pain with Right shoulder ext rotation, neg impingement sign Psych: Remains restless/anxious  Assessment/Plan: 1. 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 Physiatrist is providing close team supervision and 24 hour management of active medical problems listed below. Physiatrist and rehab team continue to assess barriers to discharge/monitor patient progress toward functional and medical goals. FIM: Function - Bathing Position: Bed Body parts bathed by helper: Right arm, Left arm, Chest, Abdomen, Front perineal area, Buttocks, Right upper leg, Left upper leg, Right lower leg, Left lower leg, Back Assist Level: 2 helpers  Function- Upper Body Dressing/Undressing What is the patient wearing?: Pull over shirt/dress Pull over shirt/dress - Perfomed by patient: Thread/unthread left sleeve, Put head through opening Pull over shirt/dress - Perfomed by helper: Thread/unthread right  sleeve, Put head through opening, Pull shirt over trunk Assist Level: Touching or steadying assistance(Pt > 75%) Function - Lower Body Dressing/Undressing What is the patient wearing?: Pants, AFO, Shoes, Socks Position: Bed Pants- Performed by helper: Thread/unthread right pants leg, Thread/unthread left pants leg, Pull pants up/down Socks - Performed by helper: Don/doff right sock, Don/doff left sock Shoes - Performed by helper: Don/doff right shoe, Don/doff left shoe, Fasten right, Fasten left AFO - Performed by helper: Don/doff right AFO, Don/doff left AFO TED Hose - Performed by helper: Don/doff right TED hose, Don/doff left TED hose Assist for footwear: Maximal assist Assist for lower body dressing: 2 Helpers  Function - Toileting Toileting activity did not occur: Safety/medical concerns Toileting steps completed by helper: Adjust clothing prior to toileting, Adjust clothing after toileting, Performs perineal hygiene Toileting Assistive Devices: Other (comment)(Using [employer]) Assist level: Two helpers  Function - Air cabin crew transfer activity did not occur: Safety/medical concerns Toilet transfer assistive device: Facilities manager lift: Garment/textile technologist level to toilet: 2 helpers Assist level from toilet: 2 helpers  Function - Chair/bed transfer Chair/bed transfer [place]: Lateral scoot Chair/bed transfer assist level: 2 helpers Chair/bed transfer assistive device: Sliding board Mechanical lift: [place] Chair/bed transfer details: Tactile cues for initiation, Tactile cues for sequencing, Tactile cues for weight shifting, Tactile cues for posture, Tactile cues for placement,  Verbal cues for sequencing, Verbal cues for technique, Manual facilitation for placement, Manual facilitation for weight shifting  Function - Locomotion: Wheelchair Will patient use wheelchair at discharge?: Yes Type: Manual Max wheelchair distance: 90 ft  Assist Level: Supervision or  verbal cues Assist Level: Supervision or verbal cues Assist Level: Dependent (Pt equals 0%) Turns around,maneuvers to table,bed, and toilet,negotiates 3% grade,maneuvers on rugs and over doorsills: No Function - Locomotion: Ambulation Ambulation activity did not occur: Safety/medical concerns Assistive device: Rail in hallway Max distance: 2 Assist level: 2 helpers Walk 10 feet activity did not occur: Safety/medical concerns Walk 50 feet with 2 turns activity did not occur: Safety/medical concerns Walk 150 feet activity did not occur: Safety/medical concerns Walk 10 feet on uneven surfaces activity did not occur: Safety/medical concerns  Function - Comprehension Comprehension: Auditory Comprehension assistive device: Hearing aids Comprehension assist level: Understands basic 75 - 89% of the time/ requires cueing 10 - 24% of the time  Function - Expression Expression: Verbal Expression assist level: Expresses basic 50 - 74% of the time/requires cueing 25 - 49% of the time. Needs to repeat parts of sentences.  Function - Social Interaction Social Interaction assist level: Interacts appropriately 75 - 89% of the time - Needs redirection for appropriate language or to initiate interaction.  Function - Problem Solving Problem solving assist level: Solves basic 25 - 49% of the time - needs direction more than half the time to initiate, plan or complete simple activities  Function - Memory Memory assistive device: Memory book Memory assist level: Recognizes or recalls 25 - 49% of the time/requires cueing 50 - 75% of the time Patient normally able to recall (first 3 days only): That he or she is in a hospital  Medical Problem List and Plan: 1.  Right side weakness with dysarthria secondary to multiple bilateral infarcts (left BG, tiny Right frontal and bilateral  occipital infarcts) consistent with cardiac emboli with history of atrial fibrillation.Onset 5/29 Team conf in am   Motor  strength improving in RUE,discussed progress with family  Continue CIR PT OT speech, 2.  DVT Prophylaxis/Anticoagulation: Eliquis 3. Pain Management/polyneuropathy: Tylenol as needed 4. Mood/sleep/confusion: Patient is quite happy with the Remeron.  States that his sleep is interrupted by catheterization and other care needs.    sleep graph 5. Neuropsych: This patient is capable of making decisions on his own behalf. 6. Skin/Wound Care: Routine skin checks 7. Fluids/Electrolytes/Nutrition: BMET normal 6/4  -Appreciate dietary consult  8.  Dysphasia.  Dysphasia #2 nectar liquids.Cortrak tube feeds for nutritional support.  Check calorie counts.    Repeat modified this week as per speech therapy    -trial Megace ongoing with sluggish results so far. With AMS above, will go ahead and hold for now as well   -H20 boluses 9.  History of atrial fibrillation.  Continue Eliquis 20m BID.  Cardiac rate controlled, on metoprolol PTA 10.  Hyperlipidemia.  Zocor on lipitor PTA 11.  History of polyneuropathy.  Patient uses bilateral AFO braces prior to admission.  No up with her deficits however 12.  Hx HTN lasix and Lisinopril PTA Vitals:   10/06/17 0359 10/06/17 1411  BP: 137/72 123/85  Pulse: 79 76  Resp: 18 18  Temp: 98 F (36.7 C) 98 F (36.7 C)  SpO2: 98% 100%  BPs controlled 6/19 13.  Hx of GERD-  Pepcid to 41mBID, added protonix 40108maily, improved 14.  Nausea without vomiting, question medication versus tube feeding versus constipation.  Appears to have improved partially 15.  Constipation improving per pt 16.  Urinary retention  likely multifactorial immobility and prob BPH,   flomax increased to home dose 0.27m, added urecholine will titrate up to 223mTID  -Urine culture positive enterococcus  On Amoxil Will continue in and out catheterizations, he would likely require less catheterization at night without the nocturnal tube feeds. Would plan to discharge home with Foley if patient  still requiring intermittent catheterizations 17.  sleep wake cycle disruption, improved sleep last noc   increased Remeron, increase Ritalin breakfast and lunch 18.  Overall patient has had some neurologic improvements however the effort of intensive inpatient rehabilitation is too much for him and he will have a goals of care meeting with the palliative medicine team on Friday. Have reduced therapy scheduled to daily for each discipline Daughter who is [phone] (Days) [place] Mariateresa Batra 10/06/2017, 4:41 PM

## 2017-10-06 NOTE — Progress Notes (Signed)
Physical Therapy Session Note  Patient Details  Name: RANON COVEN MRN: [id number] Date of Birth: [birth year]  Today's Date: 10/06/2017 PT Individual Time: 0900-0930 PT Individual Time Calculation (min): 30 min   Short Term Goals: Week 3:  PT Short Term Goal 1 (Week 3): STG = LTG due to estimated d/c date.   Skilled Therapeutic Interventions/Progress Updates:    Pt received seated in bed, agreeable to PT. Pt reports pain in R wrist to touch and wrist appears swollen, RN notified. Pt reports feeling fatigued although he did sleep better last night. Rolling L/R with max to total A to don pants. Dependent to don TED hose, socks, and shoes with B AFOs. Supine to sit with total A x 2. Sitting balance EOB with min to max A to maintain sitting balance due to lean to the R and posteriorly. Sliding board transfer bed to w/c with assist x 2 with mod multimodal cues for head/hips relationship during transfer. Pt left reclined in TIS w/c with needs in reach, wife present.  Therapy Documentation Precautions:  Precautions Precautions: Fall, Other (comment) Precaution Comments: R hemi, lateral lean and pushes to the R; Cortrak Required Braces or Orthoses: Other Brace/Splint Other Brace/Splint: bilateral upright AFO's (used PTA. Has h/o charcot foot on L) Restrictions Weight Bearing Restrictions: No  See Function Navigator for Current Functional Status.   Therapy/Group: Individual Therapy  Excell Seltzer, PT, DPT  10/06/2017, 3:25 PM

## 2017-10-06 NOTE — Progress Notes (Signed)
Social Work Patient ID: Walter Bryant, male   DOB: 1931/04/11, 84 y.o.   MRN: [id number]   CSW met with pt's wife, his son, and his dtr Walter Bryant) via telephone to update them on team conference discussion as pt was having lunch with ST to trial D3 diet.  CSW later updated pt.  Family had questions about therapy, d/c plan, pt's progress, etc.  CSW answered as many of these questions as possible and called Palliative Care Team (PCT) and scheduled pt/family meeting with them for Friday, June 21st at 10am.  Pt's dtr, Walter Bryant, will be here, as well as wife.  CSW talked with pt today and answered his questions about options post d/c from CIR at [facility].  He seemed relieved to have this discussion and was tearful at times when he admitted the CIR program was maybe too intense for him and that he never expected to be in this position.  CSW acknowledged how difficult this must be for pt and offered support.  Pt stated that [facility] is so inviting and welcoming and he became emotional again.  CSW stated that sometimes it's just time to move from the hospital and make a step closer to home.  He agreed he may be in that place.  Pt asked very appropriate and thoughtful questions and he will likely be in a good place for and appreciate goals of care meeting tomorrow with PCT.  CSW will work with [employer] after pt's wife and dtr meet with their SNF admissions coordinator tomorrow.  CSW remains available to assist as needed and CSW requested f/u with Neuropsychologist next week.

## 2017-10-07 ENCOUNTER — Inpatient Hospital Stay (HOSPITAL_COMMUNITY): Payer: Medicare Other | Admitting: Physical Therapy

## 2017-10-07 ENCOUNTER — Inpatient Hospital Stay (HOSPITAL_COMMUNITY): Payer: Medicare Other | Admitting: Occupational Therapy

## 2017-10-07 ENCOUNTER — Inpatient Hospital Stay (HOSPITAL_COMMUNITY): Payer: Medicare Other | Admitting: Speech Pathology

## 2017-10-07 DIAGNOSIS — Z515 Encounter for palliative care: Secondary | ICD-10-CM

## 2017-10-07 DIAGNOSIS — R1310 Dysphagia, unspecified: Secondary | ICD-10-CM

## 2017-10-07 DIAGNOSIS — Z7189 Other specified counseling: Secondary | ICD-10-CM

## 2017-10-07 NOTE — Progress Notes (Signed)
Physical Therapy Session Note  Patient Details  Name: Walter Bryant MRN: [id number] Date of Birth: 09-19-1930  Today's Date: 10/07/2017 PT Individual Time: 0930-1000 PT Individual Time Calculation (min): 30 min   Short Term Goals: Week 3:  PT Short Term Goal 1 (Week 3): STG = LTG due to estimated d/c date.   Skilled Therapeutic Interventions/Progress Updates:    Pt received seated EOB with daughter present, propped up with pillows to prevent posterior lean. Pt appears more alert and energetic this AM. No complaints of pain. Sliding board transfer w/c to bed with assist x 2, improved participation and assist from pt with transfer. Sit to stand with standing frame. Pt tolerates standing x 3 min with improved upright posture with decreased cues needed. Pt exhibits some lean to the R but is able to correct with verbal and manual cues. Pt left reclined in TIS w/c in room with family present.  Therapy Documentation Precautions:  Precautions Precautions: Fall, Other (comment) Precaution Comments: R hemi, lateral lean and pushes to the R; Cortrak Required Braces or Orthoses: Other Brace/Splint Other Brace/Splint: bilateral upright AFO's (used PTA. Has h/o charcot foot on L) Restrictions Weight Bearing Restrictions: No  See Function Navigator for Current Functional Status.   Therapy/Group: Individual Therapy  Excell Seltzer, PT, DPT  10/07/2017, 11:12 AM

## 2017-10-07 NOTE — Progress Notes (Signed)
Daily Progress Note   Patient Name: Walter Bryant       Date: 10/07/2017 DOB: Aug 03, 1930  Age: 84 y.o. MRN#: [id number] Attending Physician: Charlett Blake, MD Primary Care Physician: Haywood Pao, MD Admit Date: 09/19/2017  Reason for Consultation/Follow-up: Establishing goals of care and Psychosocial/spiritual support  Subjective: Asked to see patient for follow-up visit by clinical social worker and per family request.  Patient is progressing after his stroke.  He is sitting up in a wheelchair, speech clear.  Alert and oriented x4 Family has multiple questions about what they can anticipate going forward.  They are hopeful that he can go back to [facility] for further rehab where he was living prior to the stroke. Concerns addressed: Diet modifications; what to expect from rehab [facility] burn; Foley catheter versus in and out cath (patient leaning towards wanting a Foley catheter upon discharge, at least initially; he is aware of the risks with associated infection).  Patient was able to tell family who have been quite concerned about oral intake that he would not want a PEG tube No changes to CODE STATUS    Length of Stay: 18  Current Medications: Scheduled Meds:  . apixaban  5 mg Oral BID  . aspirin EC  81 mg Oral Daily  . bethanechol  25 mg Oral TID  . calcium-vitamin D  1 tablet Oral Q breakfast  . chlorhexidine  15 mL Mouth Rinse BID  . feeding supplement (ENSURE ENLIVE)  237 mL Oral TID BM  . free water  200 mL Per Tube TID  . mouth rinse  15 mL Mouth Rinse q12n4p  . methylphenidate  10 mg Oral BID WC  . mirtazapine  30 mg Oral QHS  . multivitamin with minerals  1 tablet Oral Daily  . pantoprazole  40 mg Oral Daily  . senna-docusate  2 tablet Oral BID  . simvastatin   20 mg Oral Daily  . tamsulosin  0.8 mg Oral QPC supper    Continuous Infusions:   PRN Meds: acetaminophen **OR** acetaminophen (TYLENOL) oral liquid 160 mg/5 mL **OR** acetaminophen, ondansetron, polyethylene glycol, QUEtiapine, RESOURCE THICKENUP CLEAR  Physical Exam  Constitutional: He appears well-developed and well-nourished.  HENT:  Head: Normocephalic and atraumatic.  Neck: Normal range of motion.  Cardiovascular: Normal rate.  Pulmonary/Chest: Effort  normal.  Musculoskeletal:  Right hemiparesis wearing LE braces  Neurological:  Right hemiparesis  Skin: Skin is warm and dry.  Psychiatric: He has a normal mood and affect. His behavior is normal. Judgment and thought content normal.  Nursing note and vitals reviewed.           Vital Signs: BP (!) 145/64 (BP Location: Left Arm)   Pulse 74   Temp 98 F (36.7 C) (Oral)   Resp 18   Ht 6\' 2"  (1.88 m)   Wt 98.1 kg (216 lb 4.3 oz)   SpO2 99%   BMI 27.77 kg/m  SpO2: SpO2: 99 % O2 Device: O2 Device: Room Air O2 Flow Rate:    Intake/output summary:   Intake/Output Summary (Last 24 hours) at 10/07/2017 1141 Last data filed at 10/07/2017 0900 Gross per 24 hour  Intake 1000 ml  Output 2025 ml  Net -1025 ml   LBM: Last BM Date: 10/05/17 Baseline Weight: Weight: 98.1 kg (216 lb 4.3 oz) Most recent weight: Weight: 98.1 kg (216 lb 4.3 oz)       Palliative Assessment/Data:      Patient Active Problem List   Diagnosis Date Noted  . Basal ganglia stroke ([place]) 09/19/2017  . Dysphagia, post-stroke   . PAF (paroxysmal atrial fibrillation) ([place])   . Hyperlipidemia   . Goals of care, counseling/discussion   . Hiccoughs   . Palliative care by specialist   . Acute ischemic stroke ([place]) 09/14/2017  . HTN (hypertension) 09/14/2017  . A-fib ([place]) 09/14/2017  . Hyponatremia 08/17/2017  . Fluid overload 08/16/2017  . Anxiety state 08/16/2017  . CAP (community acquired pneumonia) 08/15/2017  . Polyneuropathy in other  diseases classified elsewhere ([place]) 12/20/2013  . Abnormality of gait 12/20/2013  . Non-healing right groin open wound 04/09/2013    Palliative Care Assessment & Plan   Patient Profile: 84 y.o. male  with past medical history of A. fib, hypertension, hyperlipidemia, AAA, BPH admitted on 09/14/2017 with altered mental status.  Patient was found to have an acute left basal ganglia, right frontal, right posterior CVA; moderate cerebral atrophy.  Consult ordered for goals of care.   Patient has been admitted to CIR and undergoing rehab.  He is experiencing urinary retention requiring in and out caths, core track for additional nutritional needs (which has been removed today), and hopeful to return to [facility] skilled nursing facility where he resided prior to this admission for further rehab    Recommendations/Plan:  Patient remains full code, full scope of treatment  Shared with family that he would not want a permanent feeding tube should his dysphasia worsen  Discharge to [facility] skilled nursing facility for further rehab as soon is medically maximized which he hopes will be this coming week  Patient is struggling with the in and out cath procedure and may want to have a Foley catheter prior to discharge until he acclimates to new staff.  Dr. Jossie Ng, is a retired physician, and is able to articulate risks and benefits, principally increased risk for urinary tract infection with a Foley catheter  Patient and family may benefit from ongoing palliative support in the community.  The services can be accessed by contacting, [facility] at [phone]; or [facility] palliative medicine division, [phone]  Goals of Care and Additional Recommendations:  Limitations on Scope of Treatment: Full Scope Treatment  Code Status:    Code Status Orders  (From admission, onward)  Start     Ordered   09/19/17 1847  Full code  Continuous       09/19/17 1846    Code Status History    Date Active Date Inactive Code Status Order ID Comments User Context   09/19/2017 1847 09/19/2017 1847 Full Code [id number]  Elizabeth Sauer Inpatient   09/14/2017 0539 09/19/2017 1845 Full Code [id number]  Etta Quill, DO ED   08/15/2017 1642 08/17/2017 2019 Full Code [id number]  Hosie Poisson, MD Inpatient    Advance Directive Documentation     Most Recent Value  Type of Advance Directive  Healthcare Power of Attorney  Pre-existing out of facility DNR order (yellow form or pink MOST form)  -  "MOST" Form in Place?  -       Prognosis:   Unable to determine  Discharge Planning:  [place] for rehab with Palliative care service follow-up   Thank you for allowing the Palliative Medicine Team to assist in the care of this patient.   Time In: 1000 Time Out: 1035 Total Time 35 min Prolonged Time Billed  no       Greater than 50%  of this time was spent counseling and coordinating care related to the above assessment and plan.  Dory Horn, NP  Please contact Palliative Medicine Team phone at [phone] for questions and concerns.

## 2017-10-07 NOTE — Plan of Care (Signed)
  Problem: Consults Goal: RH STROKE PATIENT EDUCATION Description See Patient Education module for education specifics  Outcome: Progressing Goal: Nutrition Consult-if indicated Outcome: Progressing   Problem: RH BOWEL ELIMINATION Goal: RH STG MANAGE BOWEL WITH ASSISTANCE Description STG Manage Bowel with moderate Assistance.  Outcome: Progressing Goal: RH STG MANAGE BOWEL W/MEDICATION W/ASSISTANCE Description STG Manage Bowel with Medication with min Assistance.  Outcome: Progressing   Problem: RH BLADDER ELIMINATION Goal: RH STG MANAGE BLADDER WITH ASSISTANCE Description STG Manage Bladder With moderate Assistance  Outcome: Progressing Goal: RH STG MANAGE BLADDER WITH EQUIPMENT WITH ASSISTANCE Description STG Manage Bladder With Equipment and With total Assistance  Outcome: Progressing   Problem: RH SKIN INTEGRITY Goal: RH STG SKIN FREE OF INFECTION/BREAKDOWN Description Skin to remain free from breakdown with min assistance while controlling incontinence by toileting patient every 4 hours or more frequently as needed by patient.  Outcome: Progressing Goal: RH STG MAINTAIN SKIN INTEGRITY WITH ASSISTANCE Description STG Maintain Skin Integrity With moderate Assistance.  Outcome: Progressing Goal: RH STG ABLE TO PERFORM INCISION/WOUND CARE W/ASSISTANCE Description STG Able To Perform skin Care With moderate Assistance.  Outcome: Progressing   Problem: RH SAFETY Goal: RH STG ADHERE TO SAFETY PRECAUTIONS W/ASSISTANCE/DEVICE Description STG Adhere to Safety Precautions With moderate Assistance and appropriate assistive Devices.  Outcome: Progressing Goal: RH STG DECREASED RISK OF FALL WITH ASSISTANCE Description STG Decreased Risk of Fall With moderate Assistance while following CIR's new standard toileting protocol of every 4 hours.  Outcome: Progressing   Problem: RH PAIN MANAGEMENT Goal: RH STG PAIN MANAGED AT OR BELOW PT'S PAIN GOAL Description < 3 on a 0-10  pain scale  Outcome: Progressing   Problem: RH KNOWLEDGE DEFICIT Goal: RH STG INCREASE KNOWLEDGE OF HYPERTENSION Description Patient and spouse will be able to demonstrate knowledge of new HTN medications, including dosage amounts, dosages per day, and follow-up care with the MD post discharge with min assist from rehab staff.  Outcome: Progressing Goal: RH STG INCREASE KNOWLEDGE OF DYSPHAGIA/FLUID INTAKE Description Patient and spouse will be able to demonstrate knowledge of safe swallowing techniques, and proper techniques for thickening liquids with min assist from rehab staff.  Outcome: Progressing

## 2017-10-07 NOTE — Progress Notes (Signed)
Speech Language Pathology Daily Session Note  Patient Details  Name: Walter Bryant MRN: [id number] Date of Birth: 1930-11-24  Today's Date: 10/07/2017 SLP Individual Time: 1430-1500 SLP Individual Time Calculation (min): 30 min  Short Term Goals: Week 3: SLP Short Term Goal 1 (Week 3): Pt will consume dys 3 textures and thin liquids with min cues for use of chin tuck. SLP Short Term Goal 1 - Progress (Week 3): Updated due to goal met SLP Short Term Goal 2 (Week 3): Pt will recall new, daily information with Min A verbal cues for external aids. SLP Short Term Goal 3 (Week 3): Pt will demonstrate 70% intelligibility at sentence level with Mod A verbal cues for use of speech intelligibility strategies.  SLP Short Term Goal 4 (Week 3): Pt will demonstrate sustained attention to functional tasks for 45 minutes with Min A verbal cues for redirection. SLP Short Term Goal 5 (Week 3): Pt will demonstrate functional problem solving in basic and familiar tasks with Min A verbal cues.  Skilled Therapeutic Interventions: Skilled treatment session focused on cognition and dysphagia. SLP with attempt to get out of bed unassisted. SLP provided education to pt's grandson and pt that he was unsafe to sit edge of bed. Pt unable to demonstrate retention of information d/t perseveration on spirometry (blowing vs. Sucking). Despite Max A cues, pt had difficulty redirecting. Pt required Min A cues to use chin tuck with consumption of thin liquids. Cough x 1 with 5 sips. Pt was left supine in bed, bed alarm on and all needs within reach, 4 bed rails raised and grandson present.      Function:  Eating Eating   Modified Consistency Diet: No Eating Assist Level: Set up assist for;Supervision or verbal cues   Eating Set Up Assist For: Opening containers Helper Scoops Food on Utensil: Occasionally     Cognition Comprehension Comprehension assist level: Understands basic 75 - 89% of the time/ requires cueing 10 -  24% of the time  Expression   Expression assist level: Expresses basic 75 - 89% of the time/requires cueing 10 - 24% of the time. Needs helper to occlude trach/needs to repeat words.  Social Interaction Social Interaction assist level: Interacts appropriately 75 - 89% of the time - Needs redirection for appropriate language or to initiate interaction.  Problem Solving Problem solving assist level: Solves basic 25 - 49% of the time - needs direction more than half the time to initiate, plan or complete simple activities  Memory Memory assist level: Recognizes or recalls 25 - 49% of the time/requires cueing 50 - 75% of the time    Pain    Therapy/Group: Individual Therapy  Lakiesha Ralphs 10/07/2017, 3:23 PM

## 2017-10-07 NOTE — Progress Notes (Signed)
Patient slept some during the night but not a great deal. He was very focused on the staff completing his in/out cath's and was also confused at times during the night.

## 2017-10-07 NOTE — NC FL2 (Signed)
[place] LEVEL OF CARE SCREENING TOOL     IDENTIFICATION  Patient Name: Walter Bryant Birthdate: 01/15/1931 Sex: male Admission Date (Current Location): 09/19/2017  [facility] and [place] Number:  Herbalist and Address:  [facility], [place] [address], [place], [place] [address]      Provider Number: [id number]  Attending Physician Name and Address:  Charlett Blake, MD  Relative Name and Phone Number:  Dayan Desa [phone]    Current Level of Care: Other (Comment)(rehab) Recommended Level of Care: [place] Prior Approval Number:    Date Approved/Denied:   PASRR Number: [id number]  Discharge Plan: SNF    Current Diagnoses: Patient Active Problem List   Diagnosis Date Noted  . Basal ganglia stroke ([place]) 09/19/2017  . Dysphagia   . PAF (paroxysmal atrial fibrillation) ([place])   . Hyperlipidemia   . Goals of care, counseling/discussion   . Hiccoughs   . Palliative care by specialist   . Acute ischemic stroke ([place]) 09/14/2017  . HTN (hypertension) 09/14/2017  . A-fib ([place]) 09/14/2017  . Hyponatremia 08/17/2017  . Fluid overload 08/16/2017  . Anxiety state 08/16/2017  . CAP (community acquired pneumonia) 08/15/2017  . Polyneuropathy in other diseases classified elsewhere ([place]) 12/20/2013  . Abnormality of gait 12/20/2013  . Non-healing right groin open wound 04/09/2013    Orientation RESPIRATION BLADDER Height & Weight     Self, Time, Situation, Place  Normal External catheter(I & O cath) Weight: 216 lb 4.3 oz (98.1 kg) Height:  6\' 2"  (188 cm)  BEHAVIORAL SYMPTOMS/MOOD NEUROLOGICAL BOWEL NUTRITION STATUS      Continent Diet(Dys 3 thin liquids)  AMBULATORY STATUS COMMUNICATION OF NEEDS Skin   Extensive Assist Verbally Normal                       Personal Care Assistance Level of Assistance  Bathing, Dressing Bathing Assistance: Maximum assistance   Dressing Assistance:  Maximum assistance     Functional Limitations Info             SPECIAL CARE FACTORS FREQUENCY  PT (By licensed PT), OT (By licensed OT), Speech therapy, Bowel and bladder program     PT Frequency: 5x week OT Frequency: 5x week Bowel and Bladder Program Frequency: Bladder continence-was I & O cath still deciding about foley being palced prior to transfer to [facility] Therapy Frequency: 5x week      Contractures Contractures Info: Not present    Additional Factors Info  Code Status, Allergies Code Status Info: Full Code Allergies Info: Morphine, Oxycodone, Xanax, etc           Current Medications (10/07/2017):  This is the current hospital active medication list Current Facility-Administered Medications  Medication Dose Route Frequency Provider Last Rate Last Dose  . acetaminophen (TYLENOL) tablet 650 mg  650 mg Oral Q4H PRN Cathlyn Parsons, PA-C   650 mg at 10/03/17 1958   Or  . acetaminophen (TYLENOL) solution 650 mg  650 mg Per Tube Q4H PRN Cathlyn Parsons, PA-C   650 mg at 09/27/17 0957   Or  . acetaminophen (TYLENOL) suppository 650 mg  650 mg Rectal Q4H PRN Angiulli, Lavon Paganini, PA-C      . apixaban (ELIQUIS) tablet 5 mg  5 mg Oral BID AngiulliLavon Paganini, PA-C   5 mg at 10/07/17 1017  . aspirin EC tablet 81 mg  81 mg Oral Daily Angiulli,  Lavon Paganini, PA-C   81 mg at 10/07/17 0903  . bethanechol (URECHOLINE) tablet 25 mg  25 mg Oral TID Charlett Blake, MD   25 mg at 10/07/17 0903  . calcium-vitamin D (OSCAL WITH D) 500-200 MG-UNIT per tablet 1 tablet  1 tablet Oral Q breakfast Cathlyn Parsons, PA-C   1 tablet at 10/07/17 2878  . chlorhexidine (PERIDEX) 0.12 % solution 15 mL  15 mL Mouth Rinse BID Charlett Blake, MD   15 mL at 10/07/17 0902  . feeding supplement (ENSURE ENLIVE) (ENSURE ENLIVE) liquid 237 mL  237 mL Oral TID BM Kirsteins, Luanna Salk, MD   237 mL at 10/07/17 1014  . free water 200 mL  200 mL Per Tube TID Charlett Blake, MD   200  mL at 10/06/17 1740  . MEDLINE mouth rinse  15 mL Mouth Rinse q12n4p Kirsteins, Luanna Salk, MD   15 mL at 10/06/17 1245  . methylphenidate (RITALIN) tablet 10 mg  10 mg Oral BID WC Kirsteins, Luanna Salk, MD   10 mg at 10/07/17 0905  . mirtazapine (REMERON SOL-TAB) disintegrating tablet 30 mg  30 mg Oral QHS Charlett Blake, MD   30 mg at 10/06/17 2052  . multivitamin with minerals tablet 1 tablet  1 tablet Oral Daily Cathlyn Parsons, PA-C   1 tablet at 10/07/17 6767  . ondansetron (ZOFRAN) 4 MG/5ML solution 4 mg  4 mg Oral Q8H PRN Charlett Blake, MD   4 mg at 10/07/17 0912  . pantoprazole (PROTONIX) EC tablet 40 mg  40 mg Oral Daily Charlett Blake, MD   40 mg at 10/07/17 0904  . polyethylene glycol (MIRALAX / GLYCOLAX) packet 17 g  17 g Oral Daily PRN Meredith Staggers, MD      . QUEtiapine (SEROQUEL) tablet 25 mg  25 mg Oral QHS PRN Lisabeth Pick, MD   25 mg at 10/06/17 2232  . RESOURCE THICKENUP CLEAR   Oral PRN Angiulli, Lavon Paganini, PA-C      . senna-docusate (Senokot-S) tablet 2 tablet  2 tablet Oral BID Charlett Blake, MD   2 tablet at 10/07/17 [phone]  . simvastatin (ZOCOR) tablet 20 mg  20 mg Oral Daily Cathlyn Parsons, PA-C   20 mg at 10/07/17 7096  . tamsulosin (FLOMAX) capsule 0.8 mg  0.8 mg Oral QPC supper Charlett Blake, MD   0.8 mg at 10/06/17 1740     Discharge Medications: Please see discharge summary for a list of discharge medications.  Relevant Imaging Results:  Relevant Lab Results:   Additional Information [id number]  Rethel Sebek, Gardiner Rhyme, LCSW

## 2017-10-07 NOTE — Progress Notes (Signed)
Occupational Therapy Session Note  Patient Details  Name: DEMONTAE ANTUNES MRN: [id number] Date of Birth: [birth year]  Today's Date: 10/07/2017 OT Individual Time: 1300-1330 OT Individual Time Calculation (min): 30 min    Short Term Goals: Week 3:  OT Short Term Goal 1 (Week 3): STG=LTG due to LOS  Skilled Therapeutic Interventions/Progress Updates:    Pt seen for OT session focusing on functional transfers, functional sitting balance, and functional activity tolerance. Pt sitting up in tilt-in-space w/c upon arrival, voicing increased fatigue/ lethargy. RN reporting pt having been up in chair since 9AM PT session. Pt agreeable to getting back to bed. Completed sliding board transfer to pt's stronger L side. Able to complete with max A +1 with +2 to stabilize equipment. Once pt able to reach bed rail and assist by pulling, completed transfer with mod A. Pt with much improved anterior weight shift this session to assist with effective head/hip relationship during transfer.  Pt able to sit EOB with supervision, completing anterior weight shift to reach forward to untie shoes, demonstrating much improved dynamic sitting balance and endurance, completing with min A. Pt tolerated sitting EOB ~10-12 minutes with supervision a great improvement from previus session. Pt returned to supine at end of session, mod A for management of B LEs. Pt left in supine at end of session, bed alarm on with family member present.   Therapy Documentation Precautions:  Precautions Precautions: Fall, Other (comment) Precaution Comments: R hemi, lateral lean and pushes to the R; Cortrak Required Braces or Orthoses: Other Brace/Splint Other Brace/Splint: bilateral upright AFO's (used PTA. 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   Therapy/Group: Individual  Therapy  Lacara Dunsworth L 10/07/2017, 6:49 AM

## 2017-10-07 NOTE — Progress Notes (Signed)
Subjective/Complaints:  Eating >50% meals, able to take pills po Fluid intake 1120 ml yest Pt would like feeding tube removed  ROS: Limited due to cognitive/behavioral , no pains no breathing problems , no nausea or vomiting,   Objective: Vital Signs: Blood pressure (!) 145/64, pulse 74, temperature 98 F (36.7 C), temperature source Oral, resp. rate 18, height 6' 2"  (1.88 m), weight 98.1 kg (216 lb 4.3 oz), SpO2 99 %. No results found. Results for orders placed or performed during the hospital encounter of 09/19/17 (from the past 72 hour(s))  CBC     Status: None   Collection Time: 10/06/17  6:42 AM  Result Value Ref Range   WBC 9.1 4.0 - 10.5 K/uL   RBC 4.93 4.22 - 5.81 MIL/uL   Hemoglobin 15.7 13.0 - 17.0 g/dL   HCT 46.9 39.0 - 52.0 %   MCV 95.1 78.0 - 100.0 fL   MCH 31.8 26.0 - 34.0 pg   MCHC 33.5 30.0 - 36.0 g/dL   RDW 14.3 11.5 - 15.5 %   Platelets 249 150 - 400 K/uL    Comment: Performed at [facility], [place] [address], [place], [place] [address]  Creatinine, serum     Status: None   Collection Time: 10/06/17  6:42 AM  Result Value Ref Range   Creatinine, Ser 1.04 0.61 - 1.24 mg/dL   GFR calc non Af Amer >60 >60 mL/min   GFR calc Af Amer >60 >60 mL/min    Comment: (NOTE) The eGFR has been calculated using the CKD EPI equation. This calculation has not been validated in all clinical situations. eGFR's persistently <60 mL/min signify possible Chronic Kidney Disease. Performed at [facility], [place] [address], [place], [place] [address]      Constitutional: No distress . Vital signs reviewed. HEENT: EOMI, oral membranes moist Neck: supple Cardiovascular: RRR without murmur. No JVD    Respiratory: CTA Bilaterally without wheezes or rales. Normal effort    GI: BS +, non-tender, non-distended  Skin:   Other IV sites and nares CDI Neuro:  Alert, follows simple commands.  Decreased insight and awareness., Abnormal Sensory Reduced bilateral below ankles,  Abnormal Motor 3-/5 RUE grip, 2- RUE Bicepts and Triceps and 2- RLE hip/knee extensor synergy as well as plantarflexion.  Moderately dysarthric Musc/Skel:  Bilateral AFOs Psych: Remains restless/anxious  Assessment/Plan: 1. 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 Physiatrist is providing close team supervision and 24 hour management of active medical problems listed below. Physiatrist and rehab team continue to assess barriers to discharge/monitor patient progress toward functional and medical goals. FIM: Function - Bathing Position: Bed Body parts bathed by helper: Right arm, Left arm, Chest, Abdomen, Front perineal area, Buttocks, Right upper leg, Left upper leg, Right lower leg, Left lower leg, Back Assist Level: 2 helpers  Function- Upper Body Dressing/Undressing What is the patient wearing?: Pull over shirt/dress Pull over shirt/dress - Perfomed by patient: Thread/unthread left sleeve, Put head through opening Pull over shirt/dress - Perfomed by helper: Thread/unthread right sleeve, Put head through opening, Pull shirt over trunk Assist Level: More than reasonable time, Touching or steadying assistance(Pt > 75%) Function - Lower Body Dressing/Undressing What is the patient wearing?: Pants, AFO, Shoes, Socks Position: Bed Pants- Performed by helper: Thread/unthread right pants leg, Thread/unthread left pants leg, Pull pants up/down(family performed task) Socks - Performed by helper: Don/doff right  sock, Don/doff left sock(family performed task) Shoes - Performed by helper: Don/doff right shoe, Don/doff left shoe, Fasten right, Fasten left(family performed task) AFO - Performed by helper: Don/doff right AFO, Don/doff left AFO TED Hose - Performed by helper: Don/doff right TED hose, Don/doff left TED hose Assist for footwear: Maximal assist Assist for  lower body dressing: 2 Helpers  Function - Toileting Toileting activity did not occur: Safety/medical concerns Toileting steps completed by helper: Adjust clothing prior to toileting, Adjust clothing after toileting, Performs perineal hygiene Toileting Assistive Devices: Other (comment)(Using [employer]us) Assist level: Two helpers  Function - Air cabin crew transfer activity did not occur: Safety/medical concerns Toilet transfer assistive device: Facilities manager lift: Garment/textile technologist level to toilet: 2 helpers Assist level from toilet: 2 helpers  Function - Chair/bed transfer Chair/bed transfer [place]: Lateral scoot Chair/bed transfer assist level: 2 helpers Chair/bed transfer assistive device: Sliding board Mechanical lift: [place] Chair/bed transfer details: Tactile cues for initiation, Tactile cues for sequencing, Tactile cues for weight shifting, Tactile cues for posture, Tactile cues for placement, Verbal cues for sequencing, Verbal cues for technique, Manual facilitation for placement, Manual facilitation for weight shifting  Function - Locomotion: Wheelchair Will patient use wheelchair at discharge?: Yes Type: Manual Max wheelchair distance: 90 ft  Assist Level: Supervision or verbal cues Assist Level: Supervision or verbal cues Assist Level: Dependent (Pt equals 0%) Turns around,maneuvers to table,bed, and toilet,negotiates 3% grade,maneuvers on rugs and over doorsills: No Function - Locomotion: Ambulation Ambulation activity did not occur: Safety/medical concerns Assistive device: Rail in hallway Max distance: 2 Assist level: 2 helpers Walk 10 feet activity did not occur: Safety/medical concerns Walk 50 feet with 2 turns activity did not occur: Safety/medical concerns Walk 150 feet activity did not occur: Safety/medical concerns Walk 10 feet on uneven surfaces activity did not occur: Safety/medical concerns  Function - Comprehension Comprehension:  Auditory Comprehension assistive device: Hearing aids Comprehension assist level: Understands basic 75 - 89% of the time/ requires cueing 10 - 24% of the time  Function - Expression Expression: Verbal Expression assist level: Expresses basic 75 - 89% of the time/requires cueing 10 - 24% of the time. Needs helper to occlude trach/needs to repeat words.  Function - Social Interaction Social Interaction assist level: Interacts appropriately 75 - 89% of the time - Needs redirection for appropriate language or to initiate interaction.  Function - Problem Solving Problem solving assist level: Solves basic 25 - 49% of the time - needs direction more than half the time to initiate, plan or complete simple activities  Function - Memory Memory assistive device: Memory book Memory assist level: Recognizes or recalls 50 - 74% of the time/requires cueing 25 - 49% of the time Patient normally able to recall (first 3 days only): That he or she is in a hospital  Medical Problem List and Plan: 1.  Right side weakness with dysarthria secondary to multiple bilateral infarcts (left BG, tiny Right frontal and bilateral  occipital infarcts) consistent with cardiac emboli with history of atrial fibrillation.Onset 5/29 See Palliative note- will proceed with SNF    Motor strength improving in RUE,discussed progress with family  Continue CIR PT OT speech, 2.  DVT Prophylaxis/Anticoagulation: Eliquis 3. Pain Management/polyneuropathy: Tylenol as needed 4. Mood/sleep/confusion: Patient is quite happy with the Remeron.  Slept ~5hr last noc    sleep graph 5. Neuropsych: This patient is capable of making decisions on his own behalf. 6. Skin/Wound Care: Routine skin checks 7. Fluids/Electrolytes/Nutrition: BMET normal 6/4  -  Appreciate dietary consult  8.  Dysphasia. D/C Cortrak tubeCheck calorie counts.    Repeat modified this week as per speech therapy    -trial Megace ongoing with sluggish results so far. With  AMS above, will go ahead and hold for now as well   -H20 boluses 9.  History of atrial fibrillation.  Continue Eliquis 75m BID.  Cardiac rate controlled, on metoprolol PTA 10.  Hyperlipidemia.  Zocor on lipitor PTA 11.  History of polyneuropathy.  Patient uses bilateral AFO braces prior to admission.  No up with her deficits however 12.  Hx HTN lasix and Lisinopril PTA Vitals:   10/06/17 1929 10/07/17 0421  BP: 130/75 (!) 145/64  Pulse: 75 74  Resp: 18 18  Temp: 99 F (37.2 C) 98 F (36.7 C)  SpO2: 97% 99%  BPs controlled 6/21 13.  Hx of GERD-  Pepcid to 451mBID, added protonix 4050maily, improved 14.  Nausea without vomiting, question medication versus tube feeding versus constipation.  Appears to have improved partially 15.  Constipation improving per pt 16.  Urinary retention  likely multifactorial immobility and prob BPH,   flomax increased to home dose 0.8mg20mdded urecholine will titrate up to 25mg73m  -Urine culture positive enterococcus  On Amoxil Will continue in and out catheterizations, he would likely require less catheterization at night without the nocturnal tube feeds. Would plan to discharge home with Foley if patient still requiring intermittent catheterizations 17.  sleep wake cycle disruption, improved sleep last noc   increased Remeron, increase Ritalin breakfast and lunch 18.  Overall patient has had some neurologic improvements however the effort of intensive inpatient rehabilitation is too much for him and he will have a goals of care meeting with the palliative medicine team on Friday. Have reduced therapy scheduled to daily for each discipline Daughter who is [phone]-LOS (Days) 18 A [place]steins 10/07/2017, 1:11 PM

## 2017-10-08 ENCOUNTER — Inpatient Hospital Stay (HOSPITAL_COMMUNITY): Payer: Medicare Other

## 2017-10-08 MED ORDER — METHYLPHENIDATE HCL 5 MG PO TABS
5.0000 mg | ORAL_TABLET | Freq: Two times a day (BID) | ORAL | Status: DC
  Administered 2017-10-08 – 2017-10-09 (×2): 5 mg via ORAL
  Filled 2017-10-08 (×2): qty 1

## 2017-10-08 NOTE — Plan of Care (Signed)
  Problem: Consults Goal: RH STROKE PATIENT EDUCATION Description See Patient Education module for education specifics  Outcome: Progressing Goal: Nutrition Consult-if indicated Outcome: Progressing   Problem: RH BOWEL ELIMINATION Goal: RH STG MANAGE BOWEL WITH ASSISTANCE Description STG Manage Bowel with moderate Assistance.  Outcome: Progressing Goal: RH STG MANAGE BOWEL W/MEDICATION W/ASSISTANCE Description STG Manage Bowel with Medication with min Assistance.  Outcome: Progressing   Problem: RH BLADDER ELIMINATION Goal: RH STG MANAGE BLADDER WITH ASSISTANCE Description STG Manage Bladder With moderate Assistance  Outcome: Progressing Goal: RH STG MANAGE BLADDER WITH EQUIPMENT WITH ASSISTANCE Description STG Manage Bladder With Equipment and With total Assistance  Outcome: Progressing   Problem: RH SKIN INTEGRITY Goal: RH STG SKIN FREE OF INFECTION/BREAKDOWN Description Skin to remain free from breakdown with min assistance while controlling incontinence by toileting patient every 4 hours or more frequently as needed by patient.  Outcome: Progressing Goal: RH STG MAINTAIN SKIN INTEGRITY WITH ASSISTANCE Description STG Maintain Skin Integrity With moderate Assistance.  Outcome: Progressing Goal: RH STG ABLE TO PERFORM INCISION/WOUND CARE W/ASSISTANCE Description STG Able To Perform skin Care With moderate Assistance.  Outcome: Progressing   Problem: RH SAFETY Goal: RH STG ADHERE TO SAFETY PRECAUTIONS W/ASSISTANCE/DEVICE Description STG Adhere to Safety Precautions With moderate Assistance and appropriate assistive Devices.  Outcome: Progressing Goal: RH STG DECREASED RISK OF FALL WITH ASSISTANCE Description STG Decreased Risk of Fall With moderate Assistance while following CIR's new standard toileting protocol of every 4 hours.  Outcome: Progressing   Problem: RH PAIN MANAGEMENT Goal: RH STG PAIN MANAGED AT OR BELOW PT'S PAIN GOAL Description < 3 on a 0-10  pain scale  Outcome: Progressing   Problem: RH KNOWLEDGE DEFICIT Goal: RH STG INCREASE KNOWLEDGE OF HYPERTENSION Description Patient and spouse will be able to demonstrate knowledge of new HTN medications, including dosage amounts, dosages per day, and follow-up care with the MD post discharge with min assist from rehab staff.  Outcome: Progressing Goal: RH STG INCREASE KNOWLEDGE OF DYSPHAGIA/FLUID INTAKE Description Patient and spouse will be able to demonstrate knowledge of safe swallowing techniques, and proper techniques for thickening liquids with min assist from rehab staff.  Outcome: Progressing

## 2017-10-08 NOTE — Progress Notes (Signed)
Subjective/Complaints:  Pt awoke requesting I/O cath  ROS: Limited due to cognitive/behavioral , no pains no breathing problems , no nausea or vomiting,   Objective: Vital Signs: Blood pressure (!) 154/83, pulse 90, temperature 98.5 F (36.9 C), temperature source Oral, resp. rate 18, height 6' 2"  (1.88 m), weight 98.1 kg (216 lb 4.3 oz), SpO2 95 %. No results found. Results for orders placed or performed during the hospital encounter of 09/19/17 (from the past 72 hour(s))  CBC     Status: None   Collection Time: 10/06/17  6:42 AM  Result Value Ref Range   WBC 9.1 4.0 - 10.5 K/uL   RBC 4.93 4.22 - 5.81 MIL/uL   Hemoglobin 15.7 13.0 - 17.0 g/dL   HCT 46.9 39.0 - 52.0 %   MCV 95.1 78.0 - 100.0 fL   MCH 31.8 26.0 - 34.0 pg   MCHC 33.5 30.0 - 36.0 g/dL   RDW 14.3 11.5 - 15.5 %   Platelets 249 150 - 400 K/uL    Comment: Performed at [facility], [place] [address], [place], [place] [address]  Creatinine, serum     Status: None   Collection Time: 10/06/17  6:42 AM  Result Value Ref Range   Creatinine, Ser 1.04 0.61 - 1.24 mg/dL   GFR calc non Af Amer >60 >60 mL/min   GFR calc Af Amer >60 >60 mL/min    Comment: (NOTE) The eGFR has been calculated using the CKD EPI equation. This calculation has not been validated in all clinical situations. eGFR's persistently <60 mL/min signify possible Chronic Kidney Disease. Performed at [facility], [place] [address] [place], [place] [address]      Constitutional: No distress . Vital signs reviewed. HEENT: EOMI, oral membranes moist Neck: supple Cardiovascular: RRR without murmur. No JVD    Respiratory: CTA Bilaterally without wheezes or rales. Normal effort    GI: BS +, non-tender, non-distended  Skin:   Other IV sites and nares CDI Neuro:  Alert, follows simple commands.  Decreased insight and awareness., Abnormal Sensory Reduced bilateral below ankles, Abnormal Motor 3-/5 RUE grip, 2- RUE Bicepts and Triceps and 2- RLE hip/knee  extensor synergy as well as plantarflexion.  Moderately dysarthric Musc/Skel:  Bilateral AFOs Psych: Remains restless/anxious  Assessment/Plan: 1. 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 Physiatrist is providing close team supervision and 24 hour management of active medical problems listed below. Physiatrist and rehab team continue to assess barriers to discharge/monitor patient progress toward functional and medical goals. FIM: Function - Bathing Position: Bed Body parts bathed by helper: Right arm, Left arm, Chest, Abdomen, Front perineal area, Buttocks, Right upper leg, Left upper leg, Right lower leg, Left lower leg, Back Assist Level: 2 helpers  Function- Upper Body Dressing/Undressing What is the patient wearing?: Pull over shirt/dress Pull over shirt/dress - Perfomed by patient: Thread/unthread left sleeve, Put head through opening Pull over shirt/dress - Perfomed by helper: Thread/unthread right sleeve, Put head through opening, Pull shirt over trunk Assist Level: More than reasonable time, Touching or steadying assistance(Pt > 75%) Function - Lower Body Dressing/Undressing What is the patient wearing?: Pants, AFO, Shoes, Socks Position: Bed Pants- Performed by helper: Thread/unthread right pants leg, Thread/unthread left pants leg, Pull pants up/down(family performed task) Socks - Performed by helper: Don/doff right sock, Don/doff left sock(family performed task) Shoes - Performed by helper: Don/doff right shoe,  Don/doff left shoe, Fasten right, Fasten left(family performed task) AFO - Performed by helper: Don/doff right AFO, Don/doff left AFO TED Hose - Performed by helper: Don/doff right TED hose, Don/doff left TED hose Assist for footwear: Maximal assist Assist for lower body dressing: 2 Helpers  Function - Toileting Toileting activity  did not occur: Safety/medical concerns Toileting steps completed by helper: Adjust clothing prior to toileting, Adjust clothing after toileting, Performs perineal hygiene Toileting Assistive Devices: Other (comment)(Using [employer]) Assist level: Two helpers  Function - Air cabin crew transfer activity did not occur: Safety/medical concerns Toilet transfer assistive device: Facilities manager lift: Garment/textile technologist level to toilet: 2 helpers Assist level from toilet: 2 helpers  Function - Chair/bed transfer Chair/bed transfer [place]: Lateral scoot Chair/bed transfer assist level: 2 helpers Chair/bed transfer assistive device: Sliding board Mechanical lift: [place] Chair/bed transfer details: Tactile cues for initiation, Tactile cues for sequencing, Tactile cues for weight shifting, Tactile cues for posture, Tactile cues for placement, Verbal cues for sequencing, Verbal cues for technique, Manual facilitation for placement, Manual facilitation for weight shifting  Function - Locomotion: Wheelchair Will patient use wheelchair at discharge?: Yes Type: Manual Max wheelchair distance: 90 ft  Assist Level: Supervision or verbal cues Assist Level: Supervision or verbal cues Assist Level: Dependent (Pt equals 0%) Turns around,maneuvers to table,bed, and toilet,negotiates 3% grade,maneuvers on rugs and over doorsills: No Function - Locomotion: Ambulation Ambulation activity did not occur: Safety/medical concerns Assistive device: Rail in hallway Max distance: 2 Assist level: 2 helpers Walk 10 feet activity did not occur: Safety/medical concerns Walk 50 feet with 2 turns activity did not occur: Safety/medical concerns Walk 150 feet activity did not occur: Safety/medical concerns Walk 10 feet on uneven surfaces activity did not occur: Safety/medical concerns  Function - Comprehension Comprehension: Auditory Comprehension assistive device: Hearing aids Comprehension assist  level: Understands basic 75 - 89% of the time/ requires cueing 10 - 24% of the time  Function - Expression Expression: Verbal Expression assist level: Expresses basic 75 - 89% of the time/requires cueing 10 - 24% of the time. Needs helper to occlude trach/needs to repeat words.  Function - Social Interaction Social Interaction assist level: Interacts appropriately 75 - 89% of the time - Needs redirection for appropriate language or to initiate interaction.  Function - Problem Solving Problem solving assist level: Solves basic 25 - 49% of the time - needs direction more than half the time to initiate, plan or complete simple activities  Function - Memory Memory assistive device: Memory book Memory assist level: Recognizes or recalls 25 - 49% of the time/requires cueing 50 - 75% of the time Patient normally able to recall (first 3 days only): That he or she is in a hospital  Medical Problem List and Plan: 1.  Right side weakness with dysarthria secondary to multiple bilateral infarcts (left BG, tiny Right frontal and bilateral  occipital infarcts) consistent with cardiac emboli with history of atrial fibrillation.Onset 5/29 Wean off ritalin See Palliative note- will proceed with SNF    Motor strength improving in RUE,discussed progress with family  Continue CIR PT OT speech, 2.  DVT Prophylaxis/Anticoagulation: Eliquis 3. Pain Management/polyneuropathy: Tylenol as needed 4. Mood/sleep/confusion: Patient is quite happy with the Remeron.  Slept ok last noc    sleep graph 5. Neuropsych: This patient is capable of making decisions on his own behalf. 6. Skin/Wound Care: Routine skin checks 7. Fluids/Electrolytes/Nutrition: BMET normal 6/4  -Appreciate dietary consult  8.  Dysphagia.  Improved with good intake! 9.  History of atrial fibrillation.  Continue Eliquis 67m BID.  Cardiac rate controlled, on metoprolol PTA 10.  Hyperlipidemia.  Zocor on lipitor PTA 11.  History of  polyneuropathy.  Patient uses bilateral AFO braces prior to admission.  No up with her deficits however 12.  Hx HTN lasix and Lisinopril PTA Vitals:   10/07/17 2030 10/08/17 0539  BP: (!) 141/71 (!) 154/83  Pulse: 87 90  Resp: 17 18  Temp: 98.5 F (36.9 C) 98.5 F (36.9 C)  SpO2: 98% 95%  BPs with mildly increased systolic  13.  Hx of GERD-  Pepcid to 43mBID, added protonix 4014maily, improved 14.  Nausea without vomiting, question medication versus tube feeding versus constipation.  Appears to have improved partially 15.  Constipation improving per pt 16.  Urinary retention  likely multifactorial immobility and prob BPH,   flomax increased to home dose 0.8mg2mdded urecholine will titrate up to 25mg19m, cath vol 375ml 12m noc  -Urine culture positive enterococcus  completed Amoxil Will continue in and out catheterizations, he would likely require less catheterization at night without the nocturnal tube feeds. Would plan to discharge home with Foley if patient still requiring intermittent catheterizations- would also stop urecholine 17.  sleep wake cycle disruption, improved sleep last noc   increased Remeron, increase Ritalin breakfast and lunch 18.  Overall patient has had some neurologic improvements however the effort of intensive inpatient rehabilitation is too much for him and he will have a goals of care meeting with the palliative medicine team on Friday. Have reduced therapy scheduled to daily for each discipline Daughter who is [phone]-LOS (Days) 19 A [place]steins 10/08/2017, 11:01 AM

## 2017-10-08 NOTE — Progress Notes (Signed)
Occupational Therapy Session Note  Patient Details  Name: Walter Bryant MRN: [id number] Date of Birth: [birth year]  Today's Date: 10/08/2017 OT Individual Time: 1340-1410 OT Individual Time Calculation (min): 30 min    Short Term Goals: Week 3:  OT Short Term Goal 1 (Week 3): STG=LTG due to LOS Skilled Therapeutic Interventions/Progress Updates:    Pt supine in bed agreeable to therapy with no c/o pain. Session focused on ADL transfers and R UE NMR. Pt directed caregiver care of donning B shoes independently, requiring total A. Manual facilitation required to position slideboard while pt sitting EOB, as well as mod A to come to sitting upright following weightbearing through R elbow to laterally lean. Pt completed slideboard transfer to TIS w/c with max A +2. Pt was provided with yellow theraputty and instructed in several exercises to perform bi and uni manually to facilitate R hand NMR. Pt and wife performed teach back with exercises and were excited to try these on their own. Pt was left sitting up with QRB donned and all needs met.   Therapy Documentation Precautions:  Precautions Precautions: Fall, Other (comment) Precaution Comments: R hemi, lateral lean and pushes to the R; Cortrak Required Braces or Orthoses: Other Brace/Splint Other Brace/Splint: bilateral upright AFO's (used PTA. Has h/o charcot foot on L) Restrictions Weight Bearing Restrictions: No Vital Signs: Therapy Vitals Temp: 97.9 F (36.6 C) Temp Source: Oral Pulse Rate: 82 Resp: 18 BP: 127/76 Patient Position (if appropriate): Sitting Oxygen Therapy SpO2: 96 % O2 Device: Room Air Pain: Pain Assessment Pain Scale: 0-10 Pain Score: 0-No pain ADL: ADL ADL Comments: see functional navigator  See Function Navigator for Current Functional Status.   Therapy/Group: Individual Therapy  Curtis Sites 10/08/2017, 3:36 PM

## 2017-10-08 NOTE — Progress Notes (Signed)
Patient slept on and off during the night. He frequently woke up requesting in/out cath.

## 2017-10-09 NOTE — Progress Notes (Signed)
Subjective/Complaints:  Woke up twice requesting cath, cath vol 247ml and 45ml Per pt and daughter SNF has bed available when pt is stable from CIR standpoint  ROS: Limited due to cognitive/behavioral , no pains no breathing problems , no nausea or vomiting,   Objective: Vital Signs: Blood pressure (!) 128/98, pulse 83, temperature 98 F (36.7 C), temperature source Oral, resp. rate 18, height 6\' 2"  (1.88 m), weight 98.1 kg (216 lb 4.3 oz), SpO2 95 %. No results found. No results found for this or any previous visit (from the past 72 hour(s)).   Constitutional: No distress . Vital signs reviewed. HEENT: EOMI, oral membranes moist Neck: supple Cardiovascular: RRR without murmur. No JVD    Respiratory: CTA Bilaterally without wheezes or rales. Normal effort    GI: BS +, non-tender, non-distended  Skin:   Other IV sites and nares CDI Neuro:  Alert, follows simple commands.  Decreased insight and awareness., Abnormal Sensory Reduced bilateral below ankles, Abnormal Motor 3-/5 RUE grip, 2- RUE Bicepts and Triceps and 2- RLE hip/knee extensor synergy as well as plantarflexion.  Moderately dysarthric Musc/Skel:  Bilateral AFOs Psych: Remains restless/anxious  Assessment/Plan: 1. 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 Physiatrist is providing close team supervision and 24 hour management of active medical problems listed below. Physiatrist and rehab team continue to assess barriers to discharge/monitor patient progress toward functional and medical goals. FIM: Function - Bathing Position: Bed Body parts bathed by helper: Right arm, Left arm, Chest, Abdomen, Front perineal area, Buttocks, Right upper leg, Left upper leg, Right lower leg, Left lower leg, Back Assist Level: 2 helpers  Function- Upper Body Dressing/Undressing What is the patient wearing?:  Pull over shirt/dress Pull over shirt/dress - Perfomed by patient: Thread/unthread left sleeve, Put head through opening Pull over shirt/dress - Perfomed by helper: Thread/unthread right sleeve, Put head through opening, Pull shirt over trunk Assist Level: More than reasonable time, Touching or steadying assistance(Pt > 75%) Function - Lower Body Dressing/Undressing What is the patient wearing?: Pants, AFO, Shoes, Socks Position: Bed Pants- Performed by helper: Thread/unthread right pants leg, Thread/unthread left pants leg, Pull pants up/down(family performed task) Socks - Performed by helper: Don/doff right sock, Don/doff left sock(family performed task) Shoes - Performed by helper: Don/doff right shoe, Don/doff left shoe, Fasten right, Fasten left(family performed task) AFO - Performed by helper: Don/doff right AFO, Don/doff left AFO TED Hose - Performed by helper: Don/doff right TED hose, Don/doff left TED hose Assist for footwear: Maximal assist Assist for lower body dressing: 2 Helpers  Function - Toileting Toileting activity did not occur: Safety/medical concerns Toileting steps completed by helper: Adjust clothing prior to toileting, Adjust clothing after toileting, Performs perineal hygiene Toileting Assistive Devices: Other (comment)(Using [employer]) Assist level: Two helpers  Function - Air cabin crew transfer activity did not occur: Safety/medical concerns Toilet transfer assistive device: Facilities manager lift: Garment/textile technologist level to toilet: 2 helpers Assist level from toilet: 2 helpers  Function - Chair/bed transfer Chair/bed transfer [place]: Lateral scoot Chair/bed transfer assist level: 2 helpers Chair/bed transfer assistive device: Sliding board Mechanical lift: [place] Chair/bed transfer details: Tactile cues for initiation, Tactile cues for sequencing, Tactile cues for weight shifting, Tactile cues for posture, Tactile cues for placement, Verbal  cues for sequencing, Verbal cues for technique, Manual facilitation for placement, Manual facilitation for weight shifting  Function - Locomotion: Wheelchair Will patient  use wheelchair at discharge?: Yes Type: Manual Max wheelchair distance: 90 ft  Assist Level: Supervision or verbal cues Assist Level: Supervision or verbal cues Assist Level: Dependent (Pt equals 0%) Turns around,maneuvers to table,bed, and toilet,negotiates 3% grade,maneuvers on rugs and over doorsills: No Function - Locomotion: Ambulation Ambulation activity did not occur: Safety/medical concerns Assistive device: Rail in hallway Max distance: 2 Assist level: 2 helpers Walk 10 feet activity did not occur: Safety/medical concerns Walk 50 feet with 2 turns activity did not occur: Safety/medical concerns Walk 150 feet activity did not occur: Safety/medical concerns Walk 10 feet on uneven surfaces activity did not occur: Safety/medical concerns  Function - Comprehension Comprehension: Auditory Comprehension assistive device: Hearing aids Comprehension assist level: Understands basic 75 - 89% of the time/ requires cueing 10 - 24% of the time  Function - Expression Expression: Verbal Expression assist level: Expresses basic 75 - 89% of the time/requires cueing 10 - 24% of the time. Needs helper to occlude trach/needs to repeat words.  Function - Social Interaction Social Interaction assist level: Interacts appropriately 75 - 89% of the time - Needs redirection for appropriate language or to initiate interaction.  Function - Problem Solving Problem solving assist level: Solves basic 25 - 49% of the time - needs direction more than half the time to initiate, plan or complete simple activities  Function - Memory Memory assistive device: Memory book Memory assist level: Recognizes or recalls 25 - 49% of the time/requires cueing 50 - 75% of the time Patient normally able to recall (first 3 days only): That he or she is  in a hospital  Medical Problem List and Plan: 1.  Right side weakness with dysarthria secondary to multiple bilateral infarcts (left BG, tiny Right frontal and bilateral  occipital infarcts) consistent with cardiac emboli with history of atrial fibrillation.Onset 5/29 Wean off ritalin See Palliative note- will proceed with SNF    Motor strength improving in RUE,discussed progress with family  Continue CIR PT OT speech, 2.  DVT Prophylaxis/Anticoagulation: Eliquis 3. Pain Management/polyneuropathy: Tylenol as needed 4. Mood/sleep/confusion: Patient is quite happy with the Remeron.  Slept ok last noc    sleep graph showed 4-5hr last noc 5. Neuropsych: This patient is capable of making decisions on his own behalf. 6. Skin/Wound Care: Routine skin checks 7. Fluids/Electrolytes/Nutrition: BMET normal 6/4  -Appreciate dietary consult  8.  Dysphagia.      Improved with good intake! 9.  History of atrial fibrillation.  Continue Eliquis 5mg  BID.  Cardiac rate controlled, on metoprolol PTA 10.  Hyperlipidemia.  Zocor on lipitor PTA 11.  History of polyneuropathy.  Patient uses bilateral AFO braces prior to admission.  No up with her deficits however 12.  Hx HTN lasix and Lisinopril PTA Vitals:   10/08/17 2040 10/09/17 0530  BP: 112/67 (!) 128/98  Pulse: 82 83  Resp: 18 18  Temp: 98 F (36.7 C) 98 F (36.7 C)  SpO2: 96% 95%  BPs generally well controlled elevated diastolic this am  13.  Hx of GERD-  Pepcid to 40mg  BID, added protonix 40mg  daily, improved 14.  Nausea without vomiting, question medication versus tube feeding versus constipation.  Appears to have improved partially 15.  Constipation improving per pt 16.  Urinary retention  likely multifactorial immobility and prob BPH,   flomax increased to home dose 0.8mg , added urecholine will titrate up to 25mg  TID, cath vol 399ml last noc  -Urine culture positive enterococcus  completed Amoxil Will continue in and out  catheterizations, he  would likely require less catheterization at night without the nocturnal tube feeds. Insert  Foley in am if patient still requiring intermittent catheterizations- will stop urecholine as this is causing some bladder spasms but not voiding 17.  sleep wake cycle disruption, improved sleep last noc   increased Remeron, increase Ritalin breakfast and lunch 18.  Overall patient has had some neurologic improvements however the effort of intensive inpatient rehabilitation is too much for him and he will have a goals of care meeting with the palliative medicine team on Friday. Have reduced therapy scheduled to daily for each discipline Daughter who is [phone]-LOS (Days) [place] Ronda Kazmi 10/09/2017, 10:25 AM

## 2017-10-09 NOTE — Plan of Care (Signed)
  Problem: Consults Goal: RH STROKE PATIENT EDUCATION Description See Patient Education module for education specifics  Outcome: Progressing Goal: Nutrition Consult-if indicated Outcome: Progressing   Problem: RH BOWEL ELIMINATION Goal: RH STG MANAGE BOWEL WITH ASSISTANCE Description STG Manage Bowel with moderate Assistance.  Outcome: Progressing Goal: RH STG MANAGE BOWEL W/MEDICATION W/ASSISTANCE Description STG Manage Bowel with Medication with min Assistance.  Outcome: Progressing   Problem: RH BLADDER ELIMINATION Goal: RH STG MANAGE BLADDER WITH ASSISTANCE Description STG Manage Bladder With moderate Assistance  Outcome: Progressing Goal: RH STG MANAGE BLADDER WITH EQUIPMENT WITH ASSISTANCE Description STG Manage Bladder With Equipment and With total Assistance  Outcome: Progressing   Problem: RH SKIN INTEGRITY Goal: RH STG SKIN FREE OF INFECTION/BREAKDOWN Description Skin to remain free from breakdown with min assistance while controlling incontinence by toileting patient every 4 hours or more frequently as needed by patient.  Outcome: Progressing Goal: RH STG MAINTAIN SKIN INTEGRITY WITH ASSISTANCE Description STG Maintain Skin Integrity With moderate Assistance.  Outcome: Progressing Goal: RH STG ABLE TO PERFORM INCISION/WOUND CARE W/ASSISTANCE Description STG Able To Perform skin Care With moderate Assistance.  Outcome: Progressing   Problem: RH SAFETY Goal: RH STG ADHERE TO SAFETY PRECAUTIONS W/ASSISTANCE/DEVICE Description STG Adhere to Safety Precautions With moderate Assistance and appropriate assistive Devices.  Outcome: Progressing Goal: RH STG DECREASED RISK OF FALL WITH ASSISTANCE Description STG Decreased Risk of Fall With moderate Assistance while following CIR's new standard toileting protocol of every 4 hours.  Outcome: Progressing   Problem: RH PAIN MANAGEMENT Goal: RH STG PAIN MANAGED AT OR BELOW PT'S PAIN GOAL Description < 3 on a 0-10  pain scale  Outcome: Progressing   Problem: RH KNOWLEDGE DEFICIT Goal: RH STG INCREASE KNOWLEDGE OF HYPERTENSION Description Patient and spouse will be able to demonstrate knowledge of new HTN medications, including dosage amounts, dosages per day, and follow-up care with the MD post discharge with min assist from rehab staff.  Outcome: Progressing Goal: RH STG INCREASE KNOWLEDGE OF DYSPHAGIA/FLUID INTAKE Description Patient and spouse will be able to demonstrate knowledge of safe swallowing techniques, and proper techniques for thickening liquids with min assist from rehab staff.  Outcome: Progressing

## 2017-10-09 NOTE — Progress Notes (Signed)
Patient slept 2-3 hours at a time, but frequently woke up to request cath's.

## 2017-10-10 ENCOUNTER — Inpatient Hospital Stay (HOSPITAL_COMMUNITY): Payer: Medicare Other | Admitting: Occupational Therapy

## 2017-10-10 ENCOUNTER — Inpatient Hospital Stay (HOSPITAL_COMMUNITY): Payer: Medicare Other

## 2017-10-10 ENCOUNTER — Inpatient Hospital Stay (HOSPITAL_COMMUNITY): Payer: Medicare Other | Admitting: Physical Therapy

## 2017-10-10 DIAGNOSIS — R339 Retention of urine, unspecified: Secondary | ICD-10-CM

## 2017-10-10 NOTE — Progress Notes (Signed)
Physical Therapy Discharge Summary  Patient Details  Name: Walter Bryant MRN: [id number] Date of Birth: Dec 20, 1930  Today's Date: 10/10/2017   Patient has met 2 of 4 long term goals due to improved balance, improved postural control, increased strength, ability to compensate for deficits and improved awareness. Pt only met 2/4 goals on one occasion each, as pt is very inconsistent and requires extensive (total +1-2 assist) help majority of the time. Patient to discharge at a wheelchair level total +1-2 for transfers.  Reasons goals not met: decreased strength, endurance, activity tolerance, balance & impaired cognition.  Recommendation:  Patient will benefit from ongoing skilled PT services in skilled nursing facility setting to continue to advance safe functional mobility, address ongoing impairments in decreased activity tolerance, decreased postural control, impaired cognition, decreased balance, bed mobility, transfers, w/c sitting tolerance and mobility, and minimize fall risk.  Equipment: TBD in next venue of care  Reasons for discharge: lack of progress toward goals  Patient/family agrees with progress made and goals achieved: Yes  PT Discharge Precautions/Restrictions Precautions Precautions: Fall;Other (comment) Precaution Comments: R hemi Required Braces or Orthoses: Other Brace/Splint Other Brace/Splint: bilateral upright AFO's (used PTA. 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   Cognition Overall Cognitive Status: Impaired/Different from baseline AxO x 4 Focused Attention: Impaired Focused Attention Impairment: Functional basic;Verbal basic Sustained Attention: Impaired Sustained Attention Impairment: Verbal basic;Functional basic Selective Attention: Impaired Memory: Impaired Memory Impairment: Decreased recall of new information;Decreased short term memory Awareness:  Impaired Awareness Impairment: Emergent impairment;Anticipatory impairment Problem Solving: Impaired Problem Solving Impairment: Verbal basic;Functional basic Behaviors: Restless Safety/Judgment: Impaired   Sensation Pt reports BLE sensation intact & equal to light touch around B patellar regions.   Motor  Motor Motor: Hemiplegia;Abnormal tone;Abnormal postural alignment and control Motor - Discharge Observations: global weakness, R hemi   Mobility Bed Mobility (completed in hospital bed) Bed Mobility: Rolling Right;Sit to Supine;Rolling Left;Supine to Sit Rolling Right: Total Assistance - Patient < 25% Rolling Left: Total Assistance - Patient < 25% Supine to Sit: Total Assistance - Patient < 25% Sit to Supine: 2 Helpers Transfers Transfers: Sit to Stand Sit to Stand: Dependent - mechanical lift(standing frame) Lateral/Scoot Transfers: 2 Press photographer (Assistive device): (slideboard)   Locomotion  Gait Ambulation: No Stairs / Additional Locomotion Stairs: No  Pt able to propel w/c on one occasion during LOS with L hemi technique and supervision x 100 ft - unable to participate in activity since 2/2 decreased endurance & activity tolerance. Pt also with difficulty sitting in reclining back manual w/c, only tolerating TIS w/c.   Trunk/Postural Assessment  Cervical Assessment Cervical Assessment: (forward head) Thoracic Assessment Thoracic Assessment: (rounded shoulders) Lumbar Assessment Lumbar Assessment: (posterior pelvic tilt) Postural Control Postural Control: Deficits on evaluation Righting Reactions: impaired Protective Responses: impaired   Balance Per OT note on this date - pt able to sit EOB with supervision, max assist for slide board for dynamic balance.  Extremity Assessment  LUE & LLE (hip & knee) WFL  RLE: 1/5 hip flexion, 2-/5 knee extension Limited by generalized weakness throughout. Pt reports limited ankle dorsiflexion at baseline hence the  need for B AFOs.   See Function Navigator for Current Functional Status.  [name] 10/10/2017, 4:15 PM
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
## 2017-10-10 NOTE — Progress Notes (Signed)
Subjective/Complaints:  Restless last night. Feels anxious today. Wants something for "day time anxiety"  ROS: Patient denies fever, rash, sore throat, blurred vision, nausea, vomiting, diarrhea, cough, shortness of breath or chest pain, joint or back pain, headache    Objective: Vital Signs: Blood pressure (!) 151/74, pulse 81, temperature 98 F (36.7 C), temperature source Oral, resp. rate 18, height 6\' 2"  (1.88 m), weight 98.1 kg (216 lb 4.3 oz), SpO2 100 %. No results found. No results found for this or any previous visit (from the past 72 hour(s)).   Constitutional: No distress . Vital signs reviewed. HEENT: EOMI, oral membranes moist Neck: supple Cardiovascular: RRR without murmur. No JVD    Respiratory: CTA Bilaterally without wheezes or rales. Normal effort    GI: BS +, non-tender, non-distended    Skin:   Other IV sites and nares CDI Neuro:  Alert, follows simple commands.  Decreased insight and awareness., Abnormal Sensory Reduced bilateral below ankles, Abnormal Motor 3-/5 RUE grip, 2- RUE Bicepts and Triceps and 2- RLE hip/knee extensor synergy as well as plantarflexion.  Moderately dysarthric Musc/Skel:  Bilateral AFOs  Psych: anxious, panting  Assessment/Plan: 1. 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 Physiatrist is providing close team supervision and 24 hour management of active medical problems listed below. Physiatrist and rehab team continue to assess barriers to discharge/monitor patient progress toward functional and medical goals. FIM: Function - Bathing Position: Bed Body parts bathed by helper: Right arm, Left arm, Chest, Abdomen, Front perineal area, Buttocks, Right upper leg, Left upper leg, Right lower leg, Left lower leg, Back Assist Level: 2 helpers  Function- Upper Body Dressing/Undressing What is the patient  wearing?: Pull over shirt/dress Pull over shirt/dress - Perfomed by patient: Thread/unthread left sleeve, Put head through opening Pull over shirt/dress - Perfomed by helper: Thread/unthread right sleeve, Put head through opening, Pull shirt over trunk Assist Level: More than reasonable time, Touching or steadying assistance(Pt > 75%) Function - Lower Body Dressing/Undressing What is the patient wearing?: Pants, AFO, Shoes, Socks Position: Bed Pants- Performed by helper: Thread/unthread right pants leg, Thread/unthread left pants leg, Pull pants up/down(family performed task) Socks - Performed by helper: Don/doff right sock, Don/doff left sock(family performed task) Shoes - Performed by helper: Don/doff right shoe, Don/doff left shoe, Fasten right, Fasten left(family performed task) AFO - Performed by helper: Don/doff right AFO, Don/doff left AFO TED Hose - Performed by helper: Don/doff right TED hose, Don/doff left TED hose Assist for footwear: Maximal assist Assist for lower body dressing: 2 Helpers  Function - Toileting Toileting activity did not occur: Safety/medical concerns Toileting steps completed by helper: Adjust clothing prior to toileting, Adjust clothing after toileting, Performs perineal hygiene Toileting Assistive Devices: Other (comment)(Using [employer]) Assist level: Two helpers  Function - Air cabin crew transfer activity did not occur: Safety/medical concerns Toilet transfer assistive device: Facilities manager lift: Garment/textile technologist level to toilet: 2 helpers Assist level from toilet: 2 helpers  Function - Chair/bed transfer Chair/bed transfer [place]: Lateral scoot Chair/bed transfer assist level: 2 helpers Chair/bed transfer assistive device: Sliding board Mechanical lift: [place] Chair/bed transfer details: Tactile cues for initiation, Tactile cues for sequencing, Tactile cues for weight shifting, Tactile cues for posture, Tactile cues for  placement, Verbal cues for sequencing, Verbal cues for technique, Manual facilitation for placement, Manual facilitation for weight shifting  Function - Locomotion: Wheelchair Will patient use  wheelchair at discharge?: Yes Type: Manual Max wheelchair distance: 90 ft  Assist Level: Supervision or verbal cues Assist Level: Supervision or verbal cues Assist Level: Dependent (Pt equals 0%) Turns around,maneuvers to table,bed, and toilet,negotiates 3% grade,maneuvers on rugs and over doorsills: No Function - Locomotion: Ambulation Ambulation activity did not occur: Safety/medical concerns Assistive device: Rail in hallway Max distance: 2 Assist level: 2 helpers Walk 10 feet activity did not occur: Safety/medical concerns Walk 50 feet with 2 turns activity did not occur: Safety/medical concerns Walk 150 feet activity did not occur: Safety/medical concerns Walk 10 feet on uneven surfaces activity did not occur: Safety/medical concerns  Function - Comprehension Comprehension: Auditory Comprehension assistive device: Hearing aids Comprehension assist level: Understands basic 75 - 89% of the time/ requires cueing 10 - 24% of the time  Function - Expression Expression: Verbal Expression assist level: Expresses basic 75 - 89% of the time/requires cueing 10 - 24% of the time. Needs helper to occlude trach/needs to repeat words.  Function - Social Interaction Social Interaction assist level: Interacts appropriately 75 - 89% of the time - Needs redirection for appropriate language or to initiate interaction.  Function - Problem Solving Problem solving assist level: Solves basic 25 - 49% of the time - needs direction more than half the time to initiate, plan or complete simple activities  Function - Memory Memory assistive device: Memory book Memory assist level: Recognizes or recalls 25 - 49% of the time/requires cueing 50 - 75% of the time Patient normally able to recall (first 3 days only):  That he or she is in a hospital  Medical Problem List and Plan: 1.  Right side weakness with dysarthria secondary to multiple bilateral infarcts (left BG, tiny Right frontal and bilateral  occipital infarcts) consistent with cardiac emboli with history of atrial fibrillation.Onset 5/29   See Palliative note- SNF tomorrow?, therapy intensity reduced on CIR  -Continue CIR PT OT speech in the meantime  2.  DVT Prophylaxis/Anticoagulation: Eliquis 3. Pain Management/polyneuropathy: Tylenol as needed 4. Mood/sleep/confusion: remeron  -anxiety fluctuates,. Element of sundowning too  -should do a little better out of hospital  -hesitate adding anything new for acute anxiety given tolerance/history 5. Neuropsych: This patient is capable of making decisions on his own behalf. 6. Skin/Wound Care: Routine skin checks 7. Fluids/Electrolytes/Nutrition: BMET normal 6/4  -Appreciate dietary consult  8.  Dysphagia. Improved with good intake! 9.  History of atrial fibrillation.  Continue Eliquis 5mg  BID.  Cardiac rate controlled, on metoprolol PTA 10.  Hyperlipidemia.  Zocor on lipitor PTA 11.  History of polyneuropathy.  Patient uses bilateral AFO braces prior to admission.  No up with her deficits however 12.  Hx HTN lasix and Lisinopril PTA Vitals:   10/09/17 1935 10/10/17 0408  BP: 128/78 (!) 151/74  Pulse: 85 81  Resp: 18 18  Temp: 98 F (36.7 C) 98 F (36.7 C)  SpO2: 96% 100%  BPs generally controlled 6/24 13.  Hx of GERD-  Pepcid to 40mg  BID, added protonix 40mg  daily, improved 14.  Nausea without vomiting, question medication versus tube feeding versus constipation.  Appears to have improved partially 15.  Constipation improving per pt 16.  Urinary retention  likely multifactorial immobility and prob BPH,   flomax increased to home dose 0.8mg , added urecholine will titrate up to 25mg  TID, cath vol 500cc x 2 overnight/morning  -Urine culture positive enterococcus  completed Amoxil  -Will  continue in and out catheterizations, he would likely require less catheterization  at night without the nocturnal tube feeds.  Insert  Foley likely today as  patient still requiring intermittent catheterizations-  -if we proceed with foley, then stop urecholine  17.  sleep wake cycle disruption, improved sleep with increased Remeron    Daughter who is [phone] (Days) 21 A FACE TO FACE EVALUATION WAS PERFORMED  Meredith Staggers 10/10/2017, 9:07 AM

## 2017-10-10 NOTE — Discharge Summary (Signed)
Discharge summary job # [id number]

## 2017-10-10 NOTE — Plan of Care (Signed)
Problem: Consults Goal: RH STROKE PATIENT EDUCATION Description See Patient Education module for education specifics  Outcome: Progressing Goal: Nutrition Consult-if indicated Outcome: Progressing   Problem: RH BOWEL ELIMINATION Goal: RH STG MANAGE BOWEL WITH ASSISTANCE Description STG Manage Bowel with moderate Assistance.  Outcome: Progressing Goal: RH STG MANAGE BOWEL W/MEDICATION W/ASSISTANCE Description STG Manage Bowel with Medication with min Assistance.  Outcome: Progressing   Problem: RH BLADDER ELIMINATION Goal: RH STG MANAGE BLADDER WITH ASSISTANCE Description STG Manage Bladder With moderate Assistance  Outcome: Progressing Goal: RH STG MANAGE BLADDER WITH EQUIPMENT WITH ASSISTANCE Description STG Manage Bladder With Equipment and With total Assistance  Outcome: Progressing   Problem: RH SKIN INTEGRITY Goal: RH STG SKIN FREE OF INFECTION/BREAKDOWN Description Skin to remain free from breakdown with min assistance while controlling incontinence by toileting patient every 4 hours or more frequently as needed by patient.  Outcome: Progressing Goal: RH STG MAINTAIN SKIN INTEGRITY WITH ASSISTANCE Description STG Maintain Skin Integrity With moderate Assistance.  Outcome: Progressing Goal: RH STG ABLE TO PERFORM INCISION/WOUND CARE W/ASSISTANCE Description STG Able To Perform skin Care With moderate Assistance.  Outcome: Progressing   Problem: RH SAFETY Goal: RH STG ADHERE TO SAFETY PRECAUTIONS W/ASSISTANCE/DEVICE Description STG Adhere to Safety Precautions With moderate Assistance and appropriate assistive Devices.  Outcome: Progressing Goal: RH STG DECREASED RISK OF FALL WITH ASSISTANCE Description STG Decreased Risk of Fall With moderate Assistance while following CIR's new standard toileting protocol of every 4 hours.  Outcome: Progressing   Problem: RH PAIN MANAGEMENT Goal: RH STG PAIN MANAGED AT OR BELOW PT'S PAIN GOAL Description < 3 on a 0-10  pain scale  Outcome: Progressing   Problem: RH KNOWLEDGE DEFICIT Goal: RH STG INCREASE KNOWLEDGE OF HYPERTENSION Description Patient and spouse will be able to demonstrate knowledge of new HTN medications, including dosage amounts, dosages per day, and follow-up care with the MD post discharge with min assist from rehab staff.  Outcome: Progressing  Problem: Consults Goal: RH STROKE PATIENT EDUCATION Description See Patient Education module for education specifics  Outcome: Progressing Goal: Nutrition Consult-if indicated Outcome: Progressing   Problem: RH BOWEL ELIMINATION Goal: RH STG MANAGE BOWEL WITH ASSISTANCE Description STG Manage Bowel with moderate Assistance.  Outcome: Progressing Goal: RH STG MANAGE BOWEL W/MEDICATION W/ASSISTANCE Description STG Manage Bowel with Medication with min Assistance.  Outcome: Progressing   Problem: RH BLADDER ELIMINATION Goal: RH STG MANAGE BLADDER WITH ASSISTANCE Description STG Manage Bladder With moderate Assistance  Outcome: Progressing Goal: RH STG MANAGE BLADDER WITH EQUIPMENT WITH ASSISTANCE Description STG Manage Bladder With Equipment and With total Assistance  Outcome: Progressing   Problem: RH SKIN INTEGRITY Goal: RH STG SKIN FREE OF INFECTION/BREAKDOWN Description Skin to remain free from breakdown with min assistance while controlling incontinence by toileting patient every 4 hours or more frequently as needed by patient.  Outcome: Progressing Goal: RH STG MAINTAIN SKIN INTEGRITY WITH ASSISTANCE Description STG Maintain Skin Integrity With moderate Assistance.  Outcome: Progressing Goal: RH STG ABLE TO PERFORM INCISION/WOUND CARE W/ASSISTANCE Description STG Able To Perform skin Care With moderate Assistance.  Outcome: Progressing   Problem: RH SAFETY Goal: RH STG ADHERE TO SAFETY PRECAUTIONS W/ASSISTANCE/DEVICE Description STG Adhere to Safety Precautions With moderate Assistance and appropriate assistive  Devices.  Outcome: Progressing Goal: RH STG DECREASED RISK OF FALL WITH ASSISTANCE Description STG Decreased Risk of Fall With moderate Assistance while following CIR's new standard toileting protocol of every 4 hours.  Outcome: Progressing   Problem: RH PAIN MANAGEMENT Goal: RH STG  PAIN MANAGED AT OR BELOW PT'S PAIN GOAL Description < 3 on a 0-10 pain scale  10/10/2017 1144 by Erie Noe, LPN Outcome: Progressing 10/10/2017 1144 by Erie Noe, LPN Outcome: Progressing   Problem: RH KNOWLEDGE DEFICIT Goal: RH STG INCREASE KNOWLEDGE OF HYPERTENSION Description Patient and spouse will be able to demonstrate knowledge of new HTN medications, including dosage amounts, dosages per day, and follow-up care with the MD post discharge with min assist from rehab staff.  10/10/2017 1144 by Erie Noe, LPN Outcome: Progressing 10/10/2017 1144 by Erie Noe, LPN Outcome: Progressing

## 2017-10-10 NOTE — Progress Notes (Signed)
Social Work  Discharge Note  The overall goal for the admission was met for:   Discharge location: Yes-[facility]  Length of Stay: Yes-22 DAYS  Discharge activity level: Yes-MOD/MAX LEVEL OF ASSIST  Home/community participation: Yes  Services provided included: MD, RD, PT, OT, SLP, RN, CM, Pharmacy, Neuropsych and SW  Financial Services: Medicare and Private Insurance: [place]  Follow-up services arranged: Other: [facility] NHP  Comments (or additional information):WIFE AND DAUGHTER INVOLVED AND PALLIATIVE CARE CONSULTED REGARDING GOALS OF CARE. LIV[facility] AT [facility] AND WILL GO TO THEIR SNF FACILITY.  Patient/Family verbalized understanding of follow-up arrangements: Yes  Individual responsible for coordination of the follow-up plan: SELF & JEAN-WIFE  Confirmed correct DME delivered: Elease Hashimoto 10/10/2017    Elease Hashimoto

## 2017-10-10 NOTE — Progress Notes (Signed)
Occupational Therapy Discharge Summary  Patient Details  Name: Walter Bryant MRN: [id number] Date of Birth: 12-29-1930  Patient has met 7 of 7 long term goals due to improved activity tolerance, improved balance, postural control and improved awareness.  Pt's goals were downgraded during rehab admission to max-total A level. Patient to discharge at [facility] Max Assist level.  Patient's wife is unable to provide the physical and cognitive assist pt requires and therefore pt to transition to SNF in order to cont to reduce caregiver burden and increase independence with ADLs. Pt currently requires +2 assist for sliding board transfers. He is total A toileting and LB dressing from bed level. He is able to complete UB dressing and grooming tasks with supervision- set-up. Pt's wife has been present throughout rehab admission, is aware of how much care pt currently requires and is in agreement with d/c to SNF.  Recommendation:  Patient will benefit from ongoing skilled OT services in skilled nursing facility setting to continue to advance functional skills in the area of BADL and Reduce care partner burden.  Equipment: To be determined at next venue of care  Reasons for discharge: lack of progress toward goals and discharge from hospital  Patient/family agrees with progress made and goals achieved: Yes  OT Discharge Precautions/Restrictions  Precautions Precautions: Fall;Other (comment) Precaution Comments: R hemi Required Braces or Orthoses: Other Brace/Splint Other Brace/Splint: bilateral upright AFO's (used PTA. Has h/o charcot foot on L) Restrictions Weight Bearing Restrictions: No ADL ADL ADL Comments: see functional navigator Vision Baseline Vision/History: Wears glasses Wears Glasses: At all times Patient Visual Report: No change from baseline Vision Assessment?: No apparent visual deficits Perception  Perception: Impaired Inattention/Neglect: Does not attend to right side of  body Praxis Praxis: Impaired Praxis Impairment Details: Motor planning;Perseveration Cognition Overall Cognitive Status: Impaired/Different from baseline Arousal/Alertness: Awake/alert Orientation Level: Oriented X4 Attention: Focused Focused Attention: Impaired Focused Attention Impairment: Functional basic;Verbal basic Sustained Attention: Impaired Sustained Attention Impairment: Verbal basic;Functional basic Selective Attention: Impaired Selective Attention Impairment: Verbal complex;Functional complex Memory: Impaired Memory Impairment: Decreased recall of new information;Decreased short term memory Decreased Short Term Memory: Functional basic Awareness: Impaired Awareness Impairment: Emergent impairment;Anticipatory impairment Problem Solving: Impaired Problem Solving Impairment: Verbal basic;Functional basic Behaviors: Restless Safety/Judgment: Impaired Sensation Sensation Light Touch: Impaired Detail Peripheral sensation comments: Chronic B LE neuropathy Light Touch Impaired Details: Absent RLE;Absent LLE Proprioception: Impaired Detail Proprioception Impaired Details: Impaired RUE Coordination Gross Motor Movements are Fluid and Coordinated: No Fine Motor Movements are Fluid and Coordinated: No Coordination and Movement Description: R hemiparesis Motor  Motor Motor: Hemiplegia;Abnormal tone;Abnormal postural alignment and control Motor - Discharge Observations: global weakness, R hemi Mobility  Bed Mobility Bed Mobility: Rolling Right;Sit to Supine;Rolling Left;Supine to Sit Rolling Right: Total Assistance - Patient < 25% Rolling Left: Total Assistance - Patient < 25% Supine to Sit: Total Assistance - Patient < 25% Sit to Supine: 2 Helpers Transfers Sit to Stand: Dependent - mechanical lift(standing frame)  Trunk/Postural Assessment  Cervical Assessment Cervical Assessment: Exceptions to WFL(Forward head) Thoracic Assessment Thoracic Assessment: Exceptions  to WFL(Rounded shoulders) Lumbar Assessment Lumbar Assessment: Exceptions to WFL(Posterior pelvic tilt with pelvic obliquity) Postural Control Postural Control: Deficits on evaluation Righting Reactions: impaired Protective Responses: impaired  Balance Balance Balance Assessed: Yes Static Sitting Balance Static Sitting - Balance Support: Feet supported;No upper extremity supported Static Sitting - Level of Assistance: 5: Stand by assistance Static Sitting - Comment/# of Minutes: Sitting EOB without UE support Dynamic Sitting Balance Dynamic Sitting - Balance  Support: During functional activity;Feet supported Dynamic Sitting - Level of Assistance: 4: Min assist Sitting balance - Comments: Sitting EOB Extremity/Trunk Assessment RUE Assessment RUE Assessment: Exceptions to [facility] RUE Strength RUE Overall Strength: Deficits RUE Overall Strength Comments: 2-/5 R bicep/ tricep; 3-5 gross grasp, 2/5 wrist flexion/extension RUE Tone RUE Tone: Modified Ashworth Modified Ashworth Scale for Grading Hypertonia RUE: No increase in muscle tone RUE Tone Comments: Throughout LUE Assessment LUE Assessment: Within Functional Limits   See Function Navigator for Current Functional Status.  Latif Nazareno L 10/10/2017, 2:52 PM

## 2017-10-10 NOTE — Progress Notes (Signed)
Occupational Therapy Session Note  Patient Details  Name: Walter Bryant MRN: [id number] Date of Birth: 1931/02/06  Today's Date: 10/10/2017 OT Individual Time: 1345-1413 OT Individual Time Calculation (min): 28 min    Short Term Goals: Week 3:  OT Short Term Goal 1 (Week 3): STG=LTG due to LOS  Skilled Therapeutic Interventions/Progress Updates:    Pt seen for OT session focusing on ADL re-training and functional transfer. Pt sitting up in tilt-in-space w/c upon arrival, complaints of lower back/ buttock pain from sitting in chair and desiring to return to bed. Requested first to change shirt. Pt able to doff and don shirt with VCs only for problem solving method, able to independently recall hemi- dressing technique.  Pt then completed max A sliding board transfer to EOB, max A +1 physical assist with second person to stabilize equipment, multimodal cuing for anterior weight shift and hand placement during transfer. Pt able to assist with transfer once able to reach bedrail. He sat EOB with supervision, mod A to return to supine and pt able to assist with puling self up in bed using hospital bed functions. Pt left in supine to rest, positioned with pillow for comfort and all needs in reach.   Pt tearful throughout session, talking about death and being envious of people who die without struggle. Empathetic listening and support provided.   Therapy Documentation Precautions:  Precautions Precautions: Fall, Other (comment) Precaution Comments: R hemi, lateral lean and pushes to the R Required Braces or Orthoses: Other Brace/Splint Other Brace/Splint: bilateral upright AFO's (used PTA. Has h/o charcot foot on L) Restrictions Weight Bearing Restrictions: No ADL: ADL ADL Comments: see functional navigator  See Function Navigator for Current Functional Status.   Therapy/Group: Individual Therapy  Dystany Duffy L 10/10/2017, 7:16 AM

## 2017-10-10 NOTE — Progress Notes (Signed)
Per pt and wife they wanted FC placed today d/t pt being cathed frequently. Nurse placed 14 Fr. Foley catheter without difficulty. 10 mL balloon. Urine return noted. Yellow and clear. No pain or discomfort noted. Stat lock applied. Drainage bag placed on chair. Pt and family educated. Call bell within reach. Will cont to monitor.  Pamella Pert, LPN

## 2017-10-10 NOTE — Progress Notes (Signed)
Physical Therapy Session Note  Patient Details  Name: PRINCETON NABOR MRN: [id number] Date of Birth: 04/26/1930  Today's Date: 10/10/2017 PT Individual Time: 2010-0712 PT Individual Time Calculation (min): 29 min   Short Term Goals: Week 3:  PT Short Term Goal 1 (Week 3): STG = LTG due to estimated d/c date.   Skilled Therapeutic Interventions/Progress Updates:  Pt received in TIS w/c & agreeable to tx. Pt reports pain in buttocks and therapist adjusted/tilted w/c PRN. Pt very emotional and sad regarding current status and situation with therapist providing support and encouragement. Pt AxO x4 during session. Transported pt outside via w/c total assist for time management and for change of scenery for therapeutic purposes to boost pt's mood. Pt performed LLE long arc quads and hip flexion exercises for strengthening. At end of session pt left sitting in TIS w/c with seat belt donned & call bell within reach.   Therapy Documentation Precautions:  Precautions Precautions: Fall, Other (comment) Precaution Comments: R hemi Required Braces or Orthoses: Other Brace/Splint Other Brace/Splint: bilateral upright AFO's (used PTA. 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   Therapy/Group: Individual Therapy  [name] 10/10/2017, 2:44 PM

## 2017-10-10 NOTE — Progress Notes (Addendum)
Social Work Patient ID: Walter Bryant, male   DOB: Apr 10, 1931, 84 y.o.   MRN: [id number]  Spoke with Aida Puffer Coordinator at [facility] who report they will have  Bed for pt tomorrow, it medically stable. Will check with MD and work toward transfer tomorrow. She would like him before lunch or right after. Will check with family regarding their preference. Met with pt and wife who would like to go tomorrow before lunch. Will schedule ambulance for 10;30 am, touch base in the am.

## 2017-10-11 ENCOUNTER — Inpatient Hospital Stay (HOSPITAL_COMMUNITY): Payer: Medicare Other | Admitting: Occupational Therapy

## 2017-10-11 DIAGNOSIS — M4800 Spinal stenosis, site unspecified: Secondary | ICD-10-CM | POA: Diagnosis not present

## 2017-10-11 DIAGNOSIS — R1312 Dysphagia, oropharyngeal phase: Secondary | ICD-10-CM | POA: Diagnosis not present

## 2017-10-11 DIAGNOSIS — N400 Enlarged prostate without lower urinary tract symptoms: Secondary | ICD-10-CM | POA: Diagnosis not present

## 2017-10-11 DIAGNOSIS — R1310 Dysphagia, unspecified: Secondary | ICD-10-CM | POA: Diagnosis not present

## 2017-10-11 DIAGNOSIS — R092 Respiratory arrest: Secondary | ICD-10-CM | POA: Diagnosis not present

## 2017-10-11 DIAGNOSIS — I4891 Unspecified atrial fibrillation: Secondary | ICD-10-CM | POA: Diagnosis not present

## 2017-10-11 DIAGNOSIS — Z9849 Cataract extraction status, unspecified eye: Secondary | ICD-10-CM | POA: Diagnosis not present

## 2017-10-11 DIAGNOSIS — I639 Cerebral infarction, unspecified: Secondary | ICD-10-CM | POA: Diagnosis not present

## 2017-10-11 DIAGNOSIS — M159 Polyosteoarthritis, unspecified: Secondary | ICD-10-CM | POA: Diagnosis not present

## 2017-10-11 DIAGNOSIS — J95821 Acute postprocedural respiratory failure: Secondary | ICD-10-CM | POA: Diagnosis not present

## 2017-10-11 DIAGNOSIS — K4090 Unilateral inguinal hernia, without obstruction or gangrene, not specified as recurrent: Secondary | ICD-10-CM | POA: Diagnosis not present

## 2017-10-11 DIAGNOSIS — I6389 Other cerebral infarction: Secondary | ICD-10-CM | POA: Diagnosis not present

## 2017-10-11 DIAGNOSIS — R7309 Other abnormal glucose: Secondary | ICD-10-CM | POA: Diagnosis not present

## 2017-10-11 DIAGNOSIS — H3530 Unspecified macular degeneration: Secondary | ICD-10-CM | POA: Diagnosis not present

## 2017-10-11 DIAGNOSIS — I714 Abdominal aortic aneurysm, without rupture: Secondary | ICD-10-CM | POA: Diagnosis not present

## 2017-10-11 DIAGNOSIS — R404 Transient alteration of awareness: Secondary | ICD-10-CM | POA: Diagnosis not present

## 2017-10-11 DIAGNOSIS — F339 Major depressive disorder, recurrent, unspecified: Secondary | ICD-10-CM | POA: Diagnosis not present

## 2017-10-11 DIAGNOSIS — M2550 Pain in unspecified joint: Secondary | ICD-10-CM | POA: Diagnosis not present

## 2017-10-11 DIAGNOSIS — K2270 Barrett's esophagus without dysplasia: Secondary | ICD-10-CM | POA: Diagnosis not present

## 2017-10-11 DIAGNOSIS — R339 Retention of urine, unspecified: Secondary | ICD-10-CM | POA: Diagnosis not present

## 2017-10-11 DIAGNOSIS — Z7401 Bed confinement status: Secondary | ICD-10-CM | POA: Diagnosis not present

## 2017-10-11 DIAGNOSIS — G609 Hereditary and idiopathic neuropathy, unspecified: Secondary | ICD-10-CM | POA: Diagnosis not present

## 2017-10-11 DIAGNOSIS — I10 Essential (primary) hypertension: Secondary | ICD-10-CM | POA: Diagnosis not present

## 2017-10-11 DIAGNOSIS — R42 Dizziness and giddiness: Secondary | ICD-10-CM | POA: Diagnosis not present

## 2017-10-11 DIAGNOSIS — F329 Major depressive disorder, single episode, unspecified: Secondary | ICD-10-CM | POA: Diagnosis not present

## 2017-10-11 DIAGNOSIS — I69391 Dysphagia following cerebral infarction: Secondary | ICD-10-CM | POA: Diagnosis not present

## 2017-10-11 DIAGNOSIS — H832X9 Labyrinthine dysfunction, unspecified ear: Secondary | ICD-10-CM | POA: Diagnosis not present

## 2017-10-11 MED ORDER — SIMVASTATIN 20 MG PO TABS: 20.0000 mg | ORAL_TABLET | Freq: Every day | ORAL | Status: AC

## 2017-10-11 MED ORDER — PANTOPRAZOLE SODIUM 40 MG PO TBEC: 40.0000 mg | DELAYED_RELEASE_TABLET | Freq: Every day | ORAL | Status: AC

## 2017-10-11 MED ORDER — CALCIUM CARBONATE-VITAMIN D 500-200 MG-UNIT PO TABS: 1.0000 | ORAL_TABLET | Freq: Every day | ORAL | Status: AC

## 2017-10-11 MED ORDER — MIRTAZAPINE 30 MG PO TBDP: 30.0000 mg | ORAL_TABLET | Freq: Every day | ORAL | 0 refills | Status: AC

## 2017-10-11 MED ORDER — TAMSULOSIN HCL 0.4 MG PO CAPS: 0.8000 mg | ORAL_CAPSULE | Freq: Every day | ORAL | Status: AC

## 2017-10-11 MED ORDER — ADULT MULTIVITAMIN W/MINERALS CH: 1.0000 | ORAL_TABLET | Freq: Every day | ORAL | Status: AC

## 2017-10-11 NOTE — Progress Notes (Signed)
Speech Language Pathology Discharge Summary  Patient Details  Name: Walter Bryant MRN: [id number] Date of Birth: 09/23/1930  Today's Date: 10/10/2017 SLP Individual Time:  - 1210-1240     Skilled Therapeutic Interventions:  Skilled ST services focused on swallow skills, cognitive skills and family education. SLP facilitated skilled observation of PO consumption of lunch tray dys 3 and thin via cup, pt required min A verba cues for chin tuck and small sips with min overt s/s aspiration, throat clear. SLP facilitated education of IMST exercise and answered all questions to satisfaction by pt and pt's wife. SP reassessed pt's cognition with MOCA blind, pt scored 14 out 30, 4 points lowers than on evaluation, likely due to medical changes resulting. SLP reviewed progress with speech intelligibility at sentence level and continue need for skilled ST services in speech, cognitive skills and swallow compensatory strategies, pt and wife stated agreement. Pt was left in room with call bell within reach and bed alaram set.ST reccomends to continuedd skilled ST services.      Patient has met 5 of 7 long term goals.  Patient to discharge at overall Mod;Min level.  Reasons goals not met:     Clinical Impression/Discharge Summary:   Pt made good and slow progress meeting 5 out 7 goals, discharging at min-mod A. Pt demonstrated increase in speech intelligibility at sentence level, however would continue to benefit from RMT, utilizing IMST device to increase breath support. Pt scored 14 out 22 on MOCA blind, 4 points lower then initial evaluation score, likely due to decline in cognitive ability due to medical changes. Pt's impairment in attention impacts recall of novel information, basic problem solving and carryover of swallow compensatory strategies. Pt continues to required min A verbal cues for swallow compensatory strategies with dys 3 and thin liquid requiring small bites/sips and chin tuck to reduce risk  of aspiration. Pt would continue to benefit from skilled ST services in order to maximize functional independence and reduce burden of care at skilled nursing facility requiring 24/hour supervision,  Caregiver Able to Provide Assistance: Yes  Type of Caregiver Assistance: Cognitive;Physical  Recommendation:  Skilled Nursing facility;24 hour supervision/assistance  Rationale for SLP Follow Up: Maximize functional communication;Maximize cognitive function and independence;Maximize swallowing safety   Equipment:     Reasons for discharge: Discharged from hospital   Patient/Family Agrees with Progress Made and Goals Achieved: Yes   Function:  Eating Eating   Modified Consistency Diet: Yes(dys 3 and thin) Eating Assist Level: Set up assist for;Supervision or verbal cues   Eating Set Up Assist For: Opening containers       Cognition Comprehension Comprehension assist level: Understands basic 75 - 89% of the time/ requires cueing 10 - 24% of the time  Expression   Expression assist level: Expresses basic 75 - 89% of the time/requires cueing 10 - 24% of the time. Needs helper to occlude trach/needs to repeat words.  Social Interaction Social Interaction assist level: Interacts appropriately 75 - 89% of the time - Needs redirection for appropriate language or to initiate interaction.  Problem Solving Problem solving assist level: Solves basic 50 - 74% of the time/requires cueing 25 - 49% of the time  Memory Memory assist level: Recognizes or recalls 50 - 74% of the time/requires cueing 25 - 49% of the time   Walter Bryant  [facility] 09/27/2017, 7:56 AM

## 2017-10-11 NOTE — Progress Notes (Signed)
Subjective/Complaints:  Again restless last night. Did better with foley in. Denies pain this morning  ROS: Limited due to cognitive/behavioral   Objective: Vital Signs: Blood pressure (!) 150/94, pulse 89, temperature 98.2 F (36.8 C), temperature source Oral, resp. rate 16, height 6\' 2"  (1.88 m), weight 98.1 kg (216 lb 4.3 oz), SpO2 96 %. No results found. No results found for this or any previous visit (from the past 72 hour(s)).   Constitutional: No distress . Vital signs reviewed. HEENT: EOMI, oral membranes moist Neck: supple Cardiovascular: RRR without murmur. No JVD    Respiratory: CTA Bilaterally without wheezes or rales. Normal effort    GI: BS +, non-tender, non-distended  Skin:   Other IV sites and nares CDI Neuro:  Alert, follows simple commands.  Decreased insight and awareness., Abnormal Sensory Reduced bilateral below ankles, Abnormal Motor 3-/5 RUE grip, 2- RUE Bicepts and Triceps and 2-  Moderately dysarthric Musc/Skel:  Bilateral AFOs  Psych: less anxiou stoday  Assessment/Plan: 1. 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 Physiatrist is providing close team supervision and 24 hour management of active medical problems listed below. Physiatrist and rehab team continue to assess barriers to discharge/monitor patient progress toward functional and medical goals. FIM: Function - Bathing Position: Bed Body parts bathed by patient: Right arm, Chest, Abdomen Body parts bathed by helper: Left arm, Left upper leg, Right lower leg, Left lower leg, Back, Front perineal area, Buttocks, Right upper leg Assist Level: 2 helpers(Bed level)  Function- Upper Body Dressing/Undressing What is the patient wearing?: Pull over shirt/dress Pull over shirt/dress - Perfomed by patient: Thread/unthread left sleeve, Put head through opening,  Thread/unthread right sleeve, Pull shirt over trunk Pull over shirt/dress - Perfomed by helper: Thread/unthread right sleeve, Pull shirt over trunk Assist Level: Supervision or verbal cues Function - Lower Body Dressing/Undressing What is the patient wearing?: Pants, Socks, Shoes, AFO Position: Bed Pants- Performed by helper: Thread/unthread right pants leg, Thread/unthread left pants leg, Pull pants up/down Socks - Performed by helper: Don/doff right sock, Don/doff left sock Shoes - Performed by helper: Don/doff right shoe, Don/doff left shoe, Fasten right, Fasten left AFO - Performed by helper: Don/doff right AFO, Don/doff left AFO TED Hose - Performed by helper: Don/doff right TED hose, Don/doff left TED hose Assist for footwear: Dependant Assist for lower body dressing: 2 Helpers  Function - Toileting Toileting activity did not occur: Safety/medical concerns Toileting steps completed by helper: Adjust clothing prior to toileting, Adjust clothing after toileting, Performs perineal hygiene Toileting Assistive Devices: Other (comment)(Bed level) Assist level: Two helpers  Function - Air cabin crew transfer activity did not occur: Safety/medical concerns Toilet transfer assistive device: Facilities manager lift: Garment/textile technologist level to toilet: 2 helpers Assist level from toilet: 2 helpers  Function - Chair/bed transfer Chair/bed transfer [place]: Lateral scoot Chair/bed transfer assist level: 2 helpers Chair/bed transfer assistive device: Sliding board Mechanical lift: [place] Chair/bed transfer details: Tactile cues for initiation, Tactile cues for sequencing, Tactile cues for weight shifting, Tactile cues for posture, Tactile cues for placement, Verbal cues for sequencing, Verbal cues for technique, Manual facilitation for placement, Manual facilitation for weight shifting  Function - Locomotion: Wheelchair Will patient use wheelchair at discharge?: Yes Type:  Manual Max wheelchair distance: 90 ft  Assist Level: Supervision or verbal cues Assist Level: Supervision or verbal cues Assist Level: Dependent (Pt equals 0%) Turns around,maneuvers to  table,bed, and toilet,negotiates 3% grade,maneuvers on rugs and over doorsills: No Function - Locomotion: Ambulation Ambulation activity did not occur: Safety/medical concerns Assistive device: Rail in hallway Max distance: 2 Assist level: 2 helpers Walk 10 feet activity did not occur: Safety/medical concerns Walk 50 feet with 2 turns activity did not occur: Safety/medical concerns Walk 150 feet activity did not occur: Safety/medical concerns Walk 10 feet on uneven surfaces activity did not occur: Safety/medical concerns  Function - Comprehension Comprehension: Auditory Comprehension assistive device: Hearing aids Comprehension assist level: Understands basic 75 - 89% of the time/ requires cueing 10 - 24% of the time  Function - Expression Expression: Verbal Expression assist level: Expresses basic 75 - 89% of the time/requires cueing 10 - 24% of the time. Needs helper to occlude trach/needs to repeat words.  Function - Social Interaction Social Interaction assist level: Interacts appropriately 75 - 89% of the time - Needs redirection for appropriate language or to initiate interaction.  Function - Problem Solving Problem solving assist level: Solves basic 50 - 74% of the time/requires cueing 25 - 49% of the time  Function - Memory Memory assistive device: Memory book Memory assist level: Recognizes or recalls 50 - 74% of the time/requires cueing 25 - 49% of the time Patient normally able to recall (first 3 days only): That he or she is in a hospital, Staff names and faces  Medical Problem List and Plan: 1.  Right side weakness with dysarthria secondary to multiple bilateral infarcts (left BG, tiny Right frontal and bilateral  occipital infarcts) consistent with cardiac emboli with history of  atrial fibrillation.Onset 5/29   SNF today  -follow up with PM&R in 4+ weeks  2.  DVT Prophylaxis/Anticoagulation: Eliquis 3. Pain Management/polyneuropathy: Tylenol as needed 4. Mood/sleep/confusion: remeron  -anxiety fluctuates,. Element of sundowning too  -should do a little better out of hospital  -hesitate adding anything new for acute anxiety given tolerance/history 5. Neuropsych: This patient is capable of making decisions on his own behalf. 6. Skin/Wound Care: Routine skin checks 7. Fluids/Electrolytes/Nutrition: BMET normal 6/4  -Appreciate dietary consult  8.  Dysphagia. Improved with good intake! 9.  History of atrial fibrillation.  Continue Eliquis 5mg  BID.  Cardiac rate controlled, on metoprolol PTA 10.  Hyperlipidemia.  Zocor on lipitor PTA 11.  History of polyneuropathy.  Patient uses bilateral AFO braces prior to admission.  No up with her deficits however 12.  Hx HTN lasix and Lisinopril PTA Vitals:   10/10/17 2010 09/24/2017 0413  BP: 133/86 (!) 150/94  Pulse: 83 89  Resp: 18 16  Temp: 98.6 F (37 C) 98.2 F (36.8 C)  SpO2: 97% 96%  BPs with fair control 13.  Hx of GERD-  Pepcid to 40mg  BID, added protonix 40mg  daily, improved 14.  Nausea without vomiting, question medication versus tube feeding versus constipation.  Appears to have improved partially 15.  Constipation improving per pt 16.  Urinary retention  likely multifactorial immobility and prob BPH,   flomax increased to home dose 0.8mg , added urecholine will titrate up to 25mg  TID, cath vol 500cc x 2 overnight/morning  -Urine culture positive enterococcus  completed Amoxil  -inserted foley given ongoing retention 17.  sleep wake cycle disruption, improved but far from perfect sleep with increased Remeron    Daughter who is [phone] (Days) 22 A FACE TO FACE EVALUATION WAS PERFORMED  Meredith Staggers 10/10/2017, 9:04 AM

## 2017-10-11 NOTE — Progress Notes (Signed)
Patient discharged via ambulance to SNF at 1110. Report called to [place]. Wife at bedside earlier and took most of belongings. Patient stable. All questions answered. Patient discharged with 1 bag containing his AFOs/shoes.

## 2017-10-14 DIAGNOSIS — I4891 Unspecified atrial fibrillation: Secondary | ICD-10-CM | POA: Diagnosis not present

## 2017-10-14 DIAGNOSIS — I6389 Other cerebral infarction: Secondary | ICD-10-CM | POA: Diagnosis not present

## 2017-10-14 DIAGNOSIS — F339 Major depressive disorder, recurrent, unspecified: Secondary | ICD-10-CM | POA: Diagnosis not present

## 2017-10-14 DIAGNOSIS — R339 Retention of urine, unspecified: Secondary | ICD-10-CM | POA: Diagnosis not present

## 2017-10-17 DIAGNOSIS — 419620001 Death: Secondary | SNOMED CT | POA: Diagnosis not present

## 2017-10-17 DEATH — deceased

## 2017-10-26 ENCOUNTER — Telehealth: Payer: Self-pay | Admitting: Adult Health

## 2017-10-26 NOTE — Telephone Encounter (Signed)
Pts wife Pamala Hurry called to inform the office of the pts passing on 6/30

## 2017-11-11 ENCOUNTER — Ambulatory Visit: Payer: Medicare Other | Admitting: Adult Health

## 2017-11-15 ENCOUNTER — Inpatient Hospital Stay: Payer: Medicare Other | Admitting: Physical Medicine & Rehabilitation

## 2017-11-15 ENCOUNTER — Ambulatory Visit: Payer: Medicare Other

## 2019-12-04 IMAGING — DX DG CHEST 2V
2 series · 2 of 2 positions shown · non-contrast
Comparison: None.

CLINICAL DATA: Upper respiratory infection. Shortness of breath. No
fever.

EXAM:
CHEST - 2 VIEW

[chest pa]
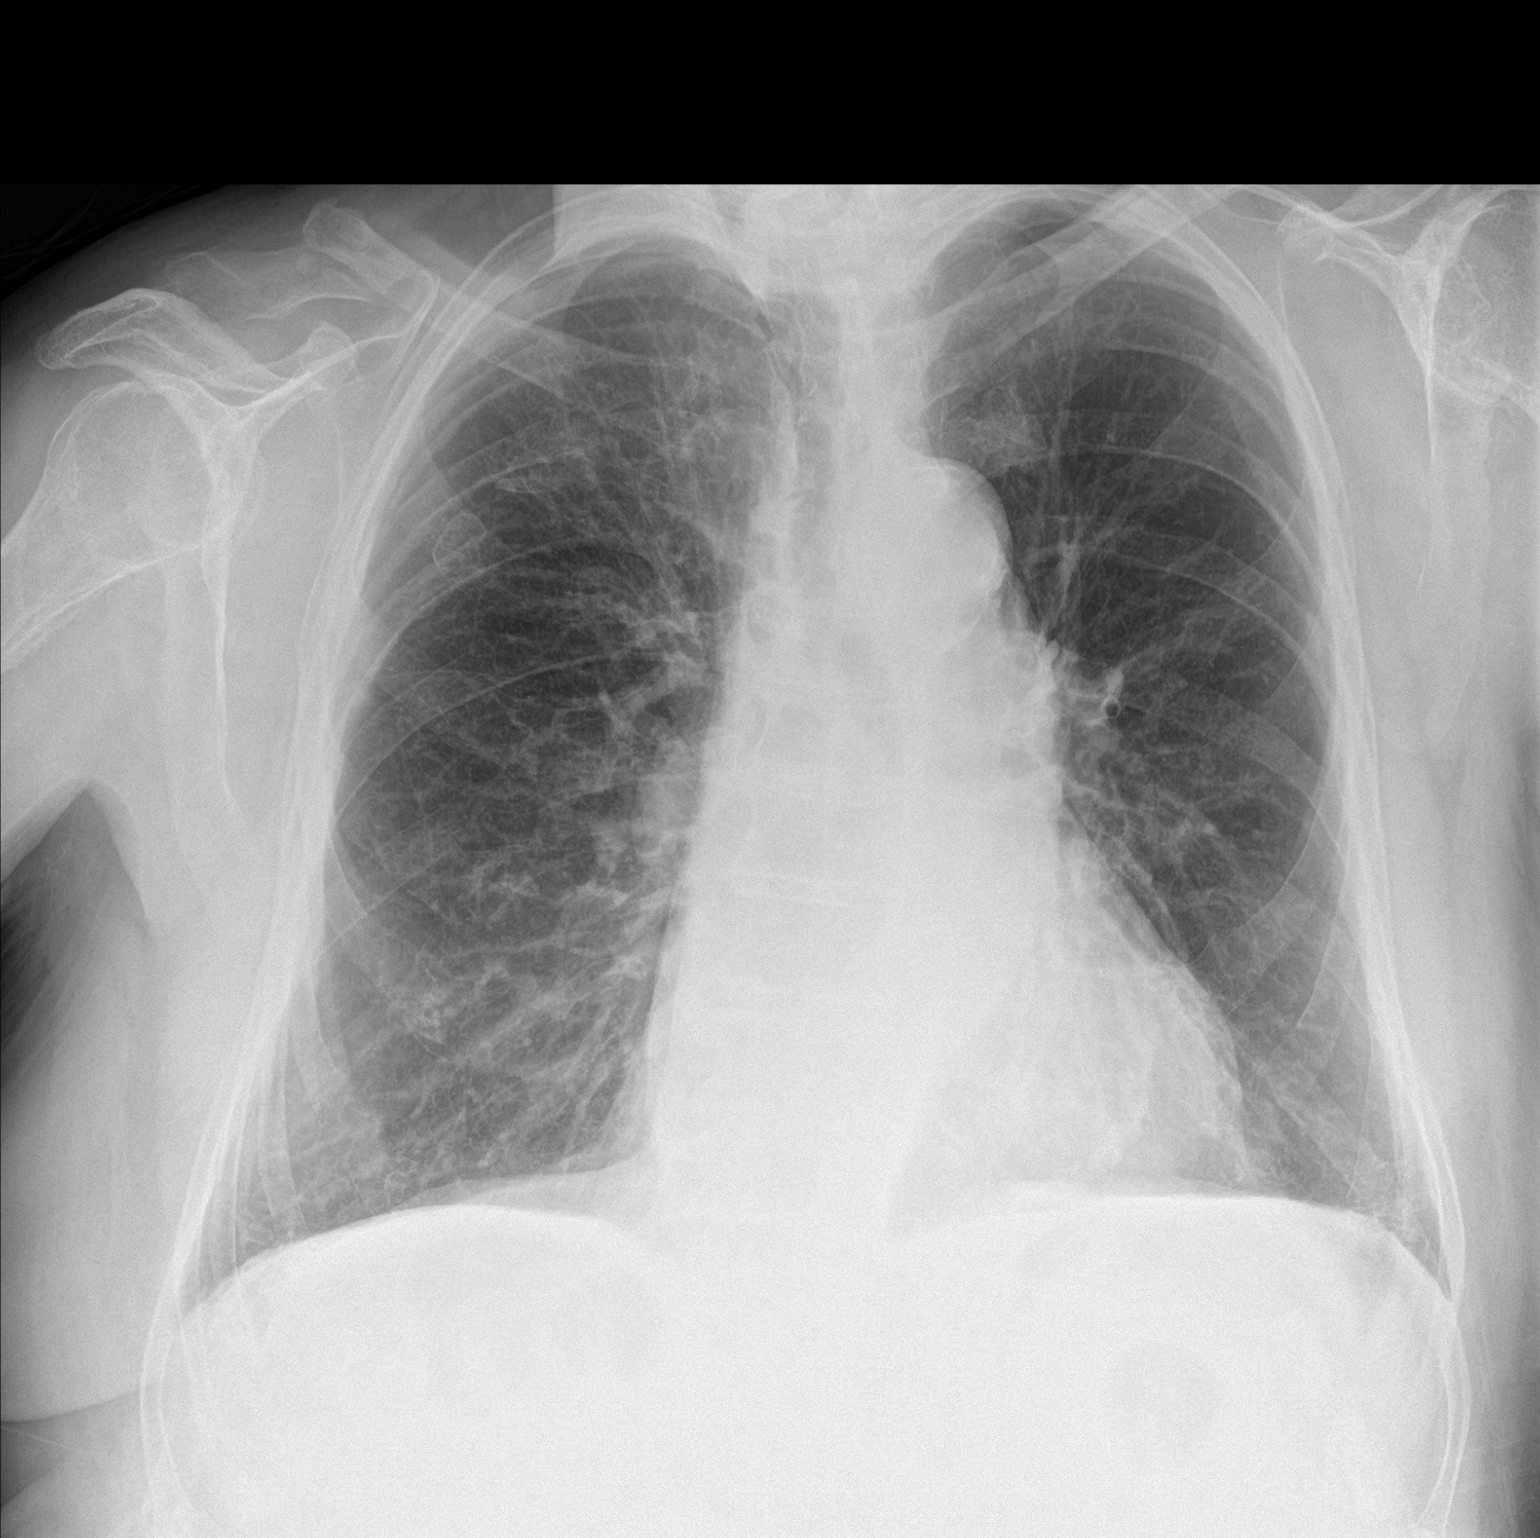

[chest lat]
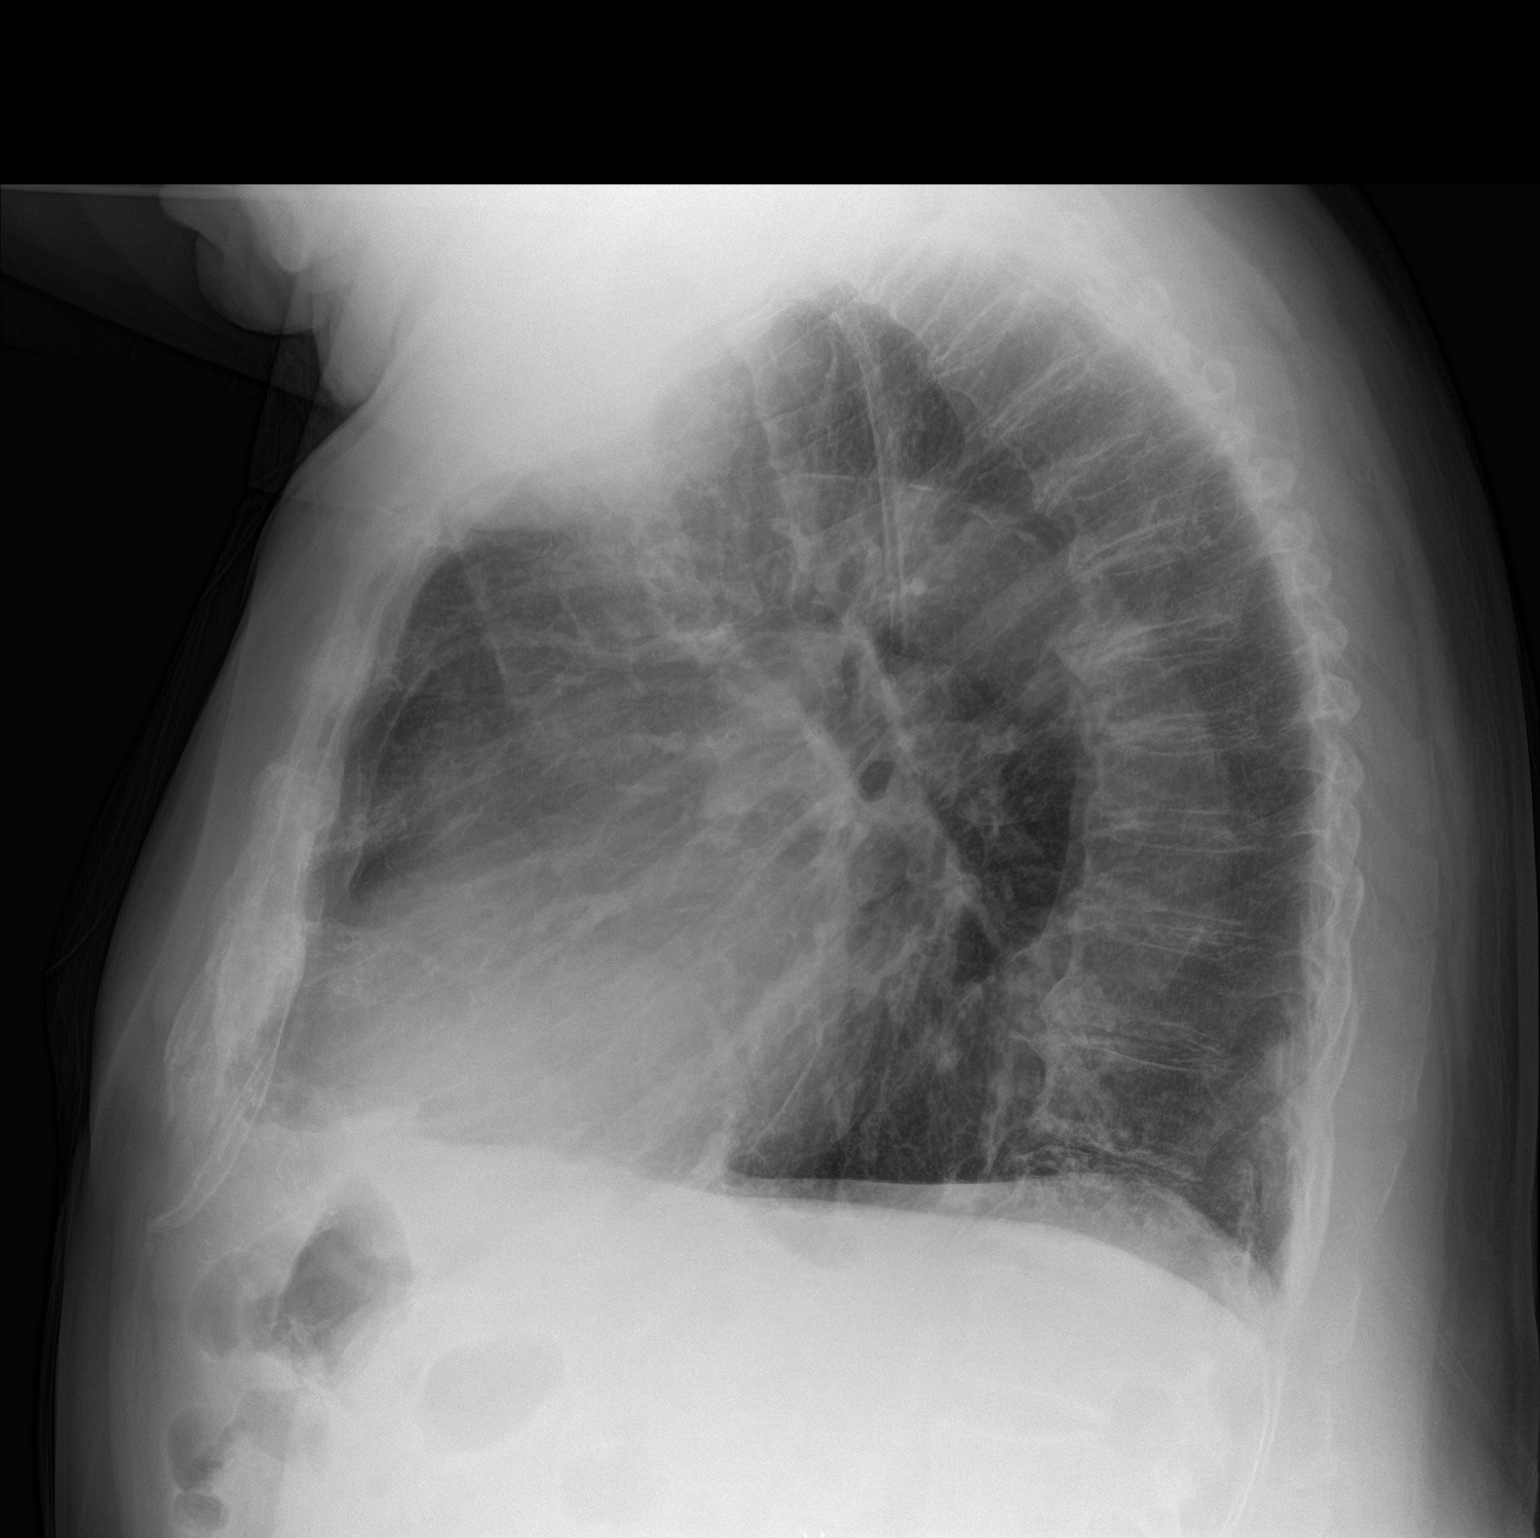

[2 of 2 positions shown; findings below may reference images not displayed]

FINDINGS: The heart size is borderline. The hila and mediastinum are normal.
No pneumothorax. No nodules or masses. No focal infiltrates
identified.
IMPRESSION: No active cardiopulmonary disease.

## 2019-12-06 IMAGING — CR DG CHEST 2V
2 series · 2 of 2 positions shown · non-contrast
Comparison: 08/13/2017 radiographs.

CLINICAL DATA: Persistent shortness of breath. Possible
dehydration.

EXAM:
CHEST - 2 VIEW

[w chest pa]
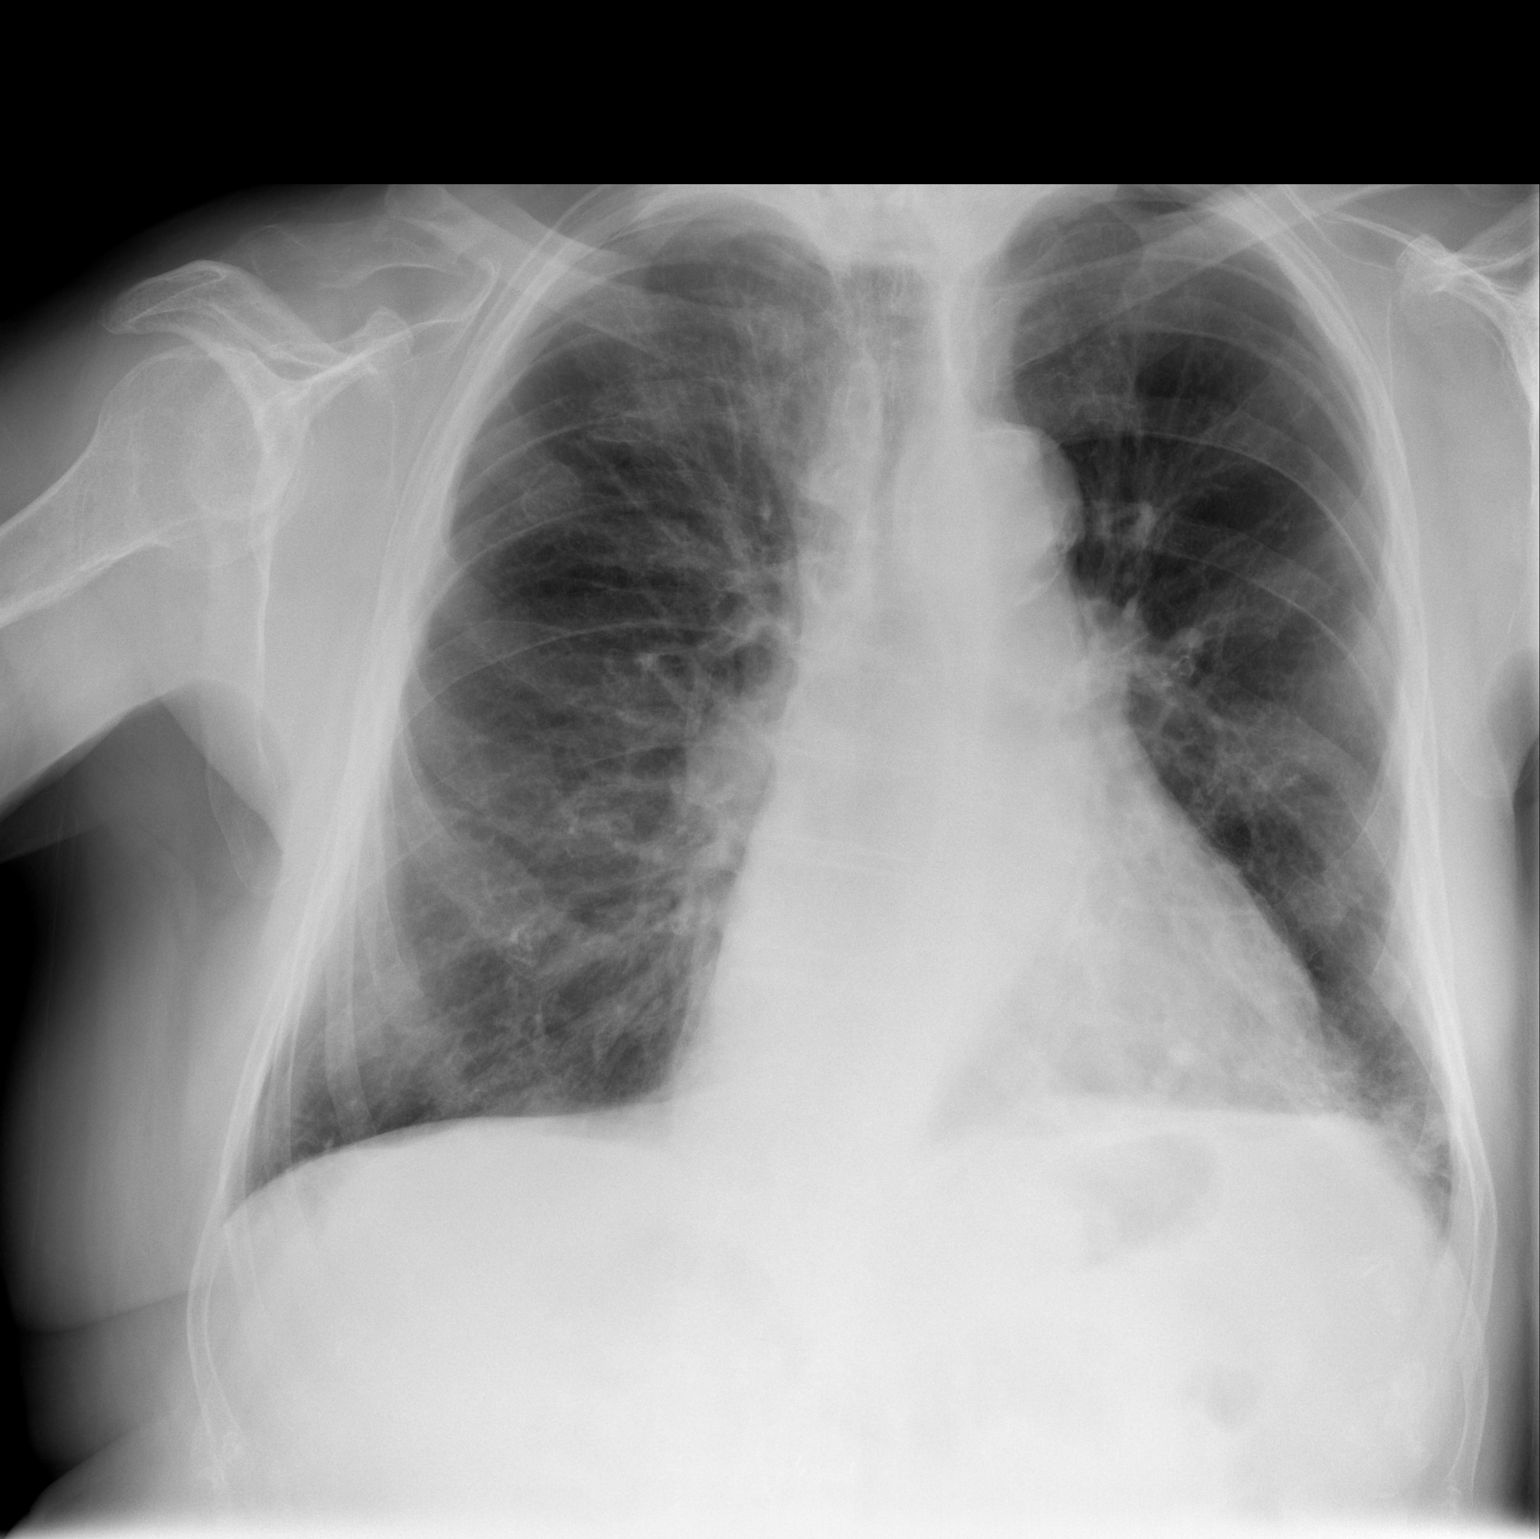

[w chest lat]
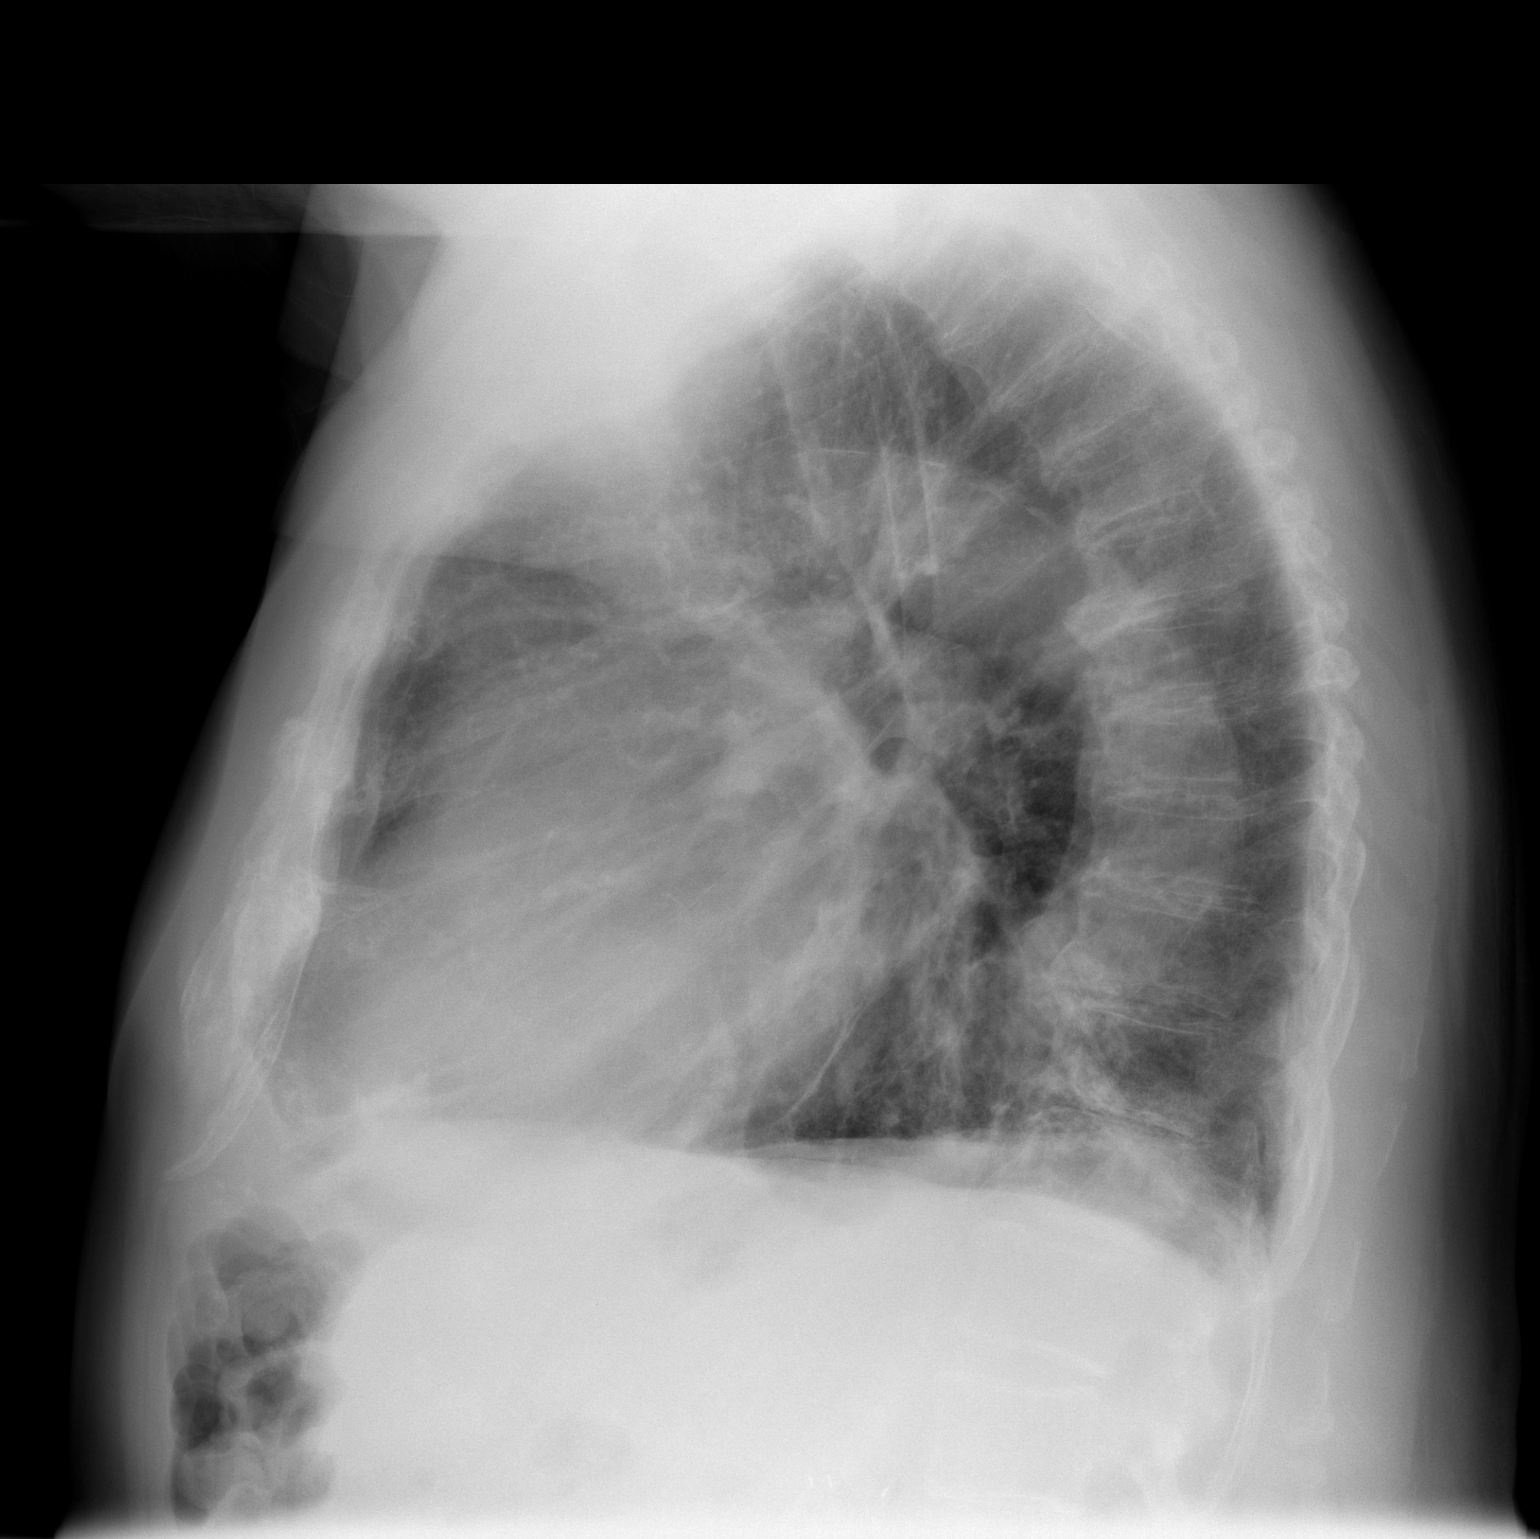

[2 of 2 positions shown; findings below may reference images not displayed]

FINDINGS: The heart size and mediastinal contours are stable. There is aortic
atherosclerosis. There is new patchy airspace disease at the left
lung base. The right lung is clear. There is no pleural effusion or
pneumothorax. Old right clavicle and rib fractures are noted. There
are degenerative changes throughout the spine.
IMPRESSION: New patchy left basilar airspace disease suspicious for early
pneumonia. Followup PA and lateral chest X-ray is recommended in 3-4
weeks following trial of antibiotic therapy to ensure resolution and
exclude underlying malignancy.

## 2020-01-05 IMAGING — CR DG CHEST 2V
2 series · 2 of 2 positions shown · non-contrast
Comparison: Chest radiograph performed 08/15/2017

CLINICAL DATA: Status post CVA. Recent pulmonary infiltrates.

EXAM:
CHEST - 2 VIEW

[chest lat]
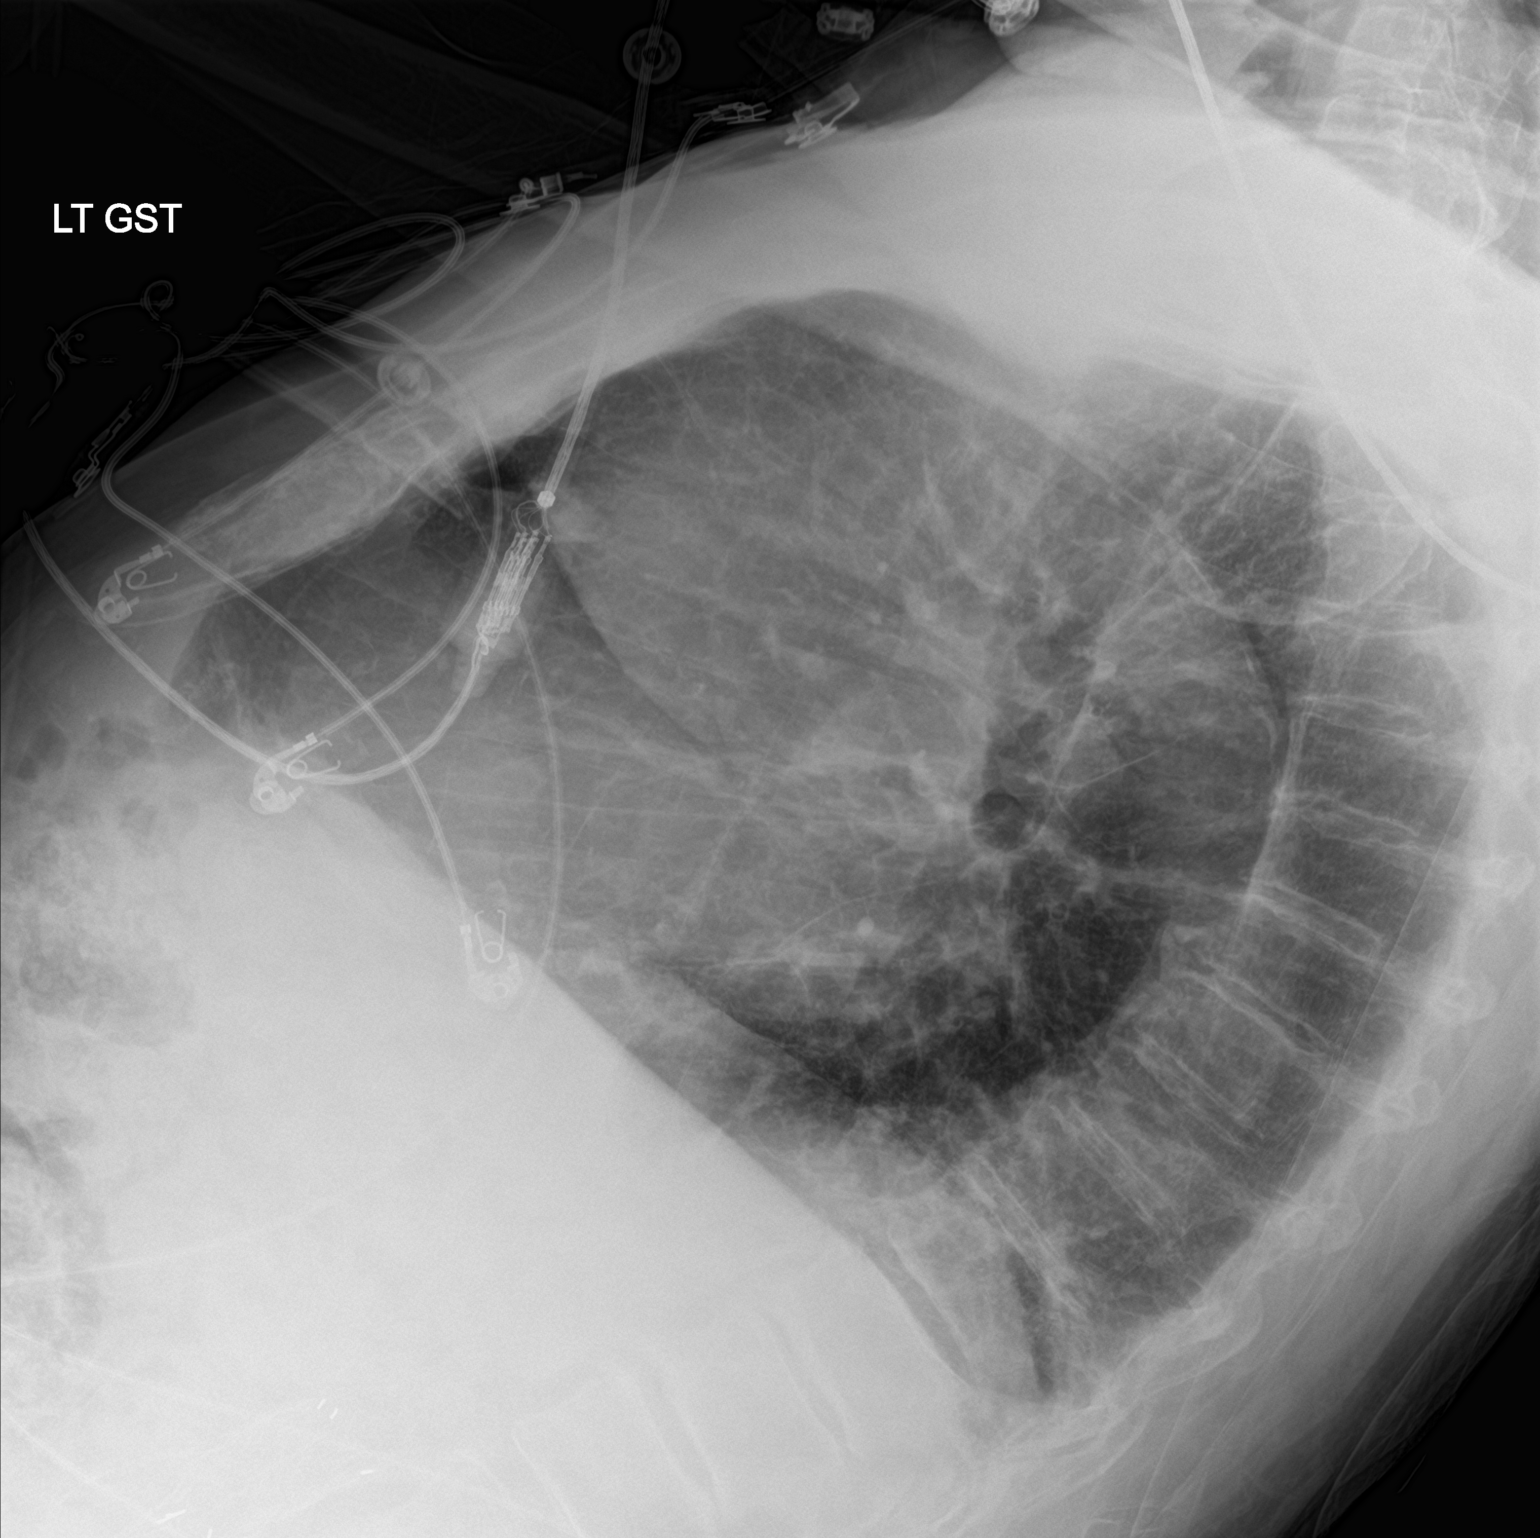

[chest ap]
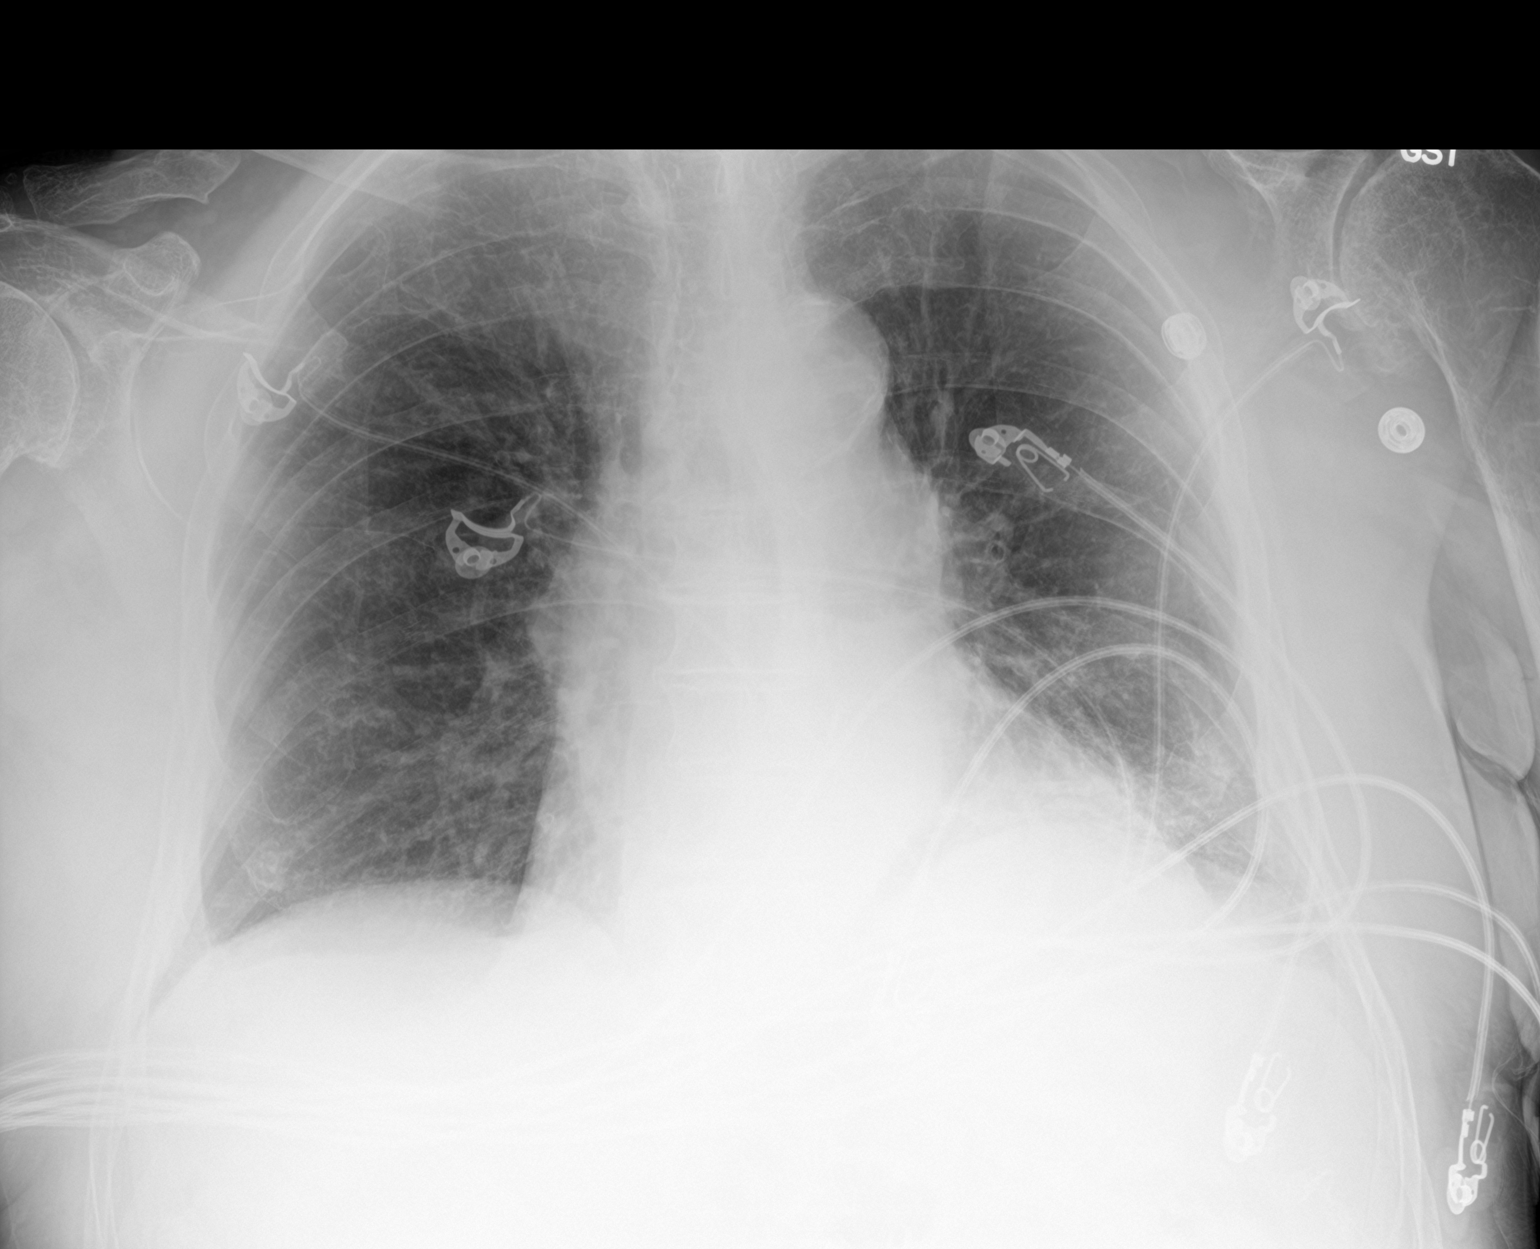

[2 of 2 positions shown; findings below may reference images not displayed]

FINDINGS: Worsening left basilar airspace opacity is concerning for recurrent
pneumonia. Mild vascular congestion is noted. Mild peribronchial
thickening is seen. No significant pleural effusion or pneumothorax
is identified.

The cardiomediastinal silhouette is borderline normal in size. No
acute osseous abnormalities are identified. Mild degenerative change
is noted at the left glenohumeral joint.
IMPRESSION: Worsening left basilar airspace opacity is concerning for recurrent
pneumonia. Mild vascular congestion noted.
